# Patient Record
Sex: Female | Born: 1950 | Race: White | Hispanic: No | Smoking: Current some day smoker
Health system: Southern US, Community
[De-identification: ages and names within clinical notes are randomized; demographics above are authoritative.]

## PROBLEM LIST (undated history)

## (undated) DIAGNOSIS — C801 Malignant (primary) neoplasm, unspecified: Secondary | ICD-10-CM

## (undated) DIAGNOSIS — T7840XA Allergy, unspecified, initial encounter: Secondary | ICD-10-CM

## (undated) DIAGNOSIS — K219 Gastro-esophageal reflux disease without esophagitis: Secondary | ICD-10-CM

## (undated) DIAGNOSIS — K5790 Diverticulosis of intestine, part unspecified, without perforation or abscess without bleeding: Secondary | ICD-10-CM

## (undated) DIAGNOSIS — K648 Other hemorrhoids: Secondary | ICD-10-CM

## (undated) DIAGNOSIS — J45909 Unspecified asthma, uncomplicated: Secondary | ICD-10-CM

## (undated) DIAGNOSIS — M1990 Unspecified osteoarthritis, unspecified site: Secondary | ICD-10-CM

## (undated) DIAGNOSIS — K644 Residual hemorrhoidal skin tags: Secondary | ICD-10-CM

## (undated) DIAGNOSIS — E079 Disorder of thyroid, unspecified: Secondary | ICD-10-CM

## (undated) DIAGNOSIS — I700 Atherosclerosis of aorta: Secondary | ICD-10-CM

## (undated) DIAGNOSIS — F329 Major depressive disorder, single episode, unspecified: Secondary | ICD-10-CM

## (undated) DIAGNOSIS — K449 Diaphragmatic hernia without obstruction or gangrene: Secondary | ICD-10-CM

## (undated) DIAGNOSIS — E785 Hyperlipidemia, unspecified: Secondary | ICD-10-CM

## (undated) DIAGNOSIS — Z5189 Encounter for other specified aftercare: Secondary | ICD-10-CM

## (undated) DIAGNOSIS — I10 Essential (primary) hypertension: Secondary | ICD-10-CM

## (undated) DIAGNOSIS — D126 Benign neoplasm of colon, unspecified: Secondary | ICD-10-CM

## (undated) DIAGNOSIS — F32A Depression, unspecified: Secondary | ICD-10-CM

## (undated) DIAGNOSIS — F419 Anxiety disorder, unspecified: Secondary | ICD-10-CM

## (undated) HISTORY — DX: Essential (primary) hypertension: I10

## (undated) HISTORY — DX: Gastro-esophageal reflux disease without esophagitis: K21.9

## (undated) HISTORY — DX: Unspecified osteoarthritis, unspecified site: M19.90

## (undated) HISTORY — DX: Other hemorrhoids: K64.8

## (undated) HISTORY — DX: Residual hemorrhoidal skin tags: K64.4

## (undated) HISTORY — DX: Malignant (primary) neoplasm, unspecified: C80.1

## (undated) HISTORY — DX: Diaphragmatic hernia without obstruction or gangrene: K44.9

## (undated) HISTORY — DX: Disorder of thyroid, unspecified: E07.9

## (undated) HISTORY — DX: Major depressive disorder, single episode, unspecified: F32.9

## (undated) HISTORY — DX: Allergy, unspecified, initial encounter: T78.40XA

## (undated) HISTORY — DX: Hyperlipidemia, unspecified: E78.5

## (undated) HISTORY — DX: Depression, unspecified: F32.A

## (undated) HISTORY — PX: APPENDECTOMY: SHX54

## (undated) HISTORY — DX: Encounter for other specified aftercare: Z51.89

## (undated) HISTORY — DX: Atherosclerosis of aorta: I70.0

## (undated) HISTORY — PX: OTHER SURGICAL HISTORY: SHX169

## (undated) HISTORY — DX: Benign neoplasm of colon, unspecified: D12.6

## (undated) HISTORY — DX: Unspecified asthma, uncomplicated: J45.909

## (undated) HISTORY — DX: Diverticulosis of intestine, part unspecified, without perforation or abscess without bleeding: K57.90

## (undated) HISTORY — DX: Anxiety disorder, unspecified: F41.9

---

## 1973-09-25 HISTORY — PX: CHOLECYSTECTOMY: SHX55

## 1981-09-25 HISTORY — PX: BREAST SURGERY: SHX581

## 1994-09-25 HISTORY — PX: NASAL SINUS SURGERY: SHX719

## 1996-09-25 HISTORY — PX: LIPOSUCTION: SHX10

## 2000-05-14 ENCOUNTER — Inpatient Hospital Stay (HOSPITAL_COMMUNITY): Admission: RE | Admit: 2000-05-14 | Discharge: 2000-05-31 | Payer: Self-pay | Admitting: Plastic Surgery

## 2000-05-14 ENCOUNTER — Encounter (INDEPENDENT_AMBULATORY_CARE_PROVIDER_SITE_OTHER): Payer: Self-pay | Admitting: *Deleted

## 2000-05-18 ENCOUNTER — Encounter: Payer: Self-pay | Admitting: Plastic Surgery

## 2000-05-19 ENCOUNTER — Encounter: Payer: Self-pay | Admitting: Pulmonary Disease

## 2000-05-20 ENCOUNTER — Encounter: Payer: Self-pay | Admitting: Pulmonary Disease

## 2000-05-20 ENCOUNTER — Encounter: Payer: Self-pay | Admitting: Plastic Surgery

## 2000-05-21 ENCOUNTER — Encounter: Payer: Self-pay | Admitting: Pulmonary Disease

## 2000-05-22 ENCOUNTER — Encounter: Payer: Self-pay | Admitting: Pulmonary Disease

## 2000-05-23 ENCOUNTER — Encounter: Payer: Self-pay | Admitting: Pulmonary Disease

## 2000-05-24 ENCOUNTER — Encounter: Payer: Self-pay | Admitting: Pulmonary Disease

## 2000-05-25 ENCOUNTER — Encounter: Payer: Self-pay | Admitting: Pulmonary Disease

## 2000-05-27 ENCOUNTER — Encounter: Payer: Self-pay | Admitting: Pulmonary Disease

## 2000-05-28 ENCOUNTER — Encounter: Payer: Self-pay | Admitting: Pulmonary Disease

## 2000-05-30 ENCOUNTER — Encounter: Payer: Self-pay | Admitting: Pulmonary Disease

## 2000-06-12 ENCOUNTER — Encounter: Admission: RE | Admit: 2000-06-12 | Discharge: 2000-09-03 | Payer: Self-pay | Admitting: Plastic Surgery

## 2000-08-14 ENCOUNTER — Other Ambulatory Visit: Admission: RE | Admit: 2000-08-14 | Discharge: 2000-08-14 | Payer: Self-pay | Admitting: Internal Medicine

## 2001-01-04 ENCOUNTER — Encounter: Payer: Self-pay | Admitting: Plastic Surgery

## 2001-01-04 ENCOUNTER — Ambulatory Visit (HOSPITAL_COMMUNITY): Admission: RE | Admit: 2001-01-04 | Discharge: 2001-01-04 | Payer: Self-pay | Admitting: Plastic Surgery

## 2001-08-14 ENCOUNTER — Ambulatory Visit (HOSPITAL_COMMUNITY): Admission: RE | Admit: 2001-08-14 | Discharge: 2001-08-14 | Payer: Self-pay | Admitting: Internal Medicine

## 2001-08-14 ENCOUNTER — Encounter: Payer: Self-pay | Admitting: Internal Medicine

## 2002-05-01 ENCOUNTER — Ambulatory Visit (HOSPITAL_COMMUNITY): Admission: RE | Admit: 2002-05-01 | Discharge: 2002-05-01 | Payer: Self-pay | Admitting: Internal Medicine

## 2002-05-01 ENCOUNTER — Encounter: Payer: Self-pay | Admitting: Internal Medicine

## 2002-05-30 ENCOUNTER — Emergency Department (HOSPITAL_COMMUNITY): Admission: EM | Admit: 2002-05-30 | Discharge: 2002-05-30 | Payer: Self-pay

## 2003-12-12 ENCOUNTER — Emergency Department (HOSPITAL_COMMUNITY): Admission: EM | Admit: 2003-12-12 | Discharge: 2003-12-12 | Payer: Self-pay | Admitting: Emergency Medicine

## 2006-06-29 ENCOUNTER — Emergency Department (HOSPITAL_COMMUNITY): Admission: EM | Admit: 2006-06-29 | Discharge: 2006-06-29 | Payer: Self-pay | Admitting: Emergency Medicine

## 2006-07-10 ENCOUNTER — Other Ambulatory Visit: Admission: RE | Admit: 2006-07-10 | Discharge: 2006-07-10 | Payer: Self-pay | Admitting: Internal Medicine

## 2006-09-25 HISTORY — PX: BREAST RECONSTRUCTION: SHX9

## 2009-06-22 ENCOUNTER — Emergency Department (HOSPITAL_COMMUNITY): Admission: EM | Admit: 2009-06-22 | Discharge: 2009-06-22 | Payer: Self-pay | Admitting: Emergency Medicine

## 2010-03-31 ENCOUNTER — Encounter: Admission: RE | Admit: 2010-03-31 | Discharge: 2010-03-31 | Payer: Self-pay | Admitting: Internal Medicine

## 2010-12-30 LAB — COMPREHENSIVE METABOLIC PANEL
ALT: 22 U/L (ref 0–35)
AST: 22 U/L (ref 0–37)
Albumin: 3.5 g/dL (ref 3.5–5.2)
Alkaline Phosphatase: 79 U/L (ref 39–117)
BUN: 12 mg/dL (ref 6–23)
CO2: 34 mEq/L — ABNORMAL HIGH (ref 19–32)
Calcium: 9 mg/dL (ref 8.4–10.5)
Chloride: 97 mEq/L (ref 96–112)
Creatinine, Ser: 0.62 mg/dL (ref 0.4–1.2)
GFR calc Af Amer: >60 mL/min (ref >60)
GFR calc non Af Amer: >60 mL/min (ref >60)
Glucose, Bld: 73 mg/dL (ref 70–99)
Potassium: 3 mEq/L — ABNORMAL LOW (ref 3.5–5.1)
Sodium: 139 mEq/L (ref 135–145)
Total Bilirubin: 0.6 mg/dL (ref 0.3–1.2)
Total Protein: 6.8 g/dL (ref 6.0–8.3)

## 2010-12-30 LAB — DIFFERENTIAL
Basophils Absolute: 0 10*3/uL (ref 0.0–0.1)
Basophils Relative: 0 % (ref 0–1)
Eosinophils Absolute: 0.1 10*3/uL (ref 0.0–0.7)
Eosinophils Relative: 2 % (ref 0–5)
Lymphocytes Relative: 40 % (ref 12–46)
Lymphs Abs: 2.8 10*3/uL (ref 0.7–4.0)
Monocytes Absolute: 0.5 10*3/uL (ref 0.1–1.0)
Monocytes Relative: 7 % (ref 3–12)
Neutro Abs: 3.5 10*3/uL (ref 1.7–7.7)
Neutrophils Relative %: 51 % (ref 43–77)

## 2010-12-30 LAB — CK TOTAL AND CKMB (NOT AT ARMC)
CK, MB: 2.2 ng/mL (ref 0.3–4.0)
Relative Index: INVALID (ref 0.0–2.5)
Total CK: 91 U/L (ref 7–177)

## 2010-12-30 LAB — CBC
HCT: 39.3 % (ref 36.0–46.0)
Hemoglobin: 13.3 g/dL (ref 12.0–15.0)
MCHC: 33.8 g/dL (ref 30.0–36.0)
MCV: 91.2 fL (ref 78.0–100.0)
Platelets: 363 10*3/uL (ref 150–400)
RBC: 4.31 MIL/uL (ref 3.87–5.11)
RDW: 13.9 % (ref 11.5–15.5)
WBC: 6.9 10*3/uL (ref 4.0–10.5)

## 2010-12-30 LAB — TROPONIN I: Troponin I: <0.01 ng/mL (ref 0.00–0.06)

## 2010-12-30 LAB — D-DIMER, QUANTITATIVE: D-Dimer, Quant: 0.31 ug/mL-FEU (ref 0.00–0.48)

## 2011-02-07 ENCOUNTER — Other Ambulatory Visit (HOSPITAL_COMMUNITY)
Admission: RE | Admit: 2011-02-07 | Discharge: 2011-02-07 | Disposition: A | Discharge status: Home / Self Care | Payer: Medicare Other | Source: Ambulatory Visit | Attending: Internal Medicine | Admitting: Internal Medicine

## 2011-02-07 ENCOUNTER — Other Ambulatory Visit: Payer: Self-pay | Admitting: Internal Medicine

## 2011-02-07 DIAGNOSIS — R87810 Cervical high risk human papillomavirus (HPV) DNA test positive: Secondary | ICD-10-CM | POA: Insufficient documentation

## 2011-02-07 DIAGNOSIS — Z124 Encounter for screening for malignant neoplasm of cervix: Secondary | ICD-10-CM | POA: Insufficient documentation

## 2011-02-10 NOTE — Op Note (Signed)
[place]  Patient:    Margaret Nelson, Margaret Nelson                       MRN: [id number] Proc. Date: 05/14/00 Adm. Date:  [id number] Attending:  Chapman Moss                           Operative Report  PREOPERATIVE DIAGNOSIS: 1. Mechanical complication of a left breast implant. 2. Acquired absence of the left breast.  POSTOPERATIVE DIAGNOSIS: 1. Mechanical complication of a left breast implant. 2. Acquired absence of the left breast.  OPERATION PERFORMED: 1. Capsulectomy, left chest. 2. Left breast reconstruction using a latissimus dorsi myocutaneous flap as    well as a saline filled implant.  SURGEON:  Teena Irani. Odis Luster, [name]  ASSISTANT:  Janet Berlin. Dan Humphreys, [name]  ANESTHESIA:  General.  ESTIMATED BLOOD LOSS:  100 cc.  DRAINS:  Two Blakes, one chest donor site and one left chest anteriorly.  INDICATIONS FOR PROCEDURE:  This 60 year old woman was referred by another plastic surgeon, who saw her and felt that she had an urgent situation secondary to an implant that was almost exposed.  The patient had undergone a subcutaneous mastectomy approximately 20 years ago and has had multiple chest surgeries for reconstruction.  She had done well since the early 1990s but noticed a bluish discoloration of the skin on the left side.  She saw her physician who referred her to a plastic surgeon, who felt that it was an impending implant extrusion.  She was referred for urgent management.  On examination she was, indeed, found to have a very thin skin cover over an implant. There did not appear to be any muscle cover and minimal subcutaneous tissue cover at that point.  The feeling was that the options would be be to just simply remove her implants, or to try to salvage this side with an urgent capsulectomy and breast reconstruction using a latissimus myocutaneous flap and an implant.  This was discussed with her at length.  She elected to try to salvage  the reconstruction using the latissimus muscle transfer.  She understood the location of the scar, the magnitude of the surgery, the risks and possible complications including tissue loss, flap loss, wound healing problems and the fact that she was at significant increased risk because of her smoking.  The flap breast reconstruction consent form was discussed with her in detail as was the implant consent form.  She understood all of this and wished to proceed.  DESCRIPTION OF PROCEDURE:  The patient was taken to the operating room.  After successful induction of general anesthesia, she was placed in right lateral decubitus position on the bean bag with axillary roll and padding of her knees, ankles and arms.  She was then prepped with Betadine and draped with sterile drapes.  The left chest was approached first by taking out a portion of the skin which was extremely thin and damaged by the underling implant. This having been excised, the implant was then removed. This was a Development worker, international aid silicone gel saline type of implant.  A capsulectomy was then performed as needed both anteriorly as well as superiorly.  Laterally and inferiorly some of the capsule was left in place in order to hold the implant in position. Some capsule was left underneath the nipple itself in order to try to preserve the nipple and avoid nipple loss if possible.  Thorough irrigation with saline, meticulous hemostasis with electrocautery.  The subcutaneous tunnel was then started towards the back again using electrocautery.  This was above the level of the muscle.  Attention was then directed to the left back.  The oblique skin panel had been sketched already to match the skin defect.  This incision was made and dissection carried through the subcutaneous tissues to the underlying latissimus muscle using electrocautery.  The dissection was then performed anteriorly and posteriorly to the limits of the muscle in both direction.   The anterior border having been identified, the dissection was then carried deep to the latissimus muscle.  The latissimus muscle was then dissected free distally and then was divided distally.  The total flap involved the proximal two thirds of the muscle since this was felt to be the more reliable portion of the flap based on the thoracodorsal vessels.  The flap was freed along its posterior origin and lumbar penetrating vessels were ligated with Ligaclips.  Dissected then up the posterior border of the muscle and then lifting the muscle from underneath taking great care to avoid damage to the underlying pedicle.  The serratus branches were actually left intact and were not divided.  The flap was then easily passed through the subcutaneous tunnel to the chest defect where it was temporarily secured using skin staples.  The flap had excellent color, excellent capillary refill with some bright red bleeding around the periphery of the skin paddle.  The donor defect was then thoroughly irrigated with saline.  Meticulous hemostasis was achieved with electrocautery.  A Blake drain was positioned and brought out through a separate stab wound inferiorly as well as anteriorly and secured with a 3-0 Prolene suture.  The closure, 2-0 and 3-0 Vicryl interrupted inverted deep sutures and a running 3-0 Monocryl subcuticular suture.  Steri-Strips and a Tegaderm sterile dressing were then applied after placing a few sterile 4 x 4 gauzes over the wound.  The patient was then turned to a supine position.  She was prepped with Betadine and draped with sterile drapes.  The right breast was also prepped into the field for comparison.  Meticulous hemostasis with electrocautery.  I inspected the pocket.  The pocket was made a little larger superiorly.  The muscle flap insetting 3-0 Vicryl interrupted simple sutures along the inferior border and some were preplaced along the superior border to the  pectoralis edge of the pectoralis muscle.  Again the skin paddle continued to have excellent color and excellent capillary refill.  After thoroughly cleaning  gloves, the implant was then prepared.  This was a McGhan saline filled, round, smooth wall implant.  The style 68, size 650 to 700 cc.  The lot #[id number].  The air was removed.  A total of 450 cc of sterile saline placed using the closed injection system.  The implant was then positioned.  The implant was then inflated to a total of 650 cc of sterile saline using a closed system.  The previously placed sutures which had been left untied along the superior border were then tied securely thus tucking the latissimus muscle up to the border of the pectoralis muscle.  The remainder of the closure medially using 3-0 Vicryl sutures, horizontal mattress, again taking  great care to avoid damage to the underlying implant.  Irrigation with saline and the skin paddle inset with 3-0 Vicryl interrupted inverted deep sutures.  Skin paddle continued to have excellent color and excellent capillary refill.  The Steri-Strips, dry  sterile dressings were lightly applied.  She was transported to the recovery room in stable condition having tolerated the procedure well. D:  05/14/00 TD:  05/15/00 Job: [id number]

## 2011-02-10 NOTE — Consult Note (Signed)
[place]  Patient:    Margaret Nelson, Margaret Nelson                       MRN: [id number] Proc. Date: 05/18/00 Adm. Date:  [id number] Attending:  Chapman Moss CC:         Teena Irani. Odis Luster, [name]   Consultation Report  DATE OF BIRTH:  Sep 15, 1951  REFERRING PHYSICIAN:  Teena Irani. Odis Luster, [name]  REASON FOR CONSULTATION:  Evaluation of bilateral pneumonia.  HISTORY OF PRESENT ILLNESS:  This 60 year old Caucasian female who was admitted on August 20 for breast reconstruction after mechanical complications of left breast prosthesis. The patient underwent left capsulectomy with left breast reconstruction and latissimus flap implant by Teena Irani. Odis Luster, [name] on the 20th. Per the anesthesia notes, the patient underwent two attempts at intubation via direct visualization, but no aspiration was reported. Today, the patient was to start ambulation after her surgery, and she stated she felt very weak and dyspneic and could not go on. O2 saturations on room air were checked, and these were 88%.  The patient had a chest x-ray done today which showed bilateral infiltrates consistent with a pneumonia. Last evening, she had spiked a temperature to 102.7. Apparently, the patient denies any dyspnea. Has occasional bouts of being "wheezy." She does have difficulties with cough which is nonproductive, though she feels she has some congestion "deep in her lungs."  The patient states that she is a smoker. Smokes approximately a half of a pack of cigarettes per day. Has done so for many years. She quit two weeks prior to the surgery. She is known to have difficulties with recurrent bronchitis throughout the year. Normally, does not use inhalers, except when she has a bout of bronchitis.  PAST MEDICAL HISTORY:  Remarkable for "fluid retention" for which the patient takes amiloride. The patient also has a history of anxiety and depression for which she is on Prozac. She  also has a history of gastroesophageal reflux for which she is on Prilosec. She has had numerous breast surgeries due to dense fibroid tissue noted in the past. The patient denies any other chronic medical illnesses. She does have the episodes of recurrent bronchitis. She states that she was "sickly" as a child and has asthma.  SOCIAL HISTORY:  She is divorced. Smokes half of a pack of cigarettes per day. Has done so for many years. She has no significant occupational exposure and works as a Production designer, theatre/television/film for a [employer], broadcasting/film/video.  ALLERGIES:  CODEINE and PENICILLIN which cause her to have hives and/or swelling.  CURRENT MEDICATIONS:  Clindamycin, amiloride, Protonix, Prozac, and MS-Contin for pain. In addition, the patient is on p.r.n. Phenergan and p.r.n. laxative of choice.  REVIEW OF SYSTEMS:  As noted above. Only complaint patient has other than that is not being able to sleep well at night while in the hospital.  PHYSICAL EXAMINATION:  GENERAL:  Well-developed, well-nourished, Caucasian female who is in no acute distress. She is speaking sentences completely and without having to pause for a breath. She does not appear toxic.  VITAL SIGNS:  Temperature is 100.2, tmax was 102.7 last evening; pulse is 108; blood pressure is 87/43, of note is the patient runs low blood pressures noted throughout her vitals; respirations are 18; O2 saturations on 3 L nasal cannula are 93%.  HEENT:  Unremarkable.  NECK:  Supple. No adenopathy noted.  LUNGS:  Bibasilar crackles which are  dense and show early consolidation. In addition, the patient has high-pitched wheezes on occasion.  CARDIAC:  Slightly tachycardic but regular rate.  ABDOMEN:  Benign.  CHEST:  Obvious postop changes from the breast reconstruction done earlier. The patient also has a bandage over the latissimus area.  EXTREMITIES:  No edema. The patient has signs of an infiltrated IV on the right  hand.  LABORATORY DATA:  No labs obtained today.  Chest x-ray shows bilateral infiltrates consistent with pneumonitis.  IMPRESSION: 1. Bilateral pneumonitis postoperative in patient with known smoking history.    This is a known complication postoperatively in smokers, though the patient    quit two weeks prior to surgery. The ideal time for better mucociliary    clearance is eight weeks of discontinuation prior to surgery. 2. Bronchospasm related to #1. Possibly also related to chronic bronchitis. 3. Chronic bronchitis by history.  PLAN: 1. Continue supplemental O2. 2. Continue pulmonary toilet. Will adjust nebulizers to albuterol 2.5 mg via    hand-held nebulizers q.6h. around the clock. We will discontinue Atrovent    at present as this may dry her secretions even further, and we want her to    expectorate. I will also use flutter valve after each nebulizer treatment. 3. I will add Tequin to her current antibiotic regimen for better coverage,    particularly in view of the fact that she is allergic to penicillin, and we    need to avoid this type of antibiotic. 4. Follow oxygen saturations, as well as chest x-rays and also follow patient    for clinical improvement or worsening. 5. We will discontinue amiloride for present and try to encourage p.o. fluids    as the patient needs to expectorate. 6. Add a mucolytic agent in the form of Humibid L.A. two tablets p.o. b.i.d.    with meals. 7. The patient does have some evidence of bronchospasm. Will add Solu-Medrol    x 6 doses, as well as use nebulizer treatments. 8. Further plans pending patients response to the above.  Thank you very much for the consult. We will continue to follow with you and monitor the patients progress to the above treatment. DD:  05/18/00 TD:  05/19/00 Job: [id number]

## 2011-02-10 NOTE — Discharge Summary (Signed)
[place]  Patient:    Margaret Nelson, Margaret Nelson                       MRN: [id number] Adm. Date:  [id number] Disc. Date: [id number] Attending:  Chapman Moss                           Discharge Summary  FINAL DIAGNOSIS:  Mechanical complication of a left breast implant with acquired absence of the left breast, and a bilateral pneumonia with adult respiratory distress syndrome.  PROCEDURE PERFORMED: 1. Capsulectomy, left breast. 2. Left breast reconstruction using a latissimus dorsi myocutaneous flap, as    well as a saline filled implant.  HISTORY OF PRESENT ILLNESS:  A 60 year old woman was referred by another plastic surgeon for a fairly urgent situation secondary to a left breast implant placed for a left breast reconstruction that was almost exposed.  Her skin had become paper thin over the implant.  The implant was clearly visible just beneath a very thin layer of dermis and epidermis.  The situation was discussed with the patient who did wish to proceed with reconstruction.  The option presented to her was to remove the implants on both sides and just leave them out, or have a fairly extensive reconstruction procedure using a left latissimus dorsi myocutaneous flap followed by placement of implant underneath.  She elected to have the reconstruction, rather then just simply removing her implants.  The nature of this procedure, the risks, and possible complications were discussed with her in great detail, and she understood all of this and wished to proceed.  She understood that she was at increased risk for tissue loss, as well as complications due to her smoking history.  For further details of her history and physical, please see the chart.  HOSPITAL COURSE:  On admission she was taken to surgery at which time capsulectomy left chest and left myocutaneous latissimus dorsi flap was placed and implants were performed.  She tolerated that  procedure well. Postoperatively, she did well initially.  She was afebrile for a couple of days, but then several days after the procedure she did have a significant temperature spike.  This was concerning, and blood cultures were taken, as well as sputum cultures, all of which were negative.  She continued to have difficulties, and was having a great deal of difficulty coughing up her secretions, and had the subjective feeling that she was short of breath.  A pulmonary consultation was obtained.  Her flap continued to look very healthy, but the concern at this point was the significant amount of trouble she was having with mobilizing secretions.  This was on the fourth postoperative day. A pulmonary consultation was obtained.  The chest x-ray showed some bilateral infiltrates consistent with a possible pneumonia, and given the temperature spike of 102.7, this was felt to be a possibility.  At this point, on May 18, 2000, the fourth postoperative day, she was denying any dyspnea.  She felt that she had congestion deep in her lungs.  The impression on the part of the pulmonary service was that she had bilateral pneumonitis postoperatively with some bronchospasm and chronic bronchitis by history, but really no evidence of any established pneumonia at this point.  She was continued on supplemental O2, continued on vigorous pulmonary hygiene, continued on following oxygen saturations.  Solu-Medrol was also begun.  At this point, the pulmonary service  was managing her care.  The following day she continued to have some further worsening in terms of her overall pulmonary status.  She continued to feel as though she was not coughing up enough of her secretions.  She had some desaturation during the night, and O2 was being delivered via aerosol mask.  She was transferred to the step-down unit.  She was started on Cleocin and Tequin for the time being, with the idea that she might very well have  pneumonia.  She continued on a very complicated course.  Her respiratory distress worsened by the following day, May 20, 2000, and she was transferred to the intensive care unit.  Her picture had become increasingly one of adult respiratory distress syndrome at this point.  It was felt that it was possibly due to pneumonia, although it was not documented.  She was started on the concominate ______ protein C study, and she responded to this very well.  She was also put on the rotating bed as well for secretions.  She was intubated and was in the intensive care unit for close to a week.  Finally, she began to improve, and gradually did respond very nicely.  She was transferred to the subacute unit and continued to make steady progress.  Throughout this, her muscle flap maintained excellent color and capillary refill, and did survive this very difficult clinical course.  Also donor site in her back also continued to do very well.  Drains were all removed.  Gradually she continued to make progress, and her pulmonary physicians felt that she could be discharged.  A CT scan was performed prior to discharge, since she apparently had a little bit of confusion several days prior to discharge.  This had improved greatly at the time of the CT scan, but nevertheless CT scan was ordered by the pulmonary service.  Apparently, this was negative.  DISPOSITION:  She was off all antibiotics at this point.  She was on pulmonary care per the pulmonary service.  She was encouraged not to smoke. Arrangements were made for outpatient physical therapy to help with strengthening, range of motion, and rehabilitation.  She would be followed up in my office in two weeks, and followed up by pulmonary service per their instructions. DD:  06/22/00 TD:  06/22/00 Job: [id number]

## 2011-12-01 DIAGNOSIS — J0100 Acute maxillary sinusitis, unspecified: Secondary | ICD-10-CM | POA: Diagnosis not present

## 2011-12-15 DIAGNOSIS — K644 Residual hemorrhoidal skin tags: Secondary | ICD-10-CM | POA: Diagnosis not present

## 2011-12-15 DIAGNOSIS — W458XXA Other foreign body or object entering through skin, initial encounter: Secondary | ICD-10-CM | POA: Diagnosis not present

## 2011-12-15 DIAGNOSIS — R5381 Other malaise: Secondary | ICD-10-CM | POA: Diagnosis not present

## 2011-12-15 DIAGNOSIS — E039 Hypothyroidism, unspecified: Secondary | ICD-10-CM | POA: Diagnosis not present

## 2011-12-15 DIAGNOSIS — R5383 Other fatigue: Secondary | ICD-10-CM | POA: Diagnosis not present

## 2012-01-11 DIAGNOSIS — I10 Essential (primary) hypertension: Secondary | ICD-10-CM | POA: Diagnosis not present

## 2012-01-11 DIAGNOSIS — J209 Acute bronchitis, unspecified: Secondary | ICD-10-CM | POA: Diagnosis not present

## 2012-02-05 DIAGNOSIS — J40 Bronchitis, not specified as acute or chronic: Secondary | ICD-10-CM | POA: Diagnosis not present

## 2012-02-08 DIAGNOSIS — I10 Essential (primary) hypertension: Secondary | ICD-10-CM | POA: Diagnosis not present

## 2012-02-08 DIAGNOSIS — R7309 Other abnormal glucose: Secondary | ICD-10-CM | POA: Diagnosis not present

## 2012-02-08 DIAGNOSIS — Z1212 Encounter for screening for malignant neoplasm of rectum: Secondary | ICD-10-CM | POA: Diagnosis not present

## 2012-02-08 DIAGNOSIS — Z79899 Other long term (current) drug therapy: Secondary | ICD-10-CM | POA: Diagnosis not present

## 2012-02-08 DIAGNOSIS — N3000 Acute cystitis without hematuria: Secondary | ICD-10-CM | POA: Diagnosis not present

## 2012-02-08 DIAGNOSIS — E782 Mixed hyperlipidemia: Secondary | ICD-10-CM | POA: Diagnosis not present

## 2012-02-08 DIAGNOSIS — E559 Vitamin D deficiency, unspecified: Secondary | ICD-10-CM | POA: Diagnosis not present

## 2012-02-28 DIAGNOSIS — M25519 Pain in unspecified shoulder: Secondary | ICD-10-CM | POA: Diagnosis not present

## 2012-02-28 DIAGNOSIS — M542 Cervicalgia: Secondary | ICD-10-CM | POA: Diagnosis not present

## 2012-04-03 DIAGNOSIS — H43819 Vitreous degeneration, unspecified eye: Secondary | ICD-10-CM | POA: Diagnosis not present

## 2012-04-03 DIAGNOSIS — H04129 Dry eye syndrome of unspecified lacrimal gland: Secondary | ICD-10-CM | POA: Diagnosis not present

## 2012-04-24 DIAGNOSIS — M79609 Pain in unspecified limb: Secondary | ICD-10-CM | POA: Diagnosis not present

## 2012-04-24 DIAGNOSIS — Z79899 Other long term (current) drug therapy: Secondary | ICD-10-CM | POA: Diagnosis not present

## 2012-04-24 DIAGNOSIS — J0100 Acute maxillary sinusitis, unspecified: Secondary | ICD-10-CM | POA: Diagnosis not present

## 2012-04-24 DIAGNOSIS — N3000 Acute cystitis without hematuria: Secondary | ICD-10-CM | POA: Diagnosis not present

## 2012-07-05 DIAGNOSIS — E039 Hypothyroidism, unspecified: Secondary | ICD-10-CM | POA: Diagnosis not present

## 2012-07-05 DIAGNOSIS — Z79899 Other long term (current) drug therapy: Secondary | ICD-10-CM | POA: Diagnosis not present

## 2012-07-05 DIAGNOSIS — R5381 Other malaise: Secondary | ICD-10-CM | POA: Diagnosis not present

## 2012-07-05 DIAGNOSIS — E559 Vitamin D deficiency, unspecified: Secondary | ICD-10-CM | POA: Diagnosis not present

## 2012-07-05 DIAGNOSIS — N3000 Acute cystitis without hematuria: Secondary | ICD-10-CM | POA: Diagnosis not present

## 2012-07-05 DIAGNOSIS — R5383 Other fatigue: Secondary | ICD-10-CM | POA: Diagnosis not present

## 2012-07-09 ENCOUNTER — Encounter: Payer: Self-pay | Admitting: Internal Medicine

## 2012-08-01 DIAGNOSIS — Z23 Encounter for immunization: Secondary | ICD-10-CM | POA: Diagnosis not present

## 2012-08-14 ENCOUNTER — Encounter: Payer: Self-pay | Admitting: Internal Medicine

## 2012-08-15 ENCOUNTER — Ambulatory Visit: Payer: Medicare Other | Admitting: Internal Medicine

## 2012-12-17 DIAGNOSIS — W458XXA Other foreign body or object entering through skin, initial encounter: Secondary | ICD-10-CM | POA: Diagnosis not present

## 2013-01-08 DIAGNOSIS — I10 Essential (primary) hypertension: Secondary | ICD-10-CM | POA: Diagnosis not present

## 2013-01-08 DIAGNOSIS — B079 Viral wart, unspecified: Secondary | ICD-10-CM | POA: Diagnosis not present

## 2013-02-10 DIAGNOSIS — Z1212 Encounter for screening for malignant neoplasm of rectum: Secondary | ICD-10-CM | POA: Diagnosis not present

## 2013-02-10 DIAGNOSIS — E782 Mixed hyperlipidemia: Secondary | ICD-10-CM | POA: Diagnosis not present

## 2013-02-10 DIAGNOSIS — I10 Essential (primary) hypertension: Secondary | ICD-10-CM | POA: Diagnosis not present

## 2013-02-10 DIAGNOSIS — E559 Vitamin D deficiency, unspecified: Secondary | ICD-10-CM | POA: Diagnosis not present

## 2013-02-10 DIAGNOSIS — R7309 Other abnormal glucose: Secondary | ICD-10-CM | POA: Diagnosis not present

## 2013-02-10 DIAGNOSIS — E538 Deficiency of other specified B group vitamins: Secondary | ICD-10-CM | POA: Diagnosis not present

## 2013-02-10 DIAGNOSIS — N3000 Acute cystitis without hematuria: Secondary | ICD-10-CM | POA: Diagnosis not present

## 2013-02-10 DIAGNOSIS — Z79899 Other long term (current) drug therapy: Secondary | ICD-10-CM | POA: Diagnosis not present

## 2013-02-11 ENCOUNTER — Encounter: Payer: Self-pay | Admitting: Internal Medicine

## 2013-02-12 ENCOUNTER — Other Ambulatory Visit: Payer: Self-pay | Admitting: Internal Medicine

## 2013-02-12 DIAGNOSIS — Z9882 Breast implant status: Secondary | ICD-10-CM

## 2013-02-12 DIAGNOSIS — N6451 Induration of breast: Secondary | ICD-10-CM

## 2013-02-12 DIAGNOSIS — Z9013 Acquired absence of bilateral breasts and nipples: Secondary | ICD-10-CM

## 2013-03-11 ENCOUNTER — Other Ambulatory Visit: Payer: Medicare Other

## 2013-03-20 ENCOUNTER — Encounter: Payer: Medicare Other | Admitting: Internal Medicine

## 2013-04-16 DIAGNOSIS — E039 Hypothyroidism, unspecified: Secondary | ICD-10-CM | POA: Diagnosis not present

## 2013-04-16 DIAGNOSIS — M79609 Pain in unspecified limb: Secondary | ICD-10-CM | POA: Diagnosis not present

## 2013-04-16 DIAGNOSIS — E782 Mixed hyperlipidemia: Secondary | ICD-10-CM | POA: Diagnosis not present

## 2013-04-16 DIAGNOSIS — R7309 Other abnormal glucose: Secondary | ICD-10-CM | POA: Diagnosis not present

## 2013-04-16 DIAGNOSIS — I10 Essential (primary) hypertension: Secondary | ICD-10-CM | POA: Diagnosis not present

## 2013-04-16 DIAGNOSIS — E538 Deficiency of other specified B group vitamins: Secondary | ICD-10-CM | POA: Diagnosis not present

## 2013-04-16 DIAGNOSIS — Z79899 Other long term (current) drug therapy: Secondary | ICD-10-CM | POA: Diagnosis not present

## 2013-05-07 ENCOUNTER — Encounter: Payer: Medicare Other | Admitting: Internal Medicine

## 2013-05-15 DIAGNOSIS — M545 Low back pain, unspecified: Secondary | ICD-10-CM | POA: Diagnosis not present

## 2013-06-04 ENCOUNTER — Telehealth: Payer: Self-pay | Admitting: *Deleted

## 2013-06-04 NOTE — Telephone Encounter (Signed)
Called and left message pt no showed for pre visit today. Pt never returned call to RS so PV and procedure cancelled and letter mailed for pt to call and RS. ewm

## 2013-06-12 ENCOUNTER — Encounter: Payer: Self-pay | Admitting: Internal Medicine

## 2013-06-12 ENCOUNTER — Encounter: Payer: Medicare Other | Admitting: Internal Medicine

## 2013-06-18 ENCOUNTER — Encounter: Payer: Medicare Other | Admitting: Internal Medicine

## 2013-06-18 DIAGNOSIS — H2510 Age-related nuclear cataract, unspecified eye: Secondary | ICD-10-CM | POA: Diagnosis not present

## 2013-06-18 DIAGNOSIS — H43819 Vitreous degeneration, unspecified eye: Secondary | ICD-10-CM | POA: Diagnosis not present

## 2013-08-16 ENCOUNTER — Other Ambulatory Visit: Payer: Self-pay | Admitting: Internal Medicine

## 2013-08-18 ENCOUNTER — Ambulatory Visit: Payer: Medicare Other | Admitting: Internal Medicine

## 2013-08-18 ENCOUNTER — Encounter: Payer: Self-pay | Admitting: Internal Medicine

## 2013-08-18 VITALS — BP 106/60 | HR 72 | Temp 98.1°F | Resp 16 | Wt 138.0 lb

## 2013-08-18 DIAGNOSIS — E782 Mixed hyperlipidemia: Secondary | ICD-10-CM | POA: Insufficient documentation

## 2013-08-18 DIAGNOSIS — Z79899 Other long term (current) drug therapy: Secondary | ICD-10-CM

## 2013-08-18 DIAGNOSIS — F3341 Major depressive disorder, recurrent, in partial remission: Secondary | ICD-10-CM | POA: Insufficient documentation

## 2013-08-18 DIAGNOSIS — I10 Essential (primary) hypertension: Secondary | ICD-10-CM | POA: Diagnosis not present

## 2013-08-18 DIAGNOSIS — E559 Vitamin D deficiency, unspecified: Secondary | ICD-10-CM

## 2013-08-18 DIAGNOSIS — R7303 Prediabetes: Secondary | ICD-10-CM | POA: Insufficient documentation

## 2013-08-18 DIAGNOSIS — R7309 Other abnormal glucose: Secondary | ICD-10-CM

## 2013-08-18 DIAGNOSIS — F332 Major depressive disorder, recurrent severe without psychotic features: Secondary | ICD-10-CM

## 2013-08-18 MED ORDER — BUMETANIDE 2 MG PO TABS: 2.0000 mg | ORAL_TABLET | Freq: Two times a day (BID) | ORAL | Status: DC

## 2013-08-18 MED ORDER — SERTRALINE HCL 100 MG PO TABS: 100.0000 mg | ORAL_TABLET | Freq: Two times a day (BID) | ORAL | Status: DC

## 2013-08-18 MED ORDER — AMPHETAMINE-DEXTROAMPHETAMINE 20 MG PO TABS: ORAL_TABLET | ORAL | Status: DC

## 2013-08-18 NOTE — Progress Notes (Signed)
Patient ID: Margaret Nelson, female   DOB: 1951/09/20, 62 y.o.   MRN: [id number]  This very nice 62 yo SWF presents for  follow up and med review with hx/o  hypertension, hypothyroidism, hx/o severe endogenous depression, hyperlipidemia, pre-diabetes and vitamin D deficiency.    BP has been controlled at home. Today's BP is 106/60. Patient denies any cardiac type chest pain, palpitations, dyspnea/orthopnea/PND, dizziness, claudication, or dependent edema.   Hyperlipidemia is controlled with diet & meds. Last cholesterol was 235 , Triglycerides were 445 and patient was advised to take her Atorvastatin 80 mg X 1/2 tab = 40 mg qd. Patient denies myalgias or other med SE's.    Also, the patient has history of prediabetes/insulin resistance with last A1c of 5.3%. Patient denies any symptoms of reactive hypoglycemia, diabetic polys, paresthesias or visual blurring.   Patient has hx/o severe depression followed in the past by Dr Evelene Croon who has developed her psyche med regimens that patient has been fairly stable on for a number of years.   Further, Patient has history of vitamin D deficiency with last vitamin D of 48 in May. Patient supplements vitamin without any suspected side-effects.      Allergies  Allergen Reactions  . Codeine Other (See Comments)    Abdominal pain  . Penicillins Rash    PMHx: Reviewed.  FHx:    Reviewed / unchanged  SHx:    Reviewed / unchanged  Systems Review: Constitutional: Denies fever, chills, wt changes, headaches, insomnia, fatigue, night sweats, change in appetite. Eyes: Denies redness, blurred vision, diplopia, discharge, itchy, watery eyes.  ENT: Denies discharge, congestion, post nasal drip, epistaxis, sore throat, earache, hearing loss, dental pain, tinnitus, vertigo, sinus pain, snoring.  CV: Denies chest pain, palpitations, irregular heartbeat, syncope, dyspnea, diaphoresis, orthopnea, PND, claudication, edema. Respiratory: denies cough, dyspnea, DOE,  pleurisy, hoarseness, laryngitis, wheezing.  Gastrointestinal: Denies dysphagia, odynophagia, heartburn, reflux, water brash, abdominal pain or cramps, nausea, vomiting, bloating, diarrhea, constipation, hematemesis, melena, hematochezia,  Hemorrhoids. Genitourinary: Denies dysuria, frequency, urgency, nocturia, hesitancy, discharge, hematuria, flank pain. Musculoskeletal: Denies arthralgias, myalgias, stiffness, jt. swelling, pain, limp, strain/sprain.  Skin: Denies pruritus, rash, hives, warts, acne, eczema, change in skin lesion(s). Neuro: No weakness, tremor, incoordination, spasms, paresthesia, or pain. Psychiatric: Denies confusion, memory loss, or sensory loss. Endo: Denies change in weight, skin, hair change.  Heme/Lymph: No excessive bleeding, bruising, orenlarged lymph nodes.  Filed Vitals:   08/18/13 1646  BP: 106/60  Pulse: 72  Temp: 98.1 F (36.7 C)  Resp: 16      On Exam: Appears well nourished - in no distress. Eyes: PERRLA, EOMs, conjunctiva no swelling or erythema. Sinuses: No frontal/maxillary tenderness ENT/Mouth: EAC's clear, TM's nl w/o erythema, bulging. Nares clear w/o erythema, swelling, exudates. Oropharynx clear without erythema or exudates. Oral hygiene is good. Tongue normal, non obstructing. Hearing intact.  Neck: Supple. Thyroid nl. Car 2+/2+ without bruits, nodes or JVD. Chest: Respirations nl with BS clear & equal w/o rales, rhonchi, wheezing.  Cor: Heart sounds normal w/ regular rate and rhythm without sig. murmurs, gallops, clicks, or rubs and without edema.  Abdomen:Benign. Musculoskeletal: Full ROM all peripheral extremities and normal gait.  Skin: Warm, dry without exposed rashes, lesions, ecchymosis apparent.  Neuro: Cranial nerves intact, reflexes equal bilaterally. Sensory-motor testing grossly intact. Tendon reflexes grossly intact.  Pysch: Alert & oriented x 3. Insight and judgement nl & appropriate. No ideations.  Assessment and  Plan:  1. Hypertension - Continue monitor blood pressure at home. Continue  diet/meds same.  2. Hyperlipidemia - Continue diet/meds, exercise,& lifestyle modifications. Continue monitor periodic cholesterol/liver & renal functions   3. Pre-diabetes - Continue diet, exercise, lifestyle modifications. Monitor appropriate labs.  4. Vitamin D Deficiency - Continue supplementation.  Further disposition pending results of labs.

## 2013-08-18 NOTE — Patient Instructions (Signed)
Continue diet & medications same as discussed.   Further disposition pending lab results.       Edema Edema is an abnormal build-up of fluids in tissues. Because this is partly dependent on gravity (water flows to the lowest place), it is more common in the legs and thighs (lower extremities). It is also common in the looser tissues, like around the eyes. Painless swelling of the feet and ankles is common and increases as a person ages. It may affect both legs and may include the calves or even thighs. When squeezed, the fluid may move out of the affected area and may leave a dent for a few moments. CAUSES   Prolonged standing or sitting in one place for extended periods of time. Movement helps pump tissue fluid into the veins, and absence of movement prevents this, resulting in edema.  Varicose veins. The valves in the veins do not work as well as they should. This causes fluid to leak into the tissues.  Fluid and salt overload.  Injury, burn, or surgery to the leg, ankle, or foot, may damage veins and allow fluid to leak out.  Sunburn damages vessels. Leaky vessels allow fluid to go out into the sunburned tissues.  Allergies (from insect bites or stings, medications or chemicals) cause swelling by allowing vessels to become leaky.  Protein in the blood helps keep fluid in your vessels. Low protein, as in malnutrition, allows fluid to leak out.  Hormonal changes, including pregnancy and menstruation, cause fluid retention. This fluid may leak out of vessels and cause edema.  Medications that cause fluid retention. Examples are sex hormones, blood pressure medications, steroid treatment, or anti-depressants.  Some illnesses cause edema, especially heart failure, kidney disease, or liver disease.  Surgery that cuts veins or lymph nodes, such as surgery done for the heart or for breast cancer, may result in edema. DIAGNOSIS  Your caregiver is usually easily able to determine what  is causing your swelling (edema) by simply asking what is wrong (getting a history) and examining you (doing a physical). Sometimes x-rays, EKG (electrocardiogram or heart tracing), and blood work may be done to evaluate for underlying medical illness. TREATMENT  General treatment includes:  Leg elevation (or elevation of the affected body part).  Restriction of fluid intake.  Prevention of fluid overload.  Compression of the affected body part. Compression with elastic bandages or support stockings squeezes the tissues, preventing fluid from entering and forcing it back into the blood vessels.  Diuretics (also called water pills or fluid pills) pull fluid out of your body in the form of increased urination. These are effective in reducing the swelling, but can have side effects and must be used only under your caregiver's supervision. Diuretics are appropriate only for some types of edema. The specific treatment can be directed at any underlying causes discovered. Heart, liver, or kidney disease should be treated appropriately. HOME CARE INSTRUCTIONS   Elevate the legs (or affected body part) above the level of the heart, while lying down.  Avoid sitting or standing still for prolonged periods of time.  Avoid putting anything directly under the knees when lying down, and do not wear constricting clothing or garters on the upper legs.  Exercising the legs causes the fluid to work back into the veins and lymphatic channels. This may help the swelling go down.  The pressure applied by elastic bandages or support stockings can help reduce ankle swelling.  A low-salt diet may help reduce  fluid retention and decrease the ankle swelling.  Take any medications exactly as prescribed. SEEK MEDICAL CARE IF:  Your edema is not responding to recommended treatments. SEEK IMMEDIATE MEDICAL CARE IF:   You develop shortness of breath or chest pain.  You cannot breathe when you lay down; or if,  while lying down, you have to get up and go to the window to get your breath.  You are having increasing swelling without relief from treatment.  You develop a fever over 102 F (38.9 C).  You develop pain or redness in the areas that are swollen.  Tell your caregiver right away if you have gained 03 lb/1.4 kg in 1 day or 05 lb/2.3 kg in a week. MAKE SURE YOU:   Understand these instructions.  Will watch your condition.  Will get help right away if you are not doing well or get worse. Document Released: 09/11/2005 Document Revised: 03/12/2012 Document Reviewed: 04/29/2008 [facility] Patient Information 2014 [place], [place]. Hypertension As your heart beats, it forces blood through your arteries. This force is your blood pressure. If the pressure is too high, it is called hypertension (HTN) or high blood pressure. HTN is dangerous because you may have it and not know it. High blood pressure may mean that your heart has to work harder to pump blood. Your arteries may be narrow or stiff. The extra work puts you at risk for heart disease, stroke, and other problems.  Blood pressure consists of two numbers, a higher number over a lower, 110/72, for example. It is stated as "110 over 72." The ideal is below 120 for the top number (systolic) and under 80 for the bottom (diastolic). Write down your blood pressure today. You should pay close attention to your blood pressure if you have certain conditions such as:  Heart failure.  Prior heart attack.  Diabetes  Chronic kidney disease.  Prior stroke.  Multiple risk factors for heart disease. To see if you have HTN, your blood pressure should be measured while you are seated with your arm held at the level of the heart. It should be measured at least twice. A one-time elevated blood pressure reading (especially in the Emergency Department) does not mean that you need treatment. There may be conditions in which the blood pressure is different  between your right and left arms. It is important to see your caregiver soon for a recheck. Most people have essential hypertension which means that there is not a specific cause. This type of high blood pressure may be lowered by changing lifestyle factors such as:  Stress.  Smoking.  Lack of exercise.  Excessive weight.  Drug/tobacco/alcohol use.  Eating less salt. Most people do not have symptoms from high blood pressure until it has caused damage to the body. Effective treatment can often prevent, delay or reduce that damage. TREATMENT  When a cause has been identified, treatment for high blood pressure is directed at the cause. There are a large number of medications to treat HTN. These fall into several categories, and your caregiver will help you select the medicines that are best for you. Medications may have side effects. You should review side effects with your caregiver. If your blood pressure stays high after you have made lifestyle changes or started on medicines,   Your medication(s) may need to be changed.  Other problems may need to be addressed.  Be certain you understand your prescriptions, and know how and when to take your medicine.  Be sure  to follow up with your caregiver within the time frame advised (usually within two weeks) to have your blood pressure rechecked and to review your medications.  If you are taking more than one medicine to lower your blood pressure, make sure you know how and at what times they should be taken. Taking two medicines at the same time can result in blood pressure that is too low. SEEK IMMEDIATE MEDICAL CARE IF:  You develop a severe headache, blurred or changing vision, or confusion.  You have unusual weakness or numbness, or a faint feeling.  You have severe chest or abdominal pain, vomiting, or breathing problems. MAKE SURE YOU:   Understand these instructions.  Will watch your condition.  Will get help right away if you  are not doing well or get worse. Document Released: 09/11/2005 Document Revised: 12/04/2011 Document Reviewed: 05/01/2008 [facility] Patient Information 2014 [place], [place].   Cholesterol Cholesterol is a white, waxy, fat-like protein needed by your body in small amounts. The liver makes all the cholesterol you need. It is carried from the liver by the blood through the blood vessels. Deposits (plaque) may build up on blood vessel walls. This makes the arteries narrower and stiffer. Plaque increases the risk for heart attack and stroke. You cannot feel your cholesterol level even if it is very high. The only way to know is by a blood test to check your lipid (fats) levels. Once you know your cholesterol levels, you should keep a record of the test results. Work with your caregiver to to keep your levels in the desired range. WHAT THE RESULTS MEAN:  Total cholesterol is a rough measure of all the cholesterol in your blood.  LDL is the so-called bad cholesterol. This is the type that deposits cholesterol in the walls of the arteries. You want this level to be low.  HDL is the good cholesterol because it cleans the arteries and carries the LDL away. You want this level to be high.  Triglycerides are fat that the body can either burn for energy or store. High levels are closely linked to heart disease. DESIRED LEVELS:  Total cholesterol below 200.  LDL below 100 for people at risk, below 70 for very high risk.  HDL above 50 is good, above 60 is best.  Triglycerides below 150. HOW TO LOWER YOUR CHOLESTEROL:  Diet.  Choose fish or white meat chicken and [place], roasted or baked. Limit fatty cuts of red meat, fried foods, and processed meats, such as sausage and lunch meat.  Eat lots of fresh fruits and vegetables. Choose whole grains, beans, pasta, potatoes and cereals.  Use only small amounts of olive, corn or canola oils. Avoid butter, mayonnaise, shortening or palm kernel oils. Avoid  foods with trans-fats.  Use skim/nonfat milk and low-fat/nonfat yogurt and cheeses. Avoid whole milk, cream, ice cream, egg yolks and cheeses. Healthy desserts include angel food cake, ginger snaps, animal crackers, hard candy, popsicles, and low-fat/nonfat frozen yogurt. Avoid pastries, cakes, pies and cookies.  Exercise.  A regular program helps decrease LDL and raises HDL.  Helps with weight control.  Do things that increase your activity level like gardening, walking, or taking the stairs.  Medication.  May be prescribed by your caregiver to help lowering cholesterol and the risk for heart disease.  You may need medicine even if your levels are normal if you have several risk factors. HOME CARE INSTRUCTIONS   Follow your diet and exercise programs as suggested by your caregiver.  Take medications as directed.  Have blood work done when your caregiver feels it is necessary. MAKE SURE YOU:   Understand these instructions.  Will watch your condition.  Will get help right away if you are not doing well or get worse. Document Released: 06/06/2001 Document Revised: 12/04/2011 Document Reviewed: 11/27/2007 [facility] Patient Information 2014 [place], [place].

## 2013-08-19 ENCOUNTER — Telehealth: Payer: Self-pay | Admitting: *Deleted

## 2013-08-19 ENCOUNTER — Other Ambulatory Visit: Payer: Self-pay | Admitting: Internal Medicine

## 2013-08-19 DIAGNOSIS — E876 Hypokalemia: Secondary | ICD-10-CM

## 2013-08-19 LAB — HEPATIC FUNCTION PANEL
ALT: 19 U/L (ref 0–35)
AST: 18 U/L (ref 0–37)
Albumin: 4.1 g/dL (ref 3.5–5.2)
Alkaline Phosphatase: 76 U/L (ref 39–117)
Bilirubin, Direct: <0.1 mg/dL (ref 0.0–0.3)
Total Bilirubin: 0.3 mg/dL (ref 0.3–1.2)
Total Protein: 6.5 g/dL (ref 6.0–8.3)

## 2013-08-19 LAB — LIPID PANEL
Cholesterol: 281 mg/dL — ABNORMAL HIGH (ref 0–200)
HDL: 51 mg/dL (ref >39)
LDL Cholesterol: 181 mg/dL — ABNORMAL HIGH (ref 0–99)
Total CHOL/HDL Ratio: 5.5 Ratio
Triglycerides: 247 mg/dL — ABNORMAL HIGH (ref <150)
VLDL: 49 mg/dL — ABNORMAL HIGH (ref 0–40)

## 2013-08-19 LAB — BASIC METABOLIC PANEL WITH GFR
BUN: 13 mg/dL (ref 6–23)
CO2: 25 mEq/L (ref 19–32)
Calcium: 9.2 mg/dL (ref 8.4–10.5)
Chloride: 106 mEq/L (ref 96–112)
Creat: 0.53 mg/dL (ref 0.50–1.10)
GFR, Est African American: >89 mL/min
GFR, Est Non African American: >89 mL/min
Glucose, Bld: 79 mg/dL (ref 70–99)
Potassium: 3.6 mEq/L (ref 3.5–5.3)
Sodium: 140 mEq/L (ref 135–145)

## 2013-08-19 LAB — CBC WITH DIFFERENTIAL/PLATELET
Basophils Absolute: 0 10*3/uL (ref 0.0–0.1)
Basophils Relative: 1 % (ref 0–1)
Eosinophils Absolute: 0.2 10*3/uL (ref 0.0–0.7)
Eosinophils Relative: 3 % (ref 0–5)
HCT: 38.1 % (ref 36.0–46.0)
Hemoglobin: 12.6 g/dL (ref 12.0–15.0)
Lymphocytes Relative: 45 % (ref 12–46)
Lymphs Abs: 3.2 10*3/uL (ref 0.7–4.0)
MCH: 29.4 pg (ref 26.0–34.0)
MCHC: 33.1 g/dL (ref 30.0–36.0)
MCV: 88.8 fL (ref 78.0–100.0)
Monocytes Absolute: 0.4 10*3/uL (ref 0.1–1.0)
Monocytes Relative: 5 % (ref 3–12)
Neutro Abs: 3.3 10*3/uL (ref 1.7–7.7)
Neutrophils Relative %: 46 % (ref 43–77)
Platelets: 262 10*3/uL (ref 150–400)
RBC: 4.29 MIL/uL (ref 3.87–5.11)
RDW: 14.4 % (ref 11.5–15.5)
WBC: 7.2 10*3/uL (ref 4.0–10.5)

## 2013-08-19 LAB — TSH: TSH: 2.594 u[IU]/mL (ref 0.350–4.500)

## 2013-08-19 LAB — HEMOGLOBIN A1C
Hgb A1c MFr Bld: 5.7 % — ABNORMAL HIGH (ref <5.7)
Mean Plasma Glucose: 117 mg/dL — ABNORMAL HIGH (ref <117)

## 2013-08-19 LAB — INSULIN, FASTING: Insulin fasting, serum: 6 u[IU]/mL (ref 3–28)

## 2013-08-19 LAB — MAGNESIUM: Magnesium: 1.9 mg/dL (ref 1.5–2.5)

## 2013-08-19 MED ORDER — POTASSIUM CHLORIDE 20 MEQ PO PACK: 20.0000 meq | PACK | Freq: Two times a day (BID) | ORAL | Status: DC

## 2013-08-19 NOTE — Telephone Encounter (Signed)
Message copied by Nicholaus Corolla A on Tue Aug 19, 2013  3:11 PM ------      Message from: Lucky Cowboy      Created: Tue Aug 19, 2013  2:39 PM       K+ /potassium is very low need to start Rx for KCL - will send to drug store - also Chol is dangerously high - need to be sure taking the atorvastatin 80 Mg  At 1/2 tab every day and if already on 1/2 tab the increase to 1 whole tablet daily all else ok ------

## 2013-09-22 ENCOUNTER — Other Ambulatory Visit: Payer: Self-pay | Admitting: Internal Medicine

## 2013-09-26 ENCOUNTER — Other Ambulatory Visit: Payer: Self-pay | Admitting: Internal Medicine

## 2013-09-26 DIAGNOSIS — F332 Major depressive disorder, recurrent severe without psychotic features: Secondary | ICD-10-CM

## 2013-09-26 MED ORDER — SERTRALINE HCL 100 MG PO TABS: 100.0000 mg | ORAL_TABLET | Freq: Every day | ORAL | Status: DC

## 2013-09-26 MED ORDER — ALPRAZOLAM 1 MG PO TABS: 1.0000 mg | ORAL_TABLET | Freq: Three times a day (TID) | ORAL | Status: DC | PRN

## 2013-10-07 ENCOUNTER — Other Ambulatory Visit: Payer: Self-pay | Admitting: Internal Medicine

## 2013-10-15 DIAGNOSIS — E079 Disorder of thyroid, unspecified: Secondary | ICD-10-CM | POA: Insufficient documentation

## 2013-10-16 ENCOUNTER — Ambulatory Visit: Payer: Self-pay | Admitting: Internal Medicine

## 2013-10-16 ENCOUNTER — Encounter: Payer: Self-pay | Admitting: Internal Medicine

## 2013-10-21 ENCOUNTER — Ambulatory Visit (INDEPENDENT_AMBULATORY_CARE_PROVIDER_SITE_OTHER): Payer: Medicare Other | Admitting: Internal Medicine

## 2013-10-21 ENCOUNTER — Encounter: Payer: Self-pay | Admitting: Internal Medicine

## 2013-10-21 VITALS — BP 116/70 | HR 76 | Temp 99.1°F | Resp 18 | Wt 137.6 lb

## 2013-10-21 DIAGNOSIS — I10 Essential (primary) hypertension: Secondary | ICD-10-CM

## 2013-10-21 DIAGNOSIS — F332 Major depressive disorder, recurrent severe without psychotic features: Secondary | ICD-10-CM

## 2013-10-21 DIAGNOSIS — F329 Major depressive disorder, single episode, unspecified: Secondary | ICD-10-CM

## 2013-10-21 MED ORDER — ATORVASTATIN CALCIUM 80 MG PO TABS: 80.0000 mg | ORAL_TABLET | Freq: Every day | ORAL | Status: DC

## 2013-10-21 MED ORDER — BUMETANIDE 2 MG PO TABS: 2.0000 mg | ORAL_TABLET | Freq: Two times a day (BID) | ORAL | Status: DC

## 2013-10-21 MED ORDER — VITAMIN D 50 MCG (2000 UT) PO TABS: 2000.0000 [IU] | ORAL_TABLET | Freq: Three times a day (TID) | ORAL | Status: DC

## 2013-10-21 MED ORDER — LEVOTHYROXINE SODIUM 50 MCG PO TABS: ORAL_TABLET | ORAL | Status: DC

## 2013-10-21 MED ORDER — SERTRALINE HCL 100 MG PO TABS: ORAL_TABLET | ORAL | Status: DC

## 2013-10-21 MED ORDER — FLAX SEED OIL 1300 MG PO CAPS: 1300.0000 mg | ORAL_CAPSULE | Freq: Three times a day (TID) | ORAL | Status: AC

## 2013-10-21 MED ORDER — FISH OIL 1000 MG PO CAPS: 1000.0000 mg | ORAL_CAPSULE | Freq: Three times a day (TID) | ORAL | Status: AC

## 2013-10-21 NOTE — Progress Notes (Signed)
Patient ID: Margaret Nelson, female   DOB: 11-16-1950, 63 y.o.   MRN: [id number]  This very nice 63 y.o. SWF presents for 6 week follow up with Hypertension, Hyperlipidemia, Pre-Diabetes and Vitamin D Deficiency.  History is as note above and well outlined in her previous OV about 2 month ago. She presents today with questions about why he r xanax dose was cut back to tid and she accepted the explanation of avoidance of tolerance and is willing to try different agents in the past. She does hav hx/o severe depression in the past. She had been followed in the past by a couple of psychiatrists - one deceased and the other doesn't accept her isnurance , ie M/C and as she ha s been stable , she hs been maintained on a stable regimen. Approximately 15-20 minutes was spent discussing her anxiety and depression provoking home situation relationship. Also her med schedules were reviewed in detail with several discrepancies discovered and corrected.       Medication List   ALPRAZolam 1 MG tablet  Commonly known as:  XANAX  Take 1 tablet (1 mg total) by mouth 3 (three) times daily as needed for anxiety.     amphetamine-dextroamphetamine 20 MG tablet  Commonly known as:  ADDERALL  Take 20 mg by mouth 3 (three) times daily.     atorvastatin 80 MG tablet  Commonly known as:  LIPITOR  Take 1 tablet (80 mg total) by mouth daily.     bumetanide 2 MG tablet  Commonly known as:  BUMEX  Take 1 tablet (2 mg total) by mouth 2 (two) times daily. For BP and Fluid     Fish Oil 1000 MG Caps  Take 1 capsule (1,000 mg total) by mouth 3 (three) times daily.     Flax Seed Oil 1300 MG Caps  Take 1,300 mg by mouth 3 (three) times daily.     levothyroxine 50 MCG tablet  Commonly known as:  SYNTHROID, LEVOTHROID  take 1 to 1&1/2 tablets daily as directed     magnesium gluconate 500 MG tablet  Commonly known as:  MAGONATE  Take 500 mg by mouth daily.     potassium chloride 20 MEQ packet  Commonly known as:  KLOR-CON   Take 20 mEq by mouth 2 (two) times daily.     sertraline 100 MG tablet  Commonly known as:  ZOLOFT  Take 2 tablets at bedtime     Vitamin D 2000 UNITS tablet  Take 1 tablet (2,000 Units total) by mouth 3 (three) times daily.         Allergies  Allergen Reactions  . Codeine Other (See Comments)    Abdominal pain  . Erythromycin Base     GI upset  . Penicillins Rash    PMHx:   Past Medical History  Diagnosis Date  . Hypertension   . Hyperlipidemia   . Thyroid disease   . Depression     FHx:    Reviewed / unchanged  SHx:    Reviewed / unchanged  Systems Review: [place] Review negative except as above.    BP: 116/70  Pulse: 76  Temp: 99.1 F (37.3 C)  Resp: 18      On Exam: Appears well nourished - in no distress. HEENT - neg Neck: Supple. Thyroid nl. Car 2+/2+ without bruits, nodes or JVD. Chest: Respirations nl with BS clear & equal w/o rales, rhonchi, wheezing or stridor.  Cor: Heart sounds normal w/ regular  rate and rhythm without sig. murmurs, gallops, clicks, or rubs. Peripheral pulses normal and equal  without edema.  Musculoskeletal: Full ROM all peripheral extremities, joint stability, 5/5 strength, and normal gait.  Skin: Warm, dry without exposed rashes, lesions, ecchymosis apparent.  Neuro: Cranial nerves intact, reflexes equal bilaterally. Sensory-motor testing grossly intact. Tendon reflexes grossly intact.  Pysch: Alert & oriented x 3. Insight and judgement nl & appropriate. No ideations.  Assessment and Plan:  1. Hypertension - Continue monitor blood pressure at home. Continue diet/meds same.  2. Hyperlipidemia - Continue diet/meds, exercise,& lifestyle modifications. Continue monitor periodic cholesterol/liver & renal functions   3. Pre-diabetes/Insulin Resistance - Continue diet, exercise, lifestyle modifications. Monitor appropriate labs.  4. Vitamin D Deficiency - Continue supplementation.  5. Deprression, severe by Hx and  stable on current regimen  Recommended regular exercise, BP monitoring, weight control, and discussed med and SE's.Reviewed recent labs. Due F/U in about 1 month for quarterly lipid /liver labs.

## 2013-10-22 ENCOUNTER — Other Ambulatory Visit: Payer: Self-pay | Admitting: Internal Medicine

## 2013-10-22 MED ORDER — AMPHETAMINE-DEXTROAMPHETAMINE 20 MG PO TABS: 20.0000 mg | ORAL_TABLET | Freq: Three times a day (TID) | ORAL | Status: DC

## 2013-10-23 ENCOUNTER — Telehealth: Payer: Self-pay

## 2013-10-23 NOTE — Telephone Encounter (Signed)
Pt's Rx is ready for her to pick up. Lmom to pt.

## 2013-11-19 ENCOUNTER — Ambulatory Visit: Payer: Self-pay | Admitting: Emergency Medicine

## 2013-11-25 ENCOUNTER — Ambulatory Visit: Payer: Self-pay | Admitting: Emergency Medicine

## 2013-12-19 ENCOUNTER — Other Ambulatory Visit: Payer: Self-pay | Admitting: Internal Medicine

## 2013-12-19 DIAGNOSIS — F988 Other specified behavioral and emotional disorders with onset usually occurring in childhood and adolescence: Secondary | ICD-10-CM

## 2013-12-19 MED ORDER — AMPHETAMINE-DEXTROAMPHETAMINE 20 MG PO TABS: 20.0000 mg | ORAL_TABLET | Freq: Three times a day (TID) | ORAL | Status: DC

## 2014-01-01 ENCOUNTER — Other Ambulatory Visit: Payer: Self-pay | Admitting: Internal Medicine

## 2014-01-12 ENCOUNTER — Encounter: Payer: Self-pay | Admitting: Internal Medicine

## 2014-01-29 ENCOUNTER — Other Ambulatory Visit: Payer: Self-pay | Admitting: Internal Medicine

## 2014-01-29 DIAGNOSIS — F988 Other specified behavioral and emotional disorders with onset usually occurring in childhood and adolescence: Secondary | ICD-10-CM

## 2014-01-29 MED ORDER — AMPHETAMINE-DEXTROAMPHETAMINE 20 MG PO TABS: 20.0000 mg | ORAL_TABLET | Freq: Three times a day (TID) | ORAL | Status: DC

## 2014-02-09 ENCOUNTER — Other Ambulatory Visit: Payer: Self-pay | Admitting: Physician Assistant

## 2014-02-12 ENCOUNTER — Ambulatory Visit (INDEPENDENT_AMBULATORY_CARE_PROVIDER_SITE_OTHER): Payer: Medicare Other | Admitting: Internal Medicine

## 2014-02-12 ENCOUNTER — Encounter: Payer: Self-pay | Admitting: Internal Medicine

## 2014-02-12 VITALS — BP 104/68 | HR 72 | Temp 97.9°F | Resp 16 | Ht 63.5 in | Wt 135.0 lb

## 2014-02-12 DIAGNOSIS — R7309 Other abnormal glucose: Secondary | ICD-10-CM | POA: Diagnosis not present

## 2014-02-12 DIAGNOSIS — E559 Vitamin D deficiency, unspecified: Secondary | ICD-10-CM | POA: Diagnosis not present

## 2014-02-12 DIAGNOSIS — I10 Essential (primary) hypertension: Secondary | ICD-10-CM | POA: Diagnosis not present

## 2014-02-12 DIAGNOSIS — Z79899 Other long term (current) drug therapy: Secondary | ICD-10-CM | POA: Insufficient documentation

## 2014-02-12 DIAGNOSIS — F329 Major depressive disorder, single episode, unspecified: Secondary | ICD-10-CM

## 2014-02-12 DIAGNOSIS — N63 Unspecified lump in unspecified breast: Secondary | ICD-10-CM

## 2014-02-12 DIAGNOSIS — Z1331 Encounter for screening for depression: Secondary | ICD-10-CM

## 2014-02-12 DIAGNOSIS — Z1212 Encounter for screening for malignant neoplasm of rectum: Secondary | ICD-10-CM | POA: Diagnosis not present

## 2014-02-12 DIAGNOSIS — E782 Mixed hyperlipidemia: Secondary | ICD-10-CM

## 2014-02-12 DIAGNOSIS — F332 Major depressive disorder, recurrent severe without psychotic features: Secondary | ICD-10-CM

## 2014-02-12 LAB — CBC WITH DIFFERENTIAL/PLATELET
Basophils Absolute: 0.1 10*3/uL (ref 0.0–0.1)
Basophils Relative: 1 % (ref 0–1)
Eosinophils Absolute: 0.2 10*3/uL (ref 0.0–0.7)
Eosinophils Relative: 3 % (ref 0–5)
HCT: 38.8 % (ref 36.0–46.0)
Hemoglobin: 13.2 g/dL (ref 12.0–15.0)
Lymphocytes Relative: 49 % — ABNORMAL HIGH (ref 12–46)
Lymphs Abs: 3.7 10*3/uL (ref 0.7–4.0)
MCH: 30.1 pg (ref 26.0–34.0)
MCHC: 34 g/dL (ref 30.0–36.0)
MCV: 88.4 fL (ref 78.0–100.0)
Monocytes Absolute: 0.6 10*3/uL (ref 0.1–1.0)
Monocytes Relative: 8 % (ref 3–12)
Neutro Abs: 2.9 10*3/uL (ref 1.7–7.7)
Neutrophils Relative %: 39 % — ABNORMAL LOW (ref 43–77)
Platelets: 301 10*3/uL (ref 150–400)
RBC: 4.39 MIL/uL (ref 3.87–5.11)
RDW: 13.7 % (ref 11.5–15.5)
WBC: 7.5 10*3/uL (ref 4.0–10.5)

## 2014-02-12 NOTE — Patient Instructions (Signed)

## 2014-02-12 NOTE — Progress Notes (Signed)
Patient ID: Margaret Nelson, female   DOB: [birth year], 63 y.o.   MRN: [id number]   Annual Screening Comprehensive Examination  This very nice 63 y.o. female presents for complete physical.  Patient has been followed for HTN, Endogenous Depression,  Prediabetes, Hyperlipidemia, and Vitamin D Deficiency.Patient has long Hx/o severe depression and has been followed by several Psychiatrists in the past and now is followed by Dr Toy Care who defined her current med regimen which she has been stable on.    HTN predates since 2006 and has been controlled with diuretic therapy.. Patient's BP has been controlled at home. Today's BP: 104/68 mmHg. Patient denies any cardiac symptoms as chest pain, palpitations, shortness of breath, dizziness or ankle swelling.   Patient's hyperlipidemia is not controlled with diet when she had been off of her Atorvastatin. Last  Lipid profile was in Nov 2014 as below and patient was advised to restart her Atorvastatin. Once again she is c/o severe fatigue and aching and note muscle enzymes have been Nl in the past. Lab Results  Component Value Date   CHOL 281* 08/18/2013   HDL 51 08/18/2013   LDLCALC 181* 08/18/2013   TRIG 247* 08/18/2013   CHOLHDL 5.5 08/18/2013    Patient has prediabetes with A1c 5.7% in May 2013 and last A1c was 5.7% in Nov 2014. Patient denies reactive hypoglycemic symptoms, visual blurring, diabetic polys, or paresthesias.    Finally, patient has history of Vitamin D Deficiency of 36 in 2008 and last vitamin D was 48 in May 2014.  Medication Sig  . ALPRAZolam (XANAX) 1 MG tablet take 1 tablet by mouth four times a day if needed  .  (ADDERALL) 20 MG tablet Take 1 tablet  by mouth 3  times daily. For severe depression  . atorvastatin (LIPITOR) 80 MG tablet Take 1 tablet (80 mg total) by mouth daily.  . bumetanide (BUMEX) 2 MG tablet Take 1 tablet (2 mg total) by mouth 2 (two) times daily. For BP and Fluid  . cholecalciferol 2000 UNITS tablet Take 1  tablet (2,000 Units total) by mouth 3 (three) times daily.  Marland Kitchen FLAX SEED OIL 1300 MG CAPS Take 1,300 mg by mouth 3 (three) times daily.  Marland Kitchen levothyroxine  50 MCG tablet Take 1 to 1&1/2 tablets daily as directed  . magnesium gluconate  500 MG tablet Take 500 mg by mouth daily.  . metoCLOPramide  10 MG tablet take 1 tablet by mouth twice a day for ACID REFLUX  . FISH OIL 1000 MG CAPS Take 1 capsule (1,000 mg total) by mouth 3 (three) times daily.  . potassium chloride  20 MEQ packet Take 20 mEq by mouth 2 (two) times daily.  . sertraline (ZOLOFT) 100 MG tablet Take 2 tablets at bedtime   Allergies  Allergen Reactions  . Codeine Other (See Comments)    Abdominal pain  . Erythromycin Base     GI upset  . Penicillins Rash   Past Medical History  Diagnosis Date  . Hypertension   . Hyperlipidemia   . Thyroid disease   . Depression    Past Surgical History  Procedure Laterality Date  . Cholecystectomy  1975  . Nasal sinus surgery  1996  . Liposuction  1998    with tummy tuck  . Breast surgery Bilateral 1983    implants  . Patient has had 5 breast reconstructions  2008   Family History  Problem Relation Age of Onset  . COPD Mother   .  Cancer Mother     lung  . Emphysema Mother   . Early death Father     murdered at 33  . Diabetes Sister   . Early death Sister     suicide at 62   History  Substance Use Topics  . Smoking status: Current Every Day Smoker  . Smokeless tobacco: Never Used  . Alcohol Use: Yes     Comment: occasional    ROS Constitutional: Denies fever, chills, weight loss/gain, headaches, insomnia, fatigue, night sweats, and change in appetite. Eyes: Denies redness, blurred vision, diplopia, discharge, itchy, watery eyes.  ENT: Denies discharge, congestion, post nasal drip, epistaxis, sore throat, earache, hearing loss, dental pain, Tinnitus, Vertigo, Sinus pain, snoring.  Cardio: Denies chest pain, palpitations, irregular heartbeat, syncope, dyspnea,  diaphoresis, orthopnea, PND, claudication, edema Respiratory: denies cough, dyspnea, DOE, pleurisy, hoarseness, laryngitis, wheezing.  Gastrointestinal: Denies dysphagia, heartburn, reflux, water brash, pain, cramps, nausea, vomiting, bloating, diarrhea, constipation, hematemesis, melena, hematochezia, jaundice, hemorrhoids Genitourinary: Denies dysuria, frequency, urgency, nocturia, hesitancy, discharge, hematuria, flank pain Breast:Breast lumps, nipple discharge, bleeding.  Musculoskeletal: Denies arthralgia, myalgia, stiffness, Jt. Swelling, pain, limp, and strain/sprain. Skin: Denies puritis, rash, hives, warts, acne, eczema, changing in skin lesion Neuro: No weakness, tremor, incoordination, spasms, paresthesia, pain Psychiatric: Denies confusion, memory loss, sensory loss Endocrine: Denies change in weight, skin, hair change, nocturia, and paresthesia, diabetic polys, visual blurring, hyper / hypo glycemic episodes.  Heme/Lymph: No excessive bleeding, bruising, enlarged lymph nodes.   Physical Exam  BP 104/68  Pulse 72  Temp 97.9 F   Resp 16  Ht 5' 3.5"   Wt 135 lb   BMI 23.54 kg/m2  General Appearance: Well nourished, in no apparent distress. Eyes: PERRLA, EOMs, conjunctiva no swelling or erythema, normal fundi and vessels. Sinuses: No frontal/maxillary tenderness ENT/Mouth: EACs patent / TMs  nl. Nares clear without erythema, swelling, mucoid exudates. Oral hygiene is good. No erythema, swelling, or exudate. Tongue normal, non-obstructing. Tonsils not swollen or erythematous. Hearing normal.  Neck: Supple, thyroid normal. No bruits, nodes or JVD. Respiratory: Respiratory effort normal.  BS equal and clear bilateral without rales, rhonci, wheezing or stridor. Cardio: Heart sounds are normal with regular rate and rhythm and no murmurs, rubs or gallops. Peripheral pulses are normal and equal bilaterally without edema. No aortic or femoral bruits. Chest: symmetric with normal  excursions and percussion. Breasts: Symmetric, and pigeon chested from oversized implants. No  nipple discharge, retractions, or fibrocystic changes. There is a mobile non-tender 1.5x1.5 cm lump in the left tail of [place]. Abdomen: Flat, soft, with bowl sounds. Nontender, no guarding, rebound, hernias, masses, or organomegaly.  Lymphatics: Non tender without lymphadenopathy.  Genitourinary:  Musculoskeletal: Full ROM all peripheral extremities, joint stability, 5/5 strength, and normal gait. Skin: Warm and dry without rashes, lesions, cyanosis, clubbing or  ecchymosis.  Neuro: Cranial nerves intact, reflexes equal bilaterally. Normal muscle tone, no cerebellar symptoms. Sensation intact.  Pysch: Awake and oriented X 3, normal affect, Insight and Judgment appropriate.   Assessment and Plan  1. Annual Screening Examination 2. Hypertension  3. Hyperlipidemia 4. Pre Diabetes 5. Vitamin D Deficiency 6. Lt breast lump  Continue prudent diet as discussed, weight control, BP monitoring, regular exercise, and medications. Discussed med's effects and SE's. Screening labs and tests as requested with regular follow-up as recommended.Discouraged smoking. Plan diagnostic MGM and U/S if needed

## 2014-02-13 LAB — INSULIN, FASTING: Insulin fasting, serum: 6 u[IU]/mL (ref 3–28)

## 2014-02-13 LAB — BASIC METABOLIC PANEL WITH GFR
BUN: 13 mg/dL (ref 6–23)
CO2: 26 mEq/L (ref 19–32)
Calcium: 9 mg/dL (ref 8.4–10.5)
Chloride: 99 mEq/L (ref 96–112)
Creat: 0.54 mg/dL (ref 0.50–1.10)
GFR, Est African American: >89 mL/min
GFR, Est Non African American: >89 mL/min
Glucose, Bld: 82 mg/dL (ref 70–99)
Potassium: 3.3 mEq/L — ABNORMAL LOW (ref 3.5–5.3)
Sodium: 138 mEq/L (ref 135–145)

## 2014-02-13 LAB — HEMOGLOBIN A1C
Hgb A1c MFr Bld: 5.6 % (ref <5.7)
Mean Plasma Glucose: 114 mg/dL (ref <117)

## 2014-02-13 LAB — HEPATIC FUNCTION PANEL
ALT: 15 U/L (ref 0–35)
AST: 16 U/L (ref 0–37)
Albumin: 4.3 g/dL (ref 3.5–5.2)
Alkaline Phosphatase: 93 U/L (ref 39–117)
Bilirubin, Direct: 0.1 mg/dL (ref 0.0–0.3)
Indirect Bilirubin: 0.2 mg/dL (ref 0.2–1.2)
Total Bilirubin: 0.3 mg/dL (ref 0.2–1.2)
Total Protein: 6.7 g/dL (ref 6.0–8.3)

## 2014-02-13 LAB — MAGNESIUM: Magnesium: 1.9 mg/dL (ref 1.5–2.5)

## 2014-02-13 LAB — URINALYSIS, MICROSCOPIC ONLY
Bacteria, UA: NONE SEEN
Casts: NONE SEEN
Crystals: NONE SEEN
Squamous Epithelial / LPF: NONE SEEN

## 2014-02-13 LAB — LIPID PANEL
Cholesterol: 154 mg/dL (ref 0–200)
HDL: 48 mg/dL (ref >39)
LDL Cholesterol: 65 mg/dL (ref 0–99)
Total CHOL/HDL Ratio: 3.2 Ratio
Triglycerides: 205 mg/dL — ABNORMAL HIGH (ref <150)
VLDL: 41 mg/dL — ABNORMAL HIGH (ref 0–40)

## 2014-02-13 LAB — MICROALBUMIN / CREATININE URINE RATIO
Creatinine, Urine: 76.9 mg/dL
Microalb Creat Ratio: 7.9 mg/g (ref 0.0–30.0)
Microalb, Ur: 0.61 mg/dL (ref 0.00–1.89)

## 2014-02-13 LAB — TSH: TSH: 2.185 u[IU]/mL (ref 0.350–4.500)

## 2014-02-13 LAB — VITAMIN D 25 HYDROXY (VIT D DEFICIENCY, FRACTURES): Vit D, 25-Hydroxy: 59 ng/mL (ref 30–89)

## 2014-03-02 ENCOUNTER — Other Ambulatory Visit: Payer: Self-pay | Admitting: Internal Medicine

## 2014-03-02 DIAGNOSIS — F988 Other specified behavioral and emotional disorders with onset usually occurring in childhood and adolescence: Secondary | ICD-10-CM

## 2014-03-02 MED ORDER — AMPHETAMINE-DEXTROAMPHETAMINE 20 MG PO TABS: 20.0000 mg | ORAL_TABLET | Freq: Three times a day (TID) | ORAL | Status: DC

## 2014-03-03 ENCOUNTER — Ambulatory Visit (INDEPENDENT_AMBULATORY_CARE_PROVIDER_SITE_OTHER): Payer: Medicare Other | Admitting: Physician Assistant

## 2014-03-03 ENCOUNTER — Encounter: Payer: Self-pay | Admitting: Physician Assistant

## 2014-03-03 VITALS — BP 100/62 | HR 84 | Temp 98.1°F | Resp 16 | Wt 135.0 lb

## 2014-03-03 DIAGNOSIS — J209 Acute bronchitis, unspecified: Secondary | ICD-10-CM | POA: Diagnosis not present

## 2014-03-03 MED ORDER — PREDNISONE 20 MG PO TABS: ORAL_TABLET | ORAL | Status: DC

## 2014-03-03 MED ORDER — HYDROCODONE-ACETAMINOPHEN 5-325 MG PO TABS: 1.0000 | ORAL_TABLET | Freq: Four times a day (QID) | ORAL | Status: DC | PRN

## 2014-03-03 MED ORDER — AZITHROMYCIN 250 MG PO TABS
ORAL_TABLET | ORAL | Status: DC
Start: 2014-03-03 — End: 2014-03-09

## 2014-03-03 NOTE — Progress Notes (Signed)
   Subjective:    Patient ID: Margaret Nelson, female    DOB: 03/13/1951, 63 y.o.   MRN: [id number]  Cough This is a new problem. Episode onset: 4-5 days, worse yesterday. The problem has been gradually worsening. The problem occurs constantly. The cough is non-productive. Associated symptoms include heartburn, nasal congestion, postnasal drip, rhinorrhea, a sore throat, shortness of breath and wheezing. Pertinent negatives include no chest pain, chills, ear congestion, ear pain, fever, headaches, hemoptysis, myalgias, rash, sweats or weight loss. Risk factors for lung disease include smoking/tobacco exposure. She has tried OTC cough suppressant for the symptoms. The treatment provided no relief.    Review of Systems  Constitutional: Positive for fatigue. Negative for fever, chills and weight loss.  HENT: Positive for congestion, postnasal drip, rhinorrhea, sinus pressure and sore throat. Negative for dental problem, ear discharge, ear pain, nosebleeds, trouble swallowing and voice change.   Respiratory: Positive for cough, chest tightness, shortness of breath and wheezing. Negative for hemoptysis.   Cardiovascular: Negative.  Negative for chest pain.  Gastrointestinal: Positive for heartburn.  Genitourinary: Negative.   Musculoskeletal: Negative.  Negative for myalgias.  Skin: Negative for rash.  Neurological: Negative.  Negative for headaches.       Objective:   Physical Exam  Constitutional: She is oriented to person, place, and time. She appears well-developed and well-nourished.  HENT:  Head: Normocephalic and atraumatic.  Right Ear: External ear normal.  Left Ear: External ear normal.  Nose: Right sinus exhibits maxillary sinus tenderness. Left sinus exhibits maxillary sinus tenderness.  Mouth/Throat: Oropharynx is clear and moist.  Eyes: Conjunctivae are normal. Pupils are equal, round, and reactive to light.  Neck: Normal range of motion. Neck supple.  Cardiovascular: Normal  rate and regular rhythm.   Pulmonary/Chest: Effort normal. No respiratory distress. She has wheezes. She has no rales. She exhibits no tenderness.  Abdominal: Soft. Bowel sounds are normal.  Lymphadenopathy:    She has no cervical adenopathy.  Neurological: She is alert and oriented to person, place, and time.  Skin: Skin is warm and dry.      Assessment & Plan:  1. Acute bronchitis - predniSONE (DELTASONE) 20 MG tablet; Take one pill two times daily for 3 days, take one pill daily for 4 days.  Dispense: 10 tablet; Refill: 0 - azithromycin (ZITHROMAX) 250 MG tablet; 2 tablets by mouth today then one tablet daily for 4 days.  Dispense: 6 tablet; Refill: 1 - HYDROcodone-acetaminophen (NORCO) 5-325 MG per tablet; Take 1 tablet by mouth every 6 (six) hours as needed (cough).  Dispense: 30 tablet; Refill: 0 - DG Chest X-Ray (CXR) PA & lateral

## 2014-03-03 NOTE — Patient Instructions (Signed)
Acute Bronchitis Bronchitis is inflammation of the airways that extend from the windpipe into the lungs (bronchi). The inflammation often causes mucus to develop. This leads to a cough, which is the most common symptom of bronchitis.  In acute bronchitis, the condition usually develops suddenly and goes away over time, usually in a couple weeks. Smoking, allergies, and asthma can make bronchitis worse. Repeated episodes of bronchitis may cause further lung problems.  CAUSES Acute bronchitis is most often caused by the same virus that causes a cold. The virus can spread from person to person (contagious).  SIGNS AND SYMPTOMS   Cough.   Fever.   Coughing up mucus.   Body aches.   Chest congestion.   Chills.   Shortness of breath.   Sore throat.  DIAGNOSIS  Acute bronchitis is usually diagnosed through a physical exam. Tests, such as chest X-rays, are sometimes done to rule out other conditions.  TREATMENT  Acute bronchitis usually goes away in a couple weeks. Often times, no medical treatment is necessary. Medicines are sometimes given for relief of fever or cough. Antibiotics are usually not needed but may be prescribed in certain situations. In some cases, an inhaler may be recommended to help reduce shortness of breath and control the cough. A cool mist vaporizer may also be used to help thin bronchial secretions and make it easier to clear the chest.  HOME CARE INSTRUCTIONS  Get plenty of rest.   Drink enough fluids to keep your urine clear or pale yellow (unless you have a medical condition that requires fluid restriction). Increasing fluids may help thin your secretions and will prevent dehydration.   Only take over-the-counter or prescription medicines as directed by your health care provider.   Avoid smoking and secondhand smoke. Exposure to cigarette smoke or irritating chemicals will make bronchitis worse. If you are a smoker, consider using nicotine gum or skin  patches to help control withdrawal symptoms. Quitting smoking will help your lungs heal faster.   Reduce the chances of another bout of acute bronchitis by washing your hands frequently, avoiding people with cold symptoms, and trying not to touch your hands to your mouth, nose, or eyes.   Follow up with your health care provider as directed.  SEEK MEDICAL CARE IF: Your symptoms do not improve after 1 week of treatment.  SEEK IMMEDIATE MEDICAL CARE IF:  You develop an increased fever or chills.   You have chest pain.   You have severe shortness of breath.  You have bloody sputum.   You develop dehydration.  You develop fainting.  You develop repeated vomiting.  You develop a severe headache. MAKE SURE YOU:   Understand these instructions.  Will watch your condition.  Will get help right away if you are not doing well or get worse. Document Released: 10/19/2004 Document Revised: 05/14/2013 Document Reviewed: 03/04/2013 [facility] Patient Information 2014 [place].  We are giving you chantix for smoking cessation. You can do it! And we are here to help! You may have heard some scary side effects about chantix, the three most common I hear about are nausea, crazy dreams and depression.  However, I like for my patients to try to stay on 1/2 a tablet twice a day rather than one tablet twice a day as normally prescribed. This helps decrease the chances of side effects and helps save money by making a one month prescription last two months  Please start the prescription this way:  Start 1/2 tablet by mouth  once daily after food with a full glass of water for 3 days Then do 1/2 tablet by mouth twice daily for 4 days.  At this point we have several options: 1) continue on 1/2 tablet twice a day- which I encourage you to do. You can stay on this dose the rest of the time on the medication or if you still feel the need to smoke you can do one of the two options below. 2) do  one tablet in the morning and 1/2 in the evening which helps decrease dreams. 3) do one tablet twice a day.   What if I miss a dose? If you miss a dose, take it as soon as you can. If it is almost time for your next dose, take only that dose. Do not take double or extra doses.  What should I watch for while using this medicine? Visit your doctor or health care professional for regular check ups. Ask for ongoing advice and encouragement from your doctor or healthcare professional, friends, and family to help you quit. If you smoke while on this medication, quit again  Your mouth may get dry. Chewing sugarless gum or hard candy, and drinking plenty of water may help. Contact your doctor if the problem does not go away or is severe.  You may get drowsy or dizzy. Do not drive, use machinery, or do anything that needs mental alertness until you know how this medicine affects you. Do not stand or sit up quickly, especially if you are an older patient.   The use of this medicine may increase the chance of suicidal thoughts or actions. Pay special attention to how you are responding while on this medicine. Any worsening of mood, or thoughts of suicide or dying should be reported to your health care professional right away.  ADVANTAGES OF QUITTING SMOKING  Within 20 minutes, blood pressure decreases. Your pulse is at normal level.  After 8 hours, carbon monoxide levels in the blood return to normal. Your oxygen level increases.  After 24 hours, the chance of having a heart attack starts to decrease. Your breath, hair, and body stop smelling like smoke.  After 48 hours, damaged nerve endings begin to recover. Your sense of taste and smell improve.  After 72 hours, the body is virtually free of nicotine. Your bronchial tubes relax and breathing becomes easier.  After 2 to 12 weeks, lungs can hold more air. Exercise becomes easier and circulation improves.  After 1 year, the risk of coronary heart  disease is cut in half.  After 5 years, the risk of stroke falls to the same as a nonsmoker.  After 10 years, the risk of lung cancer is cut in half and the risk of other cancers decreases significantly.  After 15 years, the risk of coronary heart disease drops, usually to the level of a nonsmoker.  You will have extra money to spend on things other than cigarettes.

## 2014-03-05 ENCOUNTER — Encounter: Payer: Self-pay | Admitting: Internal Medicine

## 2014-03-09 ENCOUNTER — Ambulatory Visit (INDEPENDENT_AMBULATORY_CARE_PROVIDER_SITE_OTHER): Payer: Medicare Other | Admitting: Internal Medicine

## 2014-03-09 ENCOUNTER — Encounter: Payer: Self-pay | Admitting: Internal Medicine

## 2014-03-09 VITALS — BP 102/66 | HR 64 | Ht 63.5 in | Wt 133.4 lb

## 2014-03-09 DIAGNOSIS — K649 Unspecified hemorrhoids: Secondary | ICD-10-CM | POA: Diagnosis not present

## 2014-03-09 DIAGNOSIS — K219 Gastro-esophageal reflux disease without esophagitis: Secondary | ICD-10-CM

## 2014-03-09 DIAGNOSIS — Z1211 Encounter for screening for malignant neoplasm of colon: Secondary | ICD-10-CM | POA: Diagnosis not present

## 2014-03-09 DIAGNOSIS — K5900 Constipation, unspecified: Secondary | ICD-10-CM

## 2014-03-09 MED ORDER — MOVIPREP 100 G PO SOLR: ORAL | Status: DC

## 2014-03-09 MED ORDER — LINACLOTIDE 145 MCG PO CAPS: 145.0000 ug | ORAL_CAPSULE | Freq: Every day | ORAL | Status: DC

## 2014-03-09 MED ORDER — METOCLOPRAMIDE HCL 10 MG PO TABS: 10.0000 mg | ORAL_TABLET | Freq: Three times a day (TID) | ORAL | Status: DC | PRN

## 2014-03-09 MED ORDER — PANTOPRAZOLE SODIUM 40 MG PO TBEC: 40.0000 mg | DELAYED_RELEASE_TABLET | Freq: Every day | ORAL | Status: DC

## 2014-03-09 NOTE — Progress Notes (Signed)
Patient ID: Margaret Nelson, female   DOB: [birth year], 63 y.o.   MRN: [id number] HPI: Margaret Nelson is a 63 year old female with a past medical history of hemorrhoids, hypertension, GERD, hyperlipidemia, hypothyroidism and depression who is seen in consultation at the request of Dr. Melford Aase for evaluation of hemorrhoids and to consider colorectal cancer screening. She is here alone today. She reports she has a long history of hemorrhoids dating back many years. This comes and goes.  She reports her hemorrhoids are "large" and can be painful with bleeding. She tried suppositories which did not really help. She reports she has made several appointments in the past to discuss hemorrhoids but always canceled them. She has a long history of constipation and uses Dulcolax about once per week to have a bowel movement. With no laxative she has a bowel movement less than once per week. She reports a high fiber diet including fruits and vegetables but this hasn't improved her constipation. She's tried MiraLax in prison the past with no benefit. She denies weight loss and in fact she's gained about 15 pounds over the last year. She denies abdominal pain. Reports good appetite. No nausea or vomiting. She has a history of GERD and previously took Nexium but this was switched to metoclopramide reportedly because it was cheaper. She's been taking metoclopramide 10 mg twice daily for about 5 or 6 years. With this medication she denies heartburn, dysphagia or odynophagia. She was recently treated for bronchitis with azithromycin, hydrocodone as a cough suppressant and prednisone. All of these medicines have been completed.  She denies a family history of colon cancer or inflammatory bowel disease.  She has never had a colonoscopy   Past Medical History  Diagnosis Date  . Hypertension   . Hyperlipidemia   . Thyroid disease   . Depression     Past Surgical History  Procedure Laterality Date  . Cholecystectomy  1975  .  Nasal sinus surgery  1996  . Liposuction  1998    with tummy tuck  . Breast surgery Bilateral 1983    implants  . Breast reconstruction  2008  . Appendectomy      Current Outpatient Prescriptions  Medication Sig Dispense Refill  . ALPRAZolam (XANAX) 1 MG tablet take 1 tablet by mouth four times a day if needed  120 tablet  0  . amphetamine-dextroamphetamine (ADDERALL) 20 MG tablet Take 1 tablet (20 mg total) by mouth 3 (three) times daily. For severe depression  90 tablet  0  . bumetanide (BUMEX) 2 MG tablet Take 1 tablet (2 mg total) by mouth 2 (two) times daily. For BP and Fluid  180 tablet  99  . cholecalciferol 2000 UNITS tablet Take 1 tablet (2,000 Units total) by mouth 3 (three) times daily.      . Flaxseed, Linseed, (FLAX SEED OIL) 1300 MG CAPS Take 1,300 mg by mouth 3 (three) times daily.      Marland Kitchen HYDROcodone-acetaminophen (NORCO) 5-325 MG per tablet Take 1 tablet by mouth every 6 (six) hours as needed (cough).  30 tablet  0  . levothyroxine (SYNTHROID, LEVOTHROID) 50 MCG tablet take 1 to 1&1/2 tablets daily as directed      . magnesium gluconate (MAGONATE) 500 MG tablet Take 500 mg by mouth daily.      . metoCLOPramide (REGLAN) 10 MG tablet Take 1 tablet (10 mg total) by mouth every 8 (eight) hours as needed for nausea.  180 tablet  0  . Omega-3 Fatty Acids (FISH OIL)  1000 MG CAPS Take 1 capsule (1,000 mg total) by mouth 3 (three) times daily.    0  . potassium chloride (KLOR-CON) 20 MEQ packet Take 20 mEq by mouth 3 (three) times daily.      . predniSONE (DELTASONE) 20 MG tablet Take one pill two times daily for 3 days, take one pill daily for 4 days.  10 tablet  0  . sertraline (ZOLOFT) 100 MG tablet Take 2 tablets at bedtime  60 tablet  99  . Linaclotide (LINZESS) 145 MCG CAPS capsule Take 1 capsule (145 mcg total) by mouth daily.  30 capsule  6  . MOVIPREP 100 G SOLR Use per prep instruction  1 kit  0  . pantoprazole (PROTONIX) 40 MG tablet Take 1 tablet (40 mg total) by mouth  daily.  90 tablet  3   No current facility-administered medications for this visit.    Allergies  Allergen Reactions  . Codeine Other (See Comments)    Abdominal pain  . Erythromycin Base     GI upset  . Penicillins Rash    Family History  Problem Relation Age of Onset  . COPD Mother   . Lung cancer Mother   . Emphysema Mother   . Early death Father     murdered at 4  . Diabetes Sister   . Early death Sister     suicide at 38    History  Substance Use Topics  . Smoking status: Current Every Day Smoker  . Smokeless tobacco: Never Used  . Alcohol Use: Yes     Comment: occasional    ROS: As per history of present illness, otherwise negative  BP 102/66  Pulse 64  Ht 5' 3.5" (1.613 m)  Wt 133 lb 6.4 oz (60.51 kg)  BMI 23.26 kg/m2 Constitutional: Well-developed and well-nourished. No distress. HEENT: Normocephalic and atraumatic. Oropharynx is clear and moist. No oropharyngeal exudate. Conjunctivae are normal.  No scleral icterus. Neck: Neck supple. Trachea midline. Cardiovascular: Normal rate, regular rhythm and intact distal pulses. No M/R/G Pulmonary/chest: Effort normal and breath sounds normal. No wheezing, rales or rhonchi. Abdominal: Soft, nontender, nondistended. Bowel sounds active throughout. There are no masses palpable Extremities: no clubbing, cyanosis, or edema Lymphadenopathy: No cervical adenopathy noted. Neurological: Alert and oriented to person place and time. Skin: Skin is warm and dry. No rashes noted. Psychiatric: Normal mood and affect. Behavior is normal.  RELEVANT LABS AND IMAGING: CBC    Component Value Date/Time   WBC 7.5 02/12/2014 1723   RBC 4.39 02/12/2014 1723   HGB 13.2 02/12/2014 1723   HCT 38.8 02/12/2014 1723   PLT 301 02/12/2014 1723   MCV 88.4 02/12/2014 1723   MCH 30.1 02/12/2014 1723   MCHC 34.0 02/12/2014 1723   RDW 13.7 02/12/2014 1723   LYMPHSABS 3.7 02/12/2014 1723   MONOABS 0.6 02/12/2014 1723   EOSABS 0.2 02/12/2014 1723    BASOSABS 0.1 02/12/2014 1723    CMP     Component Value Date/Time   NA 138 02/12/2014 1723   K 3.3* 02/12/2014 1723   CL 99 02/12/2014 1723   CO2 26 02/12/2014 1723   GLUCOSE 82 02/12/2014 1723   BUN 13 02/12/2014 1723   CREATININE 0.54 02/12/2014 1723   CREATININE 0.62 06/22/2009 1411   CALCIUM 9.0 02/12/2014 1723   PROT 6.7 02/12/2014 1723   ALBUMIN 4.3 02/12/2014 1723   AST 16 02/12/2014 1723   ALT 15 02/12/2014 1723   ALKPHOS 93 02/12/2014 1723  BILITOT 0.3 02/12/2014 1723   GFRNONAA >89 02/12/2014 1723   GFRNONAA >60 06/22/2009 1411   GFRAA >89 02/12/2014 1723   GFRAA  Value: >60        The eGFR has been calculated using the MDRD equation. This calculation has not been validated in all clinical situations. eGFR's persistently <60 mL/min signify possible Chronic Kidney Disease. 06/22/2009 1411    ASSESSMENT/PLAN:  63 year old female with a past medical history of hemorrhoids, hypertension, GERD, hyperlipidemia, hypothyroidism and depression who is seen in consultation at the request of Dr. Melford Aase for evaluation of hemorrhoids and to consider colorectal cancer screening.  1.  CRC screening -- I have recommended colonoscopy for screening. She is average risk and has never had colonoscopy. We discussed the test including the risks and benefits and she is agreeable to proceed  2.  Hemorrhoids with constipation -- I do feel that with improvement in constipation her hemorrhoids may also improve. We will examine her hemorrhoids more closely at the time of colonoscopy to determine if she has internal, external or both. Further treatment can be recommended at that time, which may include surgical referral. I will start her on Linzess 145 mcg daily for constipation. We discussed the side effects, namely diarrhea, and that she should notify me if this occurs.    3.  GERD -- we discussed the long-term potential neurologic side effects of chronic metoclopramide therapy. We also discussed the low, but  potential risk of tardive dyskinesia. Given this risk, I will discontinue metoclopramide and try pantoprazole 40 mg daily. This is generic and hopefully will be affordable. I asked that she take this 30 minutes before breakfast and notify me if it is not effective. She voices understanding.

## 2014-03-09 NOTE — Patient Instructions (Signed)
You have been scheduled for a colonoscopy with propofol. Please follow written instructions given to you at your visit today.  Please pick up your prep kit at the pharmacy within the next 1-3 days. If you use inhalers (even only as needed), please bring them with you on the day of your procedure. Your physician has requested that you go to www.startemmi.com and enter the access code given to you at your visit today. This web site gives a general overview about your procedure. However, you should still follow specific instructions given to you by our office regarding your preparation for the procedure.  We have sent the following medications to your pharmacy for you to pick up at your convenience: reglan, pantoprazole and Linzess

## 2014-03-10 ENCOUNTER — Ambulatory Visit
Admission: RE | Admit: 2014-03-10 | Discharge: 2014-03-10 | Disposition: A | Discharge status: Home / Self Care | Payer: Medicare Other | Source: Ambulatory Visit | Attending: Internal Medicine | Admitting: Internal Medicine

## 2014-03-10 DIAGNOSIS — N6459 Other signs and symptoms in breast: Secondary | ICD-10-CM | POA: Diagnosis not present

## 2014-03-10 DIAGNOSIS — N63 Unspecified lump in unspecified breast: Secondary | ICD-10-CM

## 2014-03-23 ENCOUNTER — Other Ambulatory Visit: Payer: Self-pay | Admitting: Physician Assistant

## 2014-04-07 ENCOUNTER — Other Ambulatory Visit: Payer: Self-pay | Admitting: Internal Medicine

## 2014-04-07 DIAGNOSIS — F988 Other specified behavioral and emotional disorders with onset usually occurring in childhood and adolescence: Secondary | ICD-10-CM

## 2014-04-07 MED ORDER — AMPHETAMINE-DEXTROAMPHETAMINE 20 MG PO TABS: 20.0000 mg | ORAL_TABLET | Freq: Three times a day (TID) | ORAL | Status: DC

## 2014-04-13 ENCOUNTER — Encounter: Payer: Self-pay | Admitting: Internal Medicine

## 2014-04-13 ENCOUNTER — Ambulatory Visit (AMBULATORY_SURGERY_CENTER): Payer: Medicare Other | Admitting: Internal Medicine

## 2014-04-13 ENCOUNTER — Other Ambulatory Visit: Payer: Self-pay | Admitting: Internal Medicine

## 2014-04-13 VITALS — BP 91/53 | HR 64 | Temp 97.8°F | Resp 13 | Ht 63.5 in | Wt 134.0 lb

## 2014-04-13 DIAGNOSIS — Z1211 Encounter for screening for malignant neoplasm of colon: Secondary | ICD-10-CM

## 2014-04-13 DIAGNOSIS — E079 Disorder of thyroid, unspecified: Secondary | ICD-10-CM | POA: Diagnosis not present

## 2014-04-13 DIAGNOSIS — I10 Essential (primary) hypertension: Secondary | ICD-10-CM | POA: Diagnosis not present

## 2014-04-13 DIAGNOSIS — D126 Benign neoplasm of colon, unspecified: Secondary | ICD-10-CM

## 2014-04-13 MED ORDER — LINACLOTIDE 290 MCG PO CAPS: 290.0000 ug | ORAL_CAPSULE | Freq: Every day | ORAL | Status: DC

## 2014-04-13 MED ORDER — SODIUM CHLORIDE 0.9 % IV SOLN: 500.0000 mL | INTRAVENOUS | Status: DC

## 2014-04-13 NOTE — Progress Notes (Signed)
Called to room to assist during endoscopic procedure.  Patient ID and intended procedure confirmed with present staff. Received instructions for my participation in the procedure from the performing physician.  

## 2014-04-13 NOTE — Progress Notes (Signed)
Procedure ends, to recovery, report given and VSS. 

## 2014-04-13 NOTE — Op Note (Signed)
[place]  [address] [place], [id number]   COLONOSCOPY PROCEDURE REPORT  PATIENT: Margaret Nelson, Margaret Nelson  MR#: [id number] BIRTHDATE: May 13, 1951 , 63  yrs. old GENDER: Female ENDOSCOPIST: Jerene Bears, MD REFERRED [id number] Melford Aase, [name] PROCEDURE DATE:  04/13/2014 PROCEDURE:   Colonoscopy with snare polypectomy First Screening Colonoscopy - Avg.  risk and is 50 yrs.  old or older Yes.  Prior Negative Screening - Now for repeat screening. N/A  History of Adenoma - Now for follow-up colonoscopy & has been > or = to 3 yrs.  N/A  Polyps Removed Today? Yes. ASA CLASS:   Class II INDICATIONS:average risk screening and first colonoscopy. MEDICATIONS: MAC sedation, administered by CRNA and propofol (Diprivan) 400mg  IV  DESCRIPTION OF PROCEDURE:   After the risks benefits and alternatives of the procedure were thoroughly explained, informed consent was obtained.  A digital rectal exam revealed external hemorrhoids.   The LB [id number]  endoscope was introduced through the anus and advanced to the cecum, which was identified by both the appendix and ileocecal valve. No adverse events experienced.   The quality of the prep was Moviprep fair  The instrument was then slowly withdrawn as the colon was fully examined.   COLON FINDINGS: Seven sessile polyps measuring 3-8 mm in size were found in the transverse colon (2), sigmoid colon (2), and rectum (4).  Polypectomy was performed using cold snare (6) and using hot snare (1, sigmoid).  All resections were complete and all polyp tissue was completely retrieved.   There was mild diverticulosis noted in the transverse colon, descending colon, and sigmoid colon with associated tortuosity.   A possible submucosal subepithelial lesion, measuring 1.5-2.5cm in length, was found in the rectum. This was not palpable on rectal examination today. Retroflexed views revealed internal/external hemorrhoids. The time to  cecum=9 minutes 53 seconds.  Withdrawal time=23 minutes 06 seconds.  The scope was withdrawn and the procedure completed.  COMPLICATIONS: There were no complications.    ENDOSCOPIC IMPRESSION: 1.   Seven sessile polyps measuring 3-8 mm in size were found in the transverse colon, sigmoid colon, and rectum; Polypectomy was performed using cold snare and using hot snare 2.   There was mild diverticulosis noted in the transverse colon, descending colon, and sigmoid colon 3.   Possible subepithelial lesion in the rectum 4.   Internal and external hemorrhoids  RECOMMENDATIONS: 1.  Await pathology results 2.  Avoid all NSAIDS for the next 2 weeks. 3.  High fiber diet 4.  Trial of Linzess 290 mcg daily (increased dose) for constipation 5.  Consideration of rectal EUS to further characterize submucosal lesion 6.  Office follow-up thereafter   eSigned:  Jerene Bears, MD 04/13/2014 3:08 PM   cc: The Patient and Unk Pinto, MD   PATIENT NAME:  Margaret Nelson, Margaret Nelson MR#: [id number]

## 2014-04-13 NOTE — Patient Instructions (Signed)

## 2014-04-14 ENCOUNTER — Other Ambulatory Visit: Payer: Self-pay

## 2014-04-14 ENCOUNTER — Telehealth: Payer: Self-pay

## 2014-04-14 DIAGNOSIS — K629 Disease of anus and rectum, unspecified: Secondary | ICD-10-CM

## 2014-04-14 NOTE — Telephone Encounter (Signed)
Pt has been scheduled for 04/23/14 instructions have been mailed to her home, needs verbal instructions.  Left message on machine to call back

## 2014-04-14 NOTE — Telephone Encounter (Signed)
See alternate note  

## 2014-04-14 NOTE — Telephone Encounter (Signed)
No answer, left voice mail to call [facility] if questions or concerns following procedure 04-13-2014

## 2014-04-14 NOTE — Telephone Encounter (Signed)
Ulice Dash, I looked at your colonoscopy photos. I think lower EUS is a good way to workup the lesion. I'll have Leonda Cristo get in touch with her if you can give her a heads up we'll be calling.  Thanks Treysen Sudbeck, She needs lower EUS, radial +/- linear. Next available. Can be moderate sedation. Thanks ----- Message -----  From: Jerene Bears, MD Sent: 04/13/2014 3:11 PM To: Milus Banister, MD Dan Did colon today on this lady and she has was appears to be a submucosal lesion in the rectum. No palpable, but I was thinking EUS for further characterization. Thoughts? Thanks. Ulice Dash

## 2014-04-14 NOTE — Telephone Encounter (Signed)
Thanks Linna Hoff I let her know yesterday that she would be hearing from you/Shmiel Morton JMP ----- Message ----- From: Milus Banister, MD Sent: 04/14/2014 9:22 AM To: Barron Alvine, CMA, Jerene Bears, MD Ulice Dash, I looked at your colonoscopy photos. I think lower EUS is a good way to workup the lesion. I'll have Chatara Lucente get in touch with her if you can give her a heads up we'll be calling. Thanks Kaydan Wilhoite, She needs lower EUS, radial +/- linear. Next available. Can be moderate sedation. Thanks ----- Message ----- From: Jerene Bears, MD Sent: 04/13/2014 3:11 PM To: Milus Banister, MD Dan Did colon today on this lady and she has was appears to be a submucosal lesion in the rectum. No palpable, but I was thinking EUS for further characterization. Thoughts? Thanks. Ulice Dash

## 2014-04-15 NOTE — Telephone Encounter (Signed)
Left message for pt to call back  °

## 2014-04-16 ENCOUNTER — Encounter: Payer: Self-pay | Admitting: Internal Medicine

## 2014-04-16 NOTE — Telephone Encounter (Signed)
Left message on machine to call back  

## 2014-04-16 NOTE — Telephone Encounter (Signed)
EUS scheduled, pt instructed and medications reviewed.  Patient instructions mailed to home.  Patient to call with any questions or concerns.  

## 2014-04-23 ENCOUNTER — Ambulatory Visit (HOSPITAL_COMMUNITY)
Admission: RE | Admit: 2014-04-23 | Discharge: 2014-04-23 | Disposition: A | Discharge status: Home / Self Care | Payer: Medicare Other | Source: Ambulatory Visit | Attending: Gastroenterology | Admitting: Gastroenterology

## 2014-04-23 ENCOUNTER — Encounter (HOSPITAL_COMMUNITY): Payer: Self-pay | Admitting: *Deleted

## 2014-04-23 ENCOUNTER — Encounter (HOSPITAL_COMMUNITY)
Admission: RE | Disposition: A | Discharge status: Home / Self Care | Payer: Self-pay | Source: Ambulatory Visit | Attending: Gastroenterology

## 2014-04-23 DIAGNOSIS — F17200 Nicotine dependence, unspecified, uncomplicated: Secondary | ICD-10-CM | POA: Insufficient documentation

## 2014-04-23 DIAGNOSIS — Z9010 Acquired absence of unspecified breast and nipple: Secondary | ICD-10-CM | POA: Insufficient documentation

## 2014-04-23 DIAGNOSIS — Z0389 Encounter for observation for other suspected diseases and conditions ruled out: Secondary | ICD-10-CM | POA: Diagnosis not present

## 2014-04-23 DIAGNOSIS — K629 Disease of anus and rectum, unspecified: Secondary | ICD-10-CM

## 2014-04-23 DIAGNOSIS — Z85828 Personal history of other malignant neoplasm of skin: Secondary | ICD-10-CM | POA: Diagnosis not present

## 2014-04-23 DIAGNOSIS — K219 Gastro-esophageal reflux disease without esophagitis: Secondary | ICD-10-CM | POA: Insufficient documentation

## 2014-04-23 DIAGNOSIS — K6289 Other specified diseases of anus and rectum: Secondary | ICD-10-CM

## 2014-04-23 HISTORY — PX: EUS: SHX5427

## 2014-04-23 SURGERY — ULTRASOUND, LOWER GI TRACT, ENDOSCOPIC
Anesthesia: Moderate Sedation

## 2014-04-23 MED ORDER — SODIUM CHLORIDE 0.9 % IV SOLN
INTRAVENOUS | Status: DC
  Administered 2014-04-23: 500 mL via INTRAVENOUS

## 2014-04-23 MED ORDER — MIDAZOLAM HCL 10 MG/2ML IJ SOLN
INTRAMUSCULAR | Status: DC | PRN
  Administered 2014-04-23 (×4): 2.5 mg via INTRAVENOUS

## 2014-04-23 MED ORDER — DIPHENHYDRAMINE HCL 50 MG/ML IJ SOLN
INTRAMUSCULAR | Status: DC | PRN
  Administered 2014-04-23 (×2): 25 mg via INTRAVENOUS

## 2014-04-23 MED ORDER — MIDAZOLAM HCL 10 MG/2ML IJ SOLN
INTRAMUSCULAR | Status: AC
  Filled 2014-04-23: qty 4

## 2014-04-23 MED ORDER — FENTANYL CITRATE 0.05 MG/ML IJ SOLN
INTRAMUSCULAR | Status: DC | PRN
  Administered 2014-04-23 (×4): 25 ug via INTRAVENOUS

## 2014-04-23 MED ORDER — DIPHENHYDRAMINE HCL 50 MG/ML IJ SOLN
INTRAMUSCULAR | Status: AC
  Filled 2014-04-23: qty 1

## 2014-04-23 MED ORDER — FENTANYL CITRATE 0.05 MG/ML IJ SOLN
INTRAMUSCULAR | Status: AC
  Filled 2014-04-23: qty 4

## 2014-04-23 NOTE — H&P (Signed)
  HPI: This is a very pleasant woman found to have subepith nodule in rectum on colonoscopy Dr. Hilarie Fredrickson recently.  7 polyps were removed, 3 were adenomatous.    Past Medical History  Diagnosis Date  . Hypertension   . Hyperlipidemia   . Depression   . Thyroid disease     hypo  . Allergy   . Anxiety   . Arthritis   . Asthma     as a child, out grew   . Blood transfusion without reported diagnosis   . Cancer     basal cell left temple, left side of nose  . GERD (gastroesophageal reflux disease)     Past Surgical History  Procedure Laterality Date  . Cholecystectomy  1975  . Nasal sinus surgery  1996  . Liposuction  1998    with tummy tuck  . Breast surgery Bilateral 1983    implants  . Breast reconstruction  2008  . Appendectomy    . Bil breast lumpectomy    . Bilateral breast masectomy  no breast cancer    bil breast removed to prevent ca.    Current Facility-Administered Medications  Medication Dose Route Frequency Provider Last Rate Last Dose  . 0.9 %  sodium chloride infusion   Intravenous Continuous Milus Banister, MD 20 mL/hr at 04/23/14 1352 500 mL at 04/23/14 1352    Allergies as of 04/14/2014 - Review Complete 04/13/2014  Allergen Reaction Noted  . Codeine Other (See Comments) 08/18/2013  . Erythromycin base  10/15/2013  . Penicillins Rash 08/18/2013    Family History  Problem Relation Age of Onset  . COPD Mother   . Lung cancer Mother   . Emphysema Mother   . Early death Father     murdered at 48  . Early death Sister     suicide at 25  . Colon cancer Neg Hx   . Esophageal cancer Neg Hx   . Pancreatic cancer Neg Hx   . Rectal cancer Neg Hx   . Stomach cancer Neg Hx   . Diabetes Sister     History   Social History  . Marital Status: Divorced    Spouse Name: N/A    Number of Children: 2  . Years of Education: N/A   Occupational History  . Retired    Social History Main Topics  . Smoking status: Current Every Day Smoker -- 0.50  packs/day  . Smokeless tobacco: Never Used  . Alcohol Use: Yes     Comment: occasional  . Drug Use: No  . Sexual Activity: Not on file   Other Topics Concern  . Not on file   Social History Narrative  . No narrative on file      Physical Exam: BP 106/65  Pulse 86  Temp(Src) 98.5 F (36.9 C) (Oral)  Resp 15  SpO2 94% Constitutional: generally well-appearing Psychiatric: alert and oriented x3 Abdomen: soft, nontender, nondistended, no obvious ascites, no peritoneal signs, normal bowel sounds     Assessment and plan: 63 y.o. female with rectal lesion  For lower EUS today

## 2014-04-23 NOTE — Op Note (Signed)
[place], [id number]   ENDOSCOPIC ULTRASOUND PROCEDURE REPORT  PATIENT: Margaret Nelson, Margaret Nelson  MR#: [id number] BIRTHDATE: July 13, 1951  GENDER: Female ENDOSCOPIST: Milus Banister, MD REFERRED BY:  Zenovia Jarred, MD PROCEDURE DATE:  04/23/2014 PROCEDURE:   Lower EUS ASA CLASS:      Class II INDICATIONS:   1.  possible submucosal lesion noted in rectum during recent colonoscopy. MEDICATIONS: Benadryl 50 mg IV, Fentanyl 100 mcg IV, and Versed 10 mg IV  DESCRIPTION OF PROCEDURE:   After the risks benefits and alternatives of the procedure were  explained, informed consent was obtained. The patient was then placed in the left, lateral, decubitus postion and IV sedation was administered. Throughout the procedure, the patients blood pressure, pulse and oxygen saturations were monitored continuously.  Under direct visualization, the Pentax Radial EUS [id number]  endoscope was introduced through the mouth  and advanced to the sigmoid colon . Water was used as necessary to provide an acoustic interface.  Upon completion of the imaging, water was removed and the patient was sent to the recovery room in satisfactory condition.   Sigmoidoscopic findings: 1. Normal rectum and sigmoid.  I did not visualize the previously noted submucosal lesion with radial echoendoscope or with adult colonoscope, retroflex and antegrade positions.  EUS findings: 1. Normal echolayering of rectal wall.  Impression: The previously noted submucosal lesion was not visible on this examination.  I reviewed Dr. Marko Stai pictures and suspect the lesion may have been due to swollen deep internal hemorrhoid or prehaps air insufflation in proximal bowel created the look of a lesion deep to the rectum.   _______________________________ eSigned:  Milus Banister, MD 04/23/2014 2:51 PM

## 2014-04-23 NOTE — Discharge Instructions (Signed)
YOU HAD AN ENDOSCOPIC PROCEDURE TODAY: Refer to the procedure report that was given to you for any specific questions about what was found during the examination.  If the procedure report does not answer your questions, please call your gastroenterologist to clarify. ° °YOU SHOULD EXPECT: Some feelings of bloating in the abdomen. Passage of more gas than usual.  Walking can help get rid of the air that was put into your GI tract during the procedure and reduce the bloating. If you had a lower endoscopy (such as a colonoscopy or flexible sigmoidoscopy) you may notice spotting of blood in your stool or on the toilet paper.  ° °DIET: Your first meal following the procedure should be a light meal and then it is ok to progress to your normal diet.  A half-sandwich or bowl of soup is an example of a good first meal.  Heavy or fried foods are harder to digest and may make you feel nasueas or bloated.  Drink plenty of fluids but you should avoid alcoholic beverages for 24 hours. ° °ACTIVITY: Your care partner should take you home directly after the procedure.  You should plan to take it easy, moving slowly for the rest of the day.  You can resume normal activity the day after the procedure however you should NOT DRIVE or use heavy machinery for 24 hours (because of the sedation medicines used during the test).   ° °SYMPTOMS TO REPORT IMMEDIATELY  °A gastroenterologist can be reached at any hour.  Please call your doctor's office for any of the following symptoms: ° °· Following lower endoscopy (colonoscopy, flexible sigmoidoscopy) ° Excessive amounts of blood in the stool ° Significant tenderness, worsening of abdominal pains ° Swelling of the abdomen that is new, acute ° Fever of 100° or higher °· Following upper endoscopy (EGD, EUS, ERCP) ° Vomiting of blood or coffee ground material ° New, significant abdominal pain ° New, significant chest pain or pain under the shoulder blades ° Painful or persistently difficult  swallowing ° New shortness of breath ° Black, tarry-looking stools ° °FOLLOW UP: °If any biopsies were taken you will be contacted by phone or by letter within the next 1-3 weeks.  Call your gastroenterologist if you have not heard about the biopsies in 3 weeks.  °Please also call your gastroenterologist's office with any specific questions about appointments or follow up tests. °Colonoscopy, Care After °Refer to this sheet in the next few weeks. These instructions provide you with information on caring for yourself after your procedure. Your health care provider may also give you more specific instructions. Your treatment has been planned according to current medical practices, but problems sometimes occur. Call your health care provider if you have any problems or questions after your procedure. °WHAT TO EXPECT AFTER THE PROCEDURE  °After your procedure, it is typical to have the following: °· A small amount of blood in your stool. °· Moderate amounts of gas and mild abdominal cramping or bloating. °HOME CARE INSTRUCTIONS °· Do not drive, operate machinery, or sign important documents for 24 hours. °· You may shower and resume your regular physical activities, but move at a slower pace for the first 24 hours. °· Take frequent rest periods for the first 24 hours. °· Walk around or put a warm pack on your abdomen to help reduce abdominal cramping and bloating. °· Drink enough fluids to keep your urine clear or pale yellow. °· You may resume your normal diet as instructed by your health   care provider. Avoid heavy or fried foods that are hard to digest. °· Avoid drinking alcohol for 24 hours or as instructed by your health care provider. °· Only take over-the-counter or prescription medicines as directed by your health care provider. °· If a tissue sample (biopsy) was taken during your procedure: °¨ Do not take aspirin or blood thinners for 7 days, or as instructed by your health care provider. °¨ Do not drink alcohol  for 7 days, or as instructed by your health care provider. °¨ Eat soft foods for the first 24 hours. °SEEK MEDICAL CARE IF: °You have persistent spotting of blood in your stool 2-3 days after the procedure. °SEEK IMMEDIATE MEDICAL CARE IF: °· You have more than a small spotting of blood in your stool. °· You pass large blood clots in your stool. °· Your abdomen is swollen (distended). °· You have nausea or vomiting. °· You have a fever. °· You have increasing abdominal pain that is not relieved with medicine. °Document Released: 04/25/2004 Document Revised: 07/02/2013 Document Reviewed: 05/19/2013 °[employer]® Patient Information ©2015 [employer]. This information is not intended to replace advice given to you by your health care provider. Make sure you discuss any questions you have with your health care provider. ° °

## 2014-04-27 ENCOUNTER — Encounter (HOSPITAL_COMMUNITY): Payer: Self-pay | Admitting: Gastroenterology

## 2014-04-28 ENCOUNTER — Other Ambulatory Visit: Payer: Self-pay | Admitting: Internal Medicine

## 2014-04-29 ENCOUNTER — Other Ambulatory Visit: Payer: Self-pay | Admitting: Physician Assistant

## 2014-04-29 MED ORDER — ALPRAZOLAM 1 MG PO TABS: ORAL_TABLET | ORAL | Status: DC

## 2014-05-18 ENCOUNTER — Ambulatory Visit: Payer: Self-pay | Admitting: Physician Assistant

## 2014-05-26 ENCOUNTER — Ambulatory Visit (INDEPENDENT_AMBULATORY_CARE_PROVIDER_SITE_OTHER): Payer: Medicare Other | Admitting: Internal Medicine

## 2014-05-26 ENCOUNTER — Encounter: Payer: Self-pay | Admitting: Internal Medicine

## 2014-05-26 VITALS — BP 104/70 | HR 84 | Temp 98.6°F | Resp 16 | Ht 63.25 in | Wt 137.0 lb

## 2014-05-26 DIAGNOSIS — E559 Vitamin D deficiency, unspecified: Secondary | ICD-10-CM | POA: Diagnosis not present

## 2014-05-26 DIAGNOSIS — F988 Other specified behavioral and emotional disorders with onset usually occurring in childhood and adolescence: Secondary | ICD-10-CM

## 2014-05-26 DIAGNOSIS — I10 Essential (primary) hypertension: Secondary | ICD-10-CM

## 2014-05-26 DIAGNOSIS — M25541 Pain in joints of right hand: Secondary | ICD-10-CM

## 2014-05-26 DIAGNOSIS — Z79899 Other long term (current) drug therapy: Secondary | ICD-10-CM | POA: Diagnosis not present

## 2014-05-26 DIAGNOSIS — M25549 Pain in joints of unspecified hand: Secondary | ICD-10-CM

## 2014-05-26 DIAGNOSIS — E782 Mixed hyperlipidemia: Secondary | ICD-10-CM | POA: Diagnosis not present

## 2014-05-26 DIAGNOSIS — R7309 Other abnormal glucose: Secondary | ICD-10-CM | POA: Diagnosis not present

## 2014-05-26 LAB — CBC WITH DIFFERENTIAL/PLATELET
Basophils Absolute: 0.1 10*3/uL (ref 0.0–0.1)
Basophils Relative: 1 % (ref 0–1)
Eosinophils Absolute: 0.2 10*3/uL (ref 0.0–0.7)
Eosinophils Relative: 3 % (ref 0–5)
HCT: 38.9 % (ref 36.0–46.0)
Hemoglobin: 13.3 g/dL (ref 12.0–15.0)
Lymphocytes Relative: 39 % (ref 12–46)
Lymphs Abs: 3.2 10*3/uL (ref 0.7–4.0)
MCH: 30.6 pg (ref 26.0–34.0)
MCHC: 34.2 g/dL (ref 30.0–36.0)
MCV: 89.4 fL (ref 78.0–100.0)
Monocytes Absolute: 0.6 10*3/uL (ref 0.1–1.0)
Monocytes Relative: 7 % (ref 3–12)
Neutro Abs: 4.2 10*3/uL (ref 1.7–7.7)
Neutrophils Relative %: 50 % (ref 43–77)
Platelets: 326 10*3/uL (ref 150–400)
RBC: 4.35 MIL/uL (ref 3.87–5.11)
RDW: 14.1 % (ref 11.5–15.5)
WBC: 8.3 10*3/uL (ref 4.0–10.5)

## 2014-05-26 LAB — HEMOGLOBIN A1C
Hgb A1c MFr Bld: 5.9 % — ABNORMAL HIGH (ref <5.7)
Mean Plasma Glucose: 123 mg/dL — ABNORMAL HIGH (ref <117)

## 2014-05-26 MED ORDER — MELOXICAM 15 MG PO TABS
15.0000 mg | ORAL_TABLET | Freq: Every day | ORAL | Status: AC
Start: 2014-05-26 — End: 2015-05-27

## 2014-05-26 MED ORDER — AMPHETAMINE-DEXTROAMPHETAMINE 20 MG PO TABS
20.0000 mg | ORAL_TABLET | Freq: Three times a day (TID) | ORAL | Status: DC
Start: 2014-05-26 — End: 2014-07-17

## 2014-05-26 NOTE — Patient Instructions (Signed)

## 2014-05-26 NOTE — Progress Notes (Signed)
Patient ID: Margaret Nelson, female   DOB: [birth year], 63 y.o.   MRN: [id number]   This very nice 63 y.o.SWF presents for 3 month follow up with Hypertension, Hyperlipidemia, Pre-Diabetes, Depression and Vitamin D Deficiency. Patient's previously severe Depression has been stable and controlled over the last several years.   Patient is treated for HTN & BP has been controlled with diuretics. Today's BP: 104/70 mmHg. Patient denies any cardiac type chest pain, palpitations, dyspnea/orthopnea/PND, dizziness, claudication, or dependent edema.   Hyperlipidemia is controlled at goal with diet & supplements. Patient denies myalgias or other med SE's. Last Lipids were Chol 154; HDL  48; LDL  65; Trig 205 on 02/12/2014.   Also, the patient is screened for PreDiabetes and last A1c was  5.6% on 02/12/2014. She denies any symptoms of reactive hypoglycemia, diabetic polys, paresthesias or visual blurring.  .   Further, Patient has history of Vitamin D Deficiency (36 in 2008) and patient supplements vitamin D without any suspected side-effects. Last vitamin D was  59 on 02/12/2014.    Medication List   ALPRAZolam 1 MG tablet  take 1 tablet by mouth four times a day if needed     ADDERALL 20 MG tablet  Take 1 tablet (20 mg total) by mouth 3 (three) times daily. For severe depression     bumetanide 2 MG tablet  Take 1 tablet (2 mg total) by mouth 2 (two) times daily. For BP and Fluid     buPROPion 300 MG 24 hr tablet  take 1 tablet by mouth every morning for MOOD     Fish Oil 1000 MG Caps  Take 1 capsule (1,000 mg total) by mouth 3 (three) times daily.     Flax Seed Oil 1300 MG Caps  Take 1,300 mg by mouth 3 (three) times daily.     levothyroxine 50 MCG tablet  take 1 to 1&1/2 tablets daily as directed      LINZESS290 MCG Caps   Take 1 capsule  daily.     magnesium gluconate 500 MG tablet  Take 500 mg by mouth daily.     pantoprazole 40 MG tablet  Take 1 tablet (40 mg total) by mouth daily.     potassium chloride 20 MEQ packet  Take 20 mEq by mouth 3 (three) times daily.     sertraline 100 MG tablet  Take 2 tablets at bedtime     Vitamin D 2000 UNITS tablet  Take 1 tablet  by mouth 3  times daily. (6,000 u/daily)     Allergies  Allergen Reactions  . Codeine Other (See Comments)    Abdominal pain  . Erythromycin Base     GI upset  . Penicillins Rash   PMHx:   Past Medical History  Diagnosis Date  . Hypertension   . Hyperlipidemia   . Depression   . Thyroid disease     hypo  . Allergy   . Anxiety   . Arthritis   . Asthma     as a child, out grew   . Blood transfusion without reported diagnosis   . Cancer     basal cell left temple, left side of nose  . GERD (gastroesophageal reflux disease)    FHx:    Reviewed / unchanged SHx:    Reviewed / unchanged  Systems Review:  Constitutional: Denies fever, chills, wt changes, headaches, insomnia, fatigue, night sweats, change in appetite. Eyes: Denies redness, blurred vision, diplopia, discharge, itchy, watery eyes.  ENT:  Denies discharge, congestion, post nasal drip, epistaxis, sore throat, earache, hearing loss, dental pain, tinnitus, vertigo, sinus pain, snoring.  CV: Denies chest pain, palpitations, irregular heartbeat, syncope, dyspnea, diaphoresis, orthopnea, PND, claudication or edema. Respiratory: denies cough, dyspnea, DOE, pleurisy, hoarseness, laryngitis, wheezing.  Gastrointestinal: Denies dysphagia, odynophagia, heartburn, reflux, water brash, abdominal pain or cramps, nausea, vomiting, bloating, diarrhea, constipation, hematemesis, melena, hematochezia  or hemorrhoids. Genitourinary: Denies dysuria, frequency, urgency, nocturia, hesitancy, discharge, hematuria or flank pain. Musculoskeletal: Denies arthralgias, myalgias, stiffness, jt. swelling, pain, limping or strain/sprain.  Skin: Denies pruritus, rash, hives, warts, acne, eczema or change in skin lesion(s). Neuro: No weakness, tremor, incoordination,  spasms, paresthesia or pain. Psychiatric: Denies confusion, memory loss or sensory loss. Endo: Denies change in weight, skin or hair change.  Heme/Lymph: No excessive bleeding, bruising or enlarged lymph nodes.  Exam:  BP 104/70  Pulse 84  Temp 98.6 F  Resp 16  Ht 5' 3.25"   Wt 137 lb   BMI 24.06   Appears well nourished and in no distress. Eyes: PERRLA, EOMs, conjunctiva no swelling or erythema. Sinuses: No frontal/maxillary tenderness ENT/Mouth: EAC's clear, TM's nl w/o erythema, bulging. Nares clear w/o erythema, swelling, exudates. Oropharynx clear without erythema or exudates. Oral hygiene is good. Tongue normal, non obstructing. Hearing intact.  Neck: Supple. Thyroid nl. Car 2+/2+ without bruits, nodes or JVD. Chest: Respirations nl with BS clear & equal w/o rales, rhonchi, wheezing or stridor.  Cor: Heart sounds normal w/ regular rate and rhythm without sig. murmurs, gallops, clicks, or rubs. Peripheral pulses normal and equal  without edema.  Abdomen: Soft & bowel sounds normal. Non-tender w/o guarding, rebound, hernias, masses, or organomegaly.  Lymphatics: Unremarkable.  Musculoskeletal: Full ROM all peripheral extremities, joint stability, 5/5 strength, and normal gait.  Skin: Warm, dry without exposed rashes, lesions or ecchymosis apparent.  Neuro: Cranial nerves intact, reflexes equal bilaterally. Sensory-motor testing grossly intact. Tendon reflexes grossly intact.  Pysch: Alert & oriented x 3.  Insight and judgement nl & appropriate. No ideations.  Assessment and Plan:  1. Hypertension - Continue monitor blood pressure at home. Continue diet/meds same.  2. Hyperlipidemia - Continue diet/meds, exercise,& lifestyle modifications. Continue monitor periodic cholesterol/liver & renal functions   3. Pre-Diabetes - Continue diet, exercise, lifestyle modifications. Monitor appropriate labs.  4. Vitamin D Deficiency - Continue supplementation.  5. Depression  6.  Hypothyroidism  Recommended regular exercise, BP monitoring, weight control, and discussed med and SE's. Recommended labs to assess and monitor clinical status. Further disposition pending results of labs.

## 2014-05-27 LAB — LIPID PANEL
Cholesterol: 263 mg/dL — ABNORMAL HIGH (ref 0–200)
HDL: 54 mg/dL (ref >39)
LDL Cholesterol: 152 mg/dL — ABNORMAL HIGH (ref 0–99)
Total CHOL/HDL Ratio: 4.9 Ratio
Triglycerides: 283 mg/dL — ABNORMAL HIGH (ref <150)
VLDL: 57 mg/dL — ABNORMAL HIGH (ref 0–40)

## 2014-05-27 LAB — TSH: TSH: 2.134 u[IU]/mL (ref 0.350–4.500)

## 2014-05-27 LAB — HEPATIC FUNCTION PANEL
ALT: 11 U/L (ref 0–35)
AST: 12 U/L (ref 0–37)
Albumin: 4.2 g/dL (ref 3.5–5.2)
Alkaline Phosphatase: 95 U/L (ref 39–117)
Bilirubin, Direct: 0.1 mg/dL (ref 0.0–0.3)
Indirect Bilirubin: 0.2 mg/dL (ref 0.2–1.2)
Total Bilirubin: 0.3 mg/dL (ref 0.2–1.2)
Total Protein: 6.7 g/dL (ref 6.0–8.3)

## 2014-05-27 LAB — BASIC METABOLIC PANEL WITH GFR
BUN: 16 mg/dL (ref 6–23)
CO2: 29 mEq/L (ref 19–32)
Calcium: 9.4 mg/dL (ref 8.4–10.5)
Chloride: 99 mEq/L (ref 96–112)
Creat: 0.59 mg/dL (ref 0.50–1.10)
GFR, Est African American: >89 mL/min
GFR, Est Non African American: >89 mL/min
Glucose, Bld: 93 mg/dL (ref 70–99)
Potassium: 4.2 mEq/L (ref 3.5–5.3)
Sodium: 139 mEq/L (ref 135–145)

## 2014-05-27 LAB — RHEUMATOID FACTOR: Rhuematoid fact SerPl-aCnc: <10 IU/mL (ref <=14)

## 2014-05-27 LAB — MAGNESIUM: Magnesium: 1.8 mg/dL (ref 1.5–2.5)

## 2014-05-27 LAB — INSULIN, FASTING: Insulin fasting, serum: 5.4 u[IU]/mL (ref 2.0–19.6)

## 2014-05-27 LAB — SEDIMENTATION RATE: Sed Rate: 17 mm/hr (ref 0–22)

## 2014-05-27 LAB — ANTI-DNA ANTIBODY, DOUBLE-STRANDED: ds DNA Ab: 1 IU/mL

## 2014-05-27 LAB — VITAMIN D 25 HYDROXY (VIT D DEFICIENCY, FRACTURES): Vit D, 25-Hydroxy: 75 ng/mL (ref 30–89)

## 2014-05-28 ENCOUNTER — Other Ambulatory Visit: Payer: Self-pay | Admitting: Internal Medicine

## 2014-05-28 ENCOUNTER — Other Ambulatory Visit: Payer: Self-pay | Admitting: *Deleted

## 2014-05-28 LAB — CYCLIC CITRUL PEPTIDE ANTIBODY, IGG: Cyclic Citrullin Peptide Ab: <2 U/mL (ref 0.0–5.0)

## 2014-05-28 MED ORDER — ATORVASTATIN CALCIUM 80 MG PO TABS: ORAL_TABLET | ORAL | Status: DC

## 2014-06-03 ENCOUNTER — Other Ambulatory Visit: Payer: Self-pay

## 2014-06-03 ENCOUNTER — Telehealth: Payer: Self-pay | Admitting: Internal Medicine

## 2014-06-03 DIAGNOSIS — K648 Other hemorrhoids: Secondary | ICD-10-CM

## 2014-06-03 NOTE — Telephone Encounter (Signed)
Dr. Hilarie Fredrickson this pt wants a referral to see a surgeon for hemorrhoids. Which surgeon would you like to refer her to? Please advise.

## 2014-06-03 NOTE — Telephone Encounter (Signed)
Referral made to [facility] and left message for new pt coordinator to call back regarding appt.

## 2014-06-03 NOTE — Telephone Encounter (Signed)
Dr. Johney Maine or Dr. Marcello Moores

## 2014-06-04 NOTE — Telephone Encounter (Signed)
Pt scheduled to see Dr. Marcello Moores 06/22/14@11am . Pt aware.

## 2014-07-17 ENCOUNTER — Other Ambulatory Visit: Payer: Self-pay | Admitting: Physician Assistant

## 2014-07-17 ENCOUNTER — Other Ambulatory Visit: Payer: Self-pay | Admitting: Internal Medicine

## 2014-07-17 DIAGNOSIS — F988 Other specified behavioral and emotional disorders with onset usually occurring in childhood and adolescence: Secondary | ICD-10-CM

## 2014-07-17 MED ORDER — AMPHETAMINE-DEXTROAMPHETAMINE 20 MG PO TABS: 20.0000 mg | ORAL_TABLET | Freq: Three times a day (TID) | ORAL | Status: DC

## 2014-08-17 ENCOUNTER — Ambulatory Visit: Payer: Medicare Other | Admitting: Internal Medicine

## 2014-08-17 NOTE — Progress Notes (Signed)
Patient ID: Margaret Nelson, female   DOB: 04-27-1951, 63 y.o.   MRN: [id number]   R E [facility] D

## 2014-08-20 ENCOUNTER — Other Ambulatory Visit: Payer: Self-pay | Admitting: Physician Assistant

## 2014-08-24 ENCOUNTER — Other Ambulatory Visit: Payer: Self-pay | Admitting: *Deleted

## 2014-08-24 ENCOUNTER — Ambulatory Visit: Payer: Self-pay | Admitting: Internal Medicine

## 2014-08-24 ENCOUNTER — Other Ambulatory Visit: Payer: Self-pay | Admitting: Internal Medicine

## 2014-08-24 MED ORDER — POTASSIUM CHLORIDE CRYS ER 20 MEQ PO TBCR: 20.0000 meq | EXTENDED_RELEASE_TABLET | Freq: Three times a day (TID) | ORAL | Status: DC

## 2014-08-24 MED ORDER — LEVOTHYROXINE SODIUM 50 MCG PO TABS: ORAL_TABLET | ORAL | Status: DC

## 2014-08-24 MED ORDER — ALPRAZOLAM 1 MG PO TABS: ORAL_TABLET | ORAL | Status: AC

## 2014-09-08 ENCOUNTER — Ambulatory Visit: Payer: Medicare Other | Admitting: Internal Medicine

## 2014-09-08 NOTE — Progress Notes (Signed)
Patient ID: Margaret Nelson, female   DOB: [birth year], 63 y.o.   MRN: [id number]  Margaret Nelson

## 2014-09-24 ENCOUNTER — Other Ambulatory Visit: Payer: Self-pay | Admitting: Internal Medicine

## 2014-10-12 ENCOUNTER — Other Ambulatory Visit: Payer: Self-pay | Admitting: Internal Medicine

## 2014-10-12 DIAGNOSIS — F988 Other specified behavioral and emotional disorders with onset usually occurring in childhood and adolescence: Secondary | ICD-10-CM

## 2014-10-12 MED ORDER — AMPHETAMINE-DEXTROAMPHETAMINE 20 MG PO TABS: 20.0000 mg | ORAL_TABLET | Freq: Three times a day (TID) | ORAL | Status: DC

## 2014-11-12 ENCOUNTER — Other Ambulatory Visit: Payer: Self-pay | Admitting: *Deleted

## 2014-11-12 MED ORDER — ALPRAZOLAM 1 MG PO TABS: 1.0000 mg | ORAL_TABLET | Freq: Four times a day (QID) | ORAL | Status: DC | PRN

## 2014-11-16 ENCOUNTER — Other Ambulatory Visit: Payer: Self-pay | Admitting: Internal Medicine

## 2014-11-16 DIAGNOSIS — F988 Other specified behavioral and emotional disorders with onset usually occurring in childhood and adolescence: Secondary | ICD-10-CM

## 2014-11-16 MED ORDER — AMPHETAMINE-DEXTROAMPHETAMINE 20 MG PO TABS: 20.0000 mg | ORAL_TABLET | Freq: Three times a day (TID) | ORAL | Status: DC

## 2014-11-17 ENCOUNTER — Telehealth: Payer: Self-pay | Admitting: *Deleted

## 2014-11-17 ENCOUNTER — Other Ambulatory Visit: Payer: Self-pay | Admitting: Internal Medicine

## 2014-11-17 NOTE — Telephone Encounter (Signed)
Per patient, OK to give written RX to Catalina Lunger.

## 2014-11-23 ENCOUNTER — Telehealth: Payer: Self-pay | Admitting: *Deleted

## 2014-11-23 MED ORDER — AZITHROMYCIN 250 MG PO TABS: ORAL_TABLET | ORAL | Status: DC

## 2014-11-23 NOTE — Telephone Encounter (Signed)
Patient called and complained of sinus drainage with pressure, ear pressure, cough and green mucus x 3 days. OK to send in a Z-pak per Dr Melford Aase.

## 2014-11-24 ENCOUNTER — Other Ambulatory Visit: Payer: Self-pay | Admitting: Internal Medicine

## 2014-11-30 ENCOUNTER — Ambulatory Visit: Payer: Self-pay | Admitting: Internal Medicine

## 2014-12-01 ENCOUNTER — Ambulatory Visit: Payer: Self-pay | Admitting: Internal Medicine

## 2014-12-07 ENCOUNTER — Ambulatory Visit: Payer: Self-pay | Admitting: Internal Medicine

## 2014-12-09 ENCOUNTER — Ambulatory Visit: Payer: Self-pay | Admitting: Internal Medicine

## 2015-01-27 ENCOUNTER — Other Ambulatory Visit (HOSPITAL_COMMUNITY): Payer: Self-pay | Admitting: Obstetrics and Gynecology

## 2015-01-27 DIAGNOSIS — Z6823 Body mass index (BMI) 23.0-23.9, adult: Secondary | ICD-10-CM | POA: Diagnosis not present

## 2015-01-27 DIAGNOSIS — R6882 Decreased libido: Secondary | ICD-10-CM | POA: Diagnosis not present

## 2015-01-27 DIAGNOSIS — Z01419 Encounter for gynecological examination (general) (routine) without abnormal findings: Secondary | ICD-10-CM | POA: Diagnosis not present

## 2015-01-27 DIAGNOSIS — Z779 Other contact with and (suspected) exposures hazardous to health: Secondary | ICD-10-CM | POA: Diagnosis not present

## 2015-01-29 LAB — CYTOLOGY - PAP

## 2015-02-03 ENCOUNTER — Other Ambulatory Visit: Payer: Self-pay | Admitting: Internal Medicine

## 2015-02-15 ENCOUNTER — Ambulatory Visit (INDEPENDENT_AMBULATORY_CARE_PROVIDER_SITE_OTHER): Payer: Medicare Other | Admitting: Internal Medicine

## 2015-02-15 ENCOUNTER — Encounter: Payer: Self-pay | Admitting: Internal Medicine

## 2015-02-15 VITALS — BP 120/72 | HR 80 | Temp 98.1°F | Resp 18 | Ht 63.5 in | Wt 136.2 lb

## 2015-02-15 DIAGNOSIS — E559 Vitamin D deficiency, unspecified: Secondary | ICD-10-CM

## 2015-02-15 DIAGNOSIS — Z789 Other specified health status: Secondary | ICD-10-CM | POA: Diagnosis not present

## 2015-02-15 DIAGNOSIS — F329 Major depressive disorder, single episode, unspecified: Secondary | ICD-10-CM | POA: Diagnosis not present

## 2015-02-15 DIAGNOSIS — E079 Disorder of thyroid, unspecified: Secondary | ICD-10-CM

## 2015-02-15 DIAGNOSIS — K219 Gastro-esophageal reflux disease without esophagitis: Secondary | ICD-10-CM

## 2015-02-15 DIAGNOSIS — I10 Essential (primary) hypertension: Secondary | ICD-10-CM

## 2015-02-15 DIAGNOSIS — Z9181 History of falling: Secondary | ICD-10-CM

## 2015-02-15 DIAGNOSIS — E782 Mixed hyperlipidemia: Secondary | ICD-10-CM

## 2015-02-15 DIAGNOSIS — F32A Depression, unspecified: Secondary | ICD-10-CM

## 2015-02-15 DIAGNOSIS — R7309 Other abnormal glucose: Secondary | ICD-10-CM | POA: Diagnosis not present

## 2015-02-15 DIAGNOSIS — Z1212 Encounter for screening for malignant neoplasm of rectum: Secondary | ICD-10-CM

## 2015-02-15 DIAGNOSIS — Z79899 Other long term (current) drug therapy: Secondary | ICD-10-CM | POA: Diagnosis not present

## 2015-02-15 DIAGNOSIS — R7303 Prediabetes: Secondary | ICD-10-CM

## 2015-02-15 DIAGNOSIS — Z1331 Encounter for screening for depression: Secondary | ICD-10-CM

## 2015-02-15 MED ORDER — RANITIDINE HCL 300 MG PO TABS: ORAL_TABLET | ORAL | Status: DC

## 2015-02-15 MED ORDER — METHYLPHENIDATE HCL 20 MG PO TABS: ORAL_TABLET | ORAL | Status: DC

## 2015-02-15 NOTE — Progress Notes (Signed)
Patient ID: Margaret Nelson, female   DOB: 1951-05-01, 64 y.o.   MRN: [id number]  Annual Comprehensive Examination  This very nice 64 y.o. DWF presents for complete physical.  Patient has been followed for HTN, Prediabetes, Hyperlipidemia, and Vitamin D Deficiency.    HTN predates since 2008. Patient's BP has been controlled at home and patient denies any cardiac symptoms as chest pain, palpitations, shortness of breath, dizziness or ankle swelling. Today's BP: 120/72 mmHg    Patient's hyperlipidemia is not controlled with diet and medications. Patient denies myalgias or other medication SE's. Last lipids were not at goal - Total  Chol 263; HDL-C 54; very high LDL 152; and with elevated Trig 283 on 05/26/2014.   Patient has prediabetes predating with A1c 5.7% since  2012 and patient denies reactive hypoglycemic symptoms, visual blurring, diabetic polys, or paresthesias. Last A1c was 5.9% on 05/26/2014.   Finally, patient has history of Vitamin D Deficiency of 36 in 2008 and last Vitamin D was 75 on 05/26/2014.      Medication Sig  . ALPRAZolam  1 MG tablet Take 1 tablet (1 mg total) by mouth 4 (four) times daily as needed.  . ADDERALL 20 MG tablet Take 1 tablet  3  times daily For severe depression  . atorvastatin (LIPITOR) 80 MG tablet Take 1/2 to 1 tablet daily  for Cholesterol  . bumetanide (BUMEX) 2 MG tablet Take 1 tablet (2 mg total) by mouth 2 (two) times daily. For BP and Fluid  . buPROPion XL 300 MG 24 hr tablet take 1 tablet by mouth every morning for MOOD  . cholecalciferol 2000 UNITS tablet Take 1 tablet (2,000 Units total) by mouth 3 (three) times daily.  Marland Kitchen FLAX SEED OIL 1300 MG CAPS Take 1,300 mg by mouth 3 (three) times daily.  Marland Kitchen levothyroxine  50 MCG tablet take 1 to 1&1/2 tablets daily as directed  . magnesium gluconate  500 MG tablet Take 500 mg by mouth daily.  . meloxicam  15 MG tablet Take 1 tablet (15 mg total) by mouth daily. with food for pain & inflammation  . FISH OIL 1000  MG CAPS Take 1 capsule (1,000 mg total) by mouth 3 (three) times daily.  . pantoprazole  40 MG tablet Take 1 tablet (40 mg total) by mouth daily.  . KCl / (K-DUR 20 MEQ tablet take 1 tablet by mouth three times a day  . sertraline  100 MG tablet take 2 tablets by mouth once daily at bedtime   Allergies  Allergen Reactions  . Codeine Other (See Comments)    Abdominal pain  . Erythromycin Base     GI upset  . Penicillins Rash   Past Medical History  Diagnosis Date  . Hypertension   . Hyperlipidemia   . Depression   . Thyroid disease     hypo  . Allergy   . Anxiety   . Arthritis   . Asthma     as a child, out grew   . Blood transfusion without reported diagnosis   . Cancer     basal cell left temple, left side of nose  . GERD (gastroesophageal reflux disease)    Health Maintenance  Topic Date Due  . HIV Screening  12/12/1967  . MAMMOGRAM  03/31/2012  . ZOSTAVAX  12/11/2012  . TETANUS/TDAP  09/25/2014  . INFLUENZA VACCINE  04/26/2015  . COLONOSCOPY  04/13/2017  . PAP SMEAR  01/26/2018   Immunization History  Administered Date(s) Administered  .  Pneumococcal-Unspecified 09/25/2004  . Td 09/25/2004   Past Surgical History  Procedure Laterality Date  . Cholecystectomy  1975  . Nasal sinus surgery  1996  . Liposuction  1998    with tummy tuck  . Breast surgery Bilateral 1983    implants  . Breast reconstruction  2008  . Appendectomy    . Bil breast lumpectomy    . Bilateral breast masectomy  no breast cancer    bil breast removed to prevent ca.  . Eus N/A 04/23/2014    Procedure: LOWER ENDOSCOPIC ULTRASOUND (EUS);  Surgeon: Milus Banister, MD;  Location: [facility];  Service: Endoscopy;  Laterality: N/A;   Family History  Problem Relation Age of Onset  . COPD Mother   . Lung cancer Mother   . Emphysema Mother   . Early death Father     murdered at 9  . Early death Sister     suicide at 57  . Colon cancer Neg Hx   . Esophageal cancer Neg Hx   .  Pancreatic cancer Neg Hx   . Rectal cancer Neg Hx   . Stomach cancer Neg Hx   . Diabetes Sister    History  Substance Use Topics  . Smoking status: Current Every Day Smoker -- 0.50 packs/day  . Smokeless tobacco: Never Used  . Alcohol Use: Yes     Comment: occasional    ROS Constitutional: Denies fever, chills, weight loss/gain, headaches, insomnia,  night sweats, and change in appetite. Does c/o fatigue. Eyes: Denies redness, blurred vision, diplopia, discharge, itchy, watery eyes.  ENT: Denies discharge, congestion, post nasal drip, epistaxis, sore throat, earache, hearing loss, dental pain, Tinnitus, Vertigo, Sinus pain, snoring.  Cardio: Denies chest pain, palpitations, irregular heartbeat, syncope, dyspnea, diaphoresis, orthopnea, PND, claudication, edema Respiratory: denies cough, dyspnea, DOE, pleurisy, hoarseness, laryngitis, wheezing.  Gastrointestinal: Denies dysphagia, heartburn, reflux, water brash, pain, cramps, nausea, vomiting, bloating, diarrhea, constipation, hematemesis, melena, hematochezia, jaundice, hemorrhoids Genitourinary: Denies dysuria, frequency, urgency, nocturia, hesitancy, discharge, hematuria, flank pain Breast: Breast lumps, nipple discharge, bleeding.  Musculoskeletal: Denies arthralgia, myalgia, stiffness, Jt. Swelling, pain, limp, and strain/sprain. Denies falls. Skin: Denies puritis, rash, hives, warts, acne, eczema, changing in skin lesion Neuro: No weakness, tremor, incoordination, spasms, paresthesia, pain Psychiatric: Denies confusion, memory loss, sensory loss. Denies Depression. Endocrine: Denies change in weight, skin, hair change, nocturia, and paresthesia, diabetic polys, visual blurring, hyper / hypo glycemic episodes.  Heme/Lymph: No excessive bleeding, bruising, enlarged lymph nodes.  Physical Exam  BP 120/72   Pulse 80  Temp 98.1 F  Resp 18  Ht 5' 3.5"   Wt 136 lb 3.2 oz     BMI 23.75   General Appearance: Well nourished and in  no apparent distress. Eyes: PERRLA, EOMs, conjunctiva no swelling or erythema, normal fundi and vessels. Sinuses: No frontal/maxillary tenderness ENT/Mouth: EACs patent / TMs  nl. Nares clear without erythema, swelling, mucoid exudates. Oral hygiene is good. No erythema, swelling, or exudate. Tongue normal, non-obstructing. Tonsils not swollen or erythematous. Hearing normal.  Neck: Supple, thyroid normal. No bruits, nodes or JVD. Respiratory: Respiratory effort normal.  BS equal and clear bilateral without rales, rhonci, wheezing or stridor. Cardio: Heart sounds are normal with regular rate and rhythm and no murmurs, rubs or gallops. Peripheral pulses are normal and equal bilaterally without edema. No aortic or femoral bruits. Chest: symmetric with normal excursions and percussion. Breasts: Recent exam by her GYN.   Abdomen: Flat, soft, with bowl sounds. Nontender, no guarding,  rebound, hernias, masses, or organomegaly.  Lymphatics: Non tender without lymphadenopathy.  Genitourinary: Recent exam by her GYN. Musculoskeletal: Full ROM all peripheral extremities, joint stability, 5/5 strength, and normal gait. Skin: Warm and dry without rashes, lesions, cyanosis, clubbing or  ecchymosis.  Neuro: Cranial nerves intact, reflexes equal bilaterally. Normal muscle tone, no cerebellar symptoms. Sensation intact.  Pysch: Awake and oriented X 3, normal affect, Insight and Judgment appropriate.   Assessment and Plan  1. Essential hypertension  - Microalbumin / creatinine urine ratio - EKG 12-Lead - [place], RETROPERITNL ABD,  LTD - TSH  2. Hyperlipidemia  - Lipid panel  3. Prediabetes  - Hemoglobin A1c - Insulin, random  4. Vitamin D deficiency  - Vit D  25 hydroxy   5. Hypothyroidism   6. Depression screen  - Screen Negative  7. Depression, controlled  - d/c Adderall due to patient reporting some dysphoria  - methylphenidate (RITALIN) 20 MG tablet; Take 1/2 to 1 tablet 3 x day for  Depression  Dispense: 90 tablet; Refill: 0  8. Gastroesophageal reflux disease  - Discussed transition to H2 blockers.   - ranitidine (ZANTAC) 300 MG tablet; Take 1 to 2 tablets daily for heartburn & reflux to allow wean and transition from PPI / pantoprazole  Dispense: 180 tablet; Refill: 99  9. Screening for rectal cancer  - POC Hemoccult Bld/Stl   10. At low risk for fall   11. Medication management  - Urine Microscopic - CBC with Differential/Platelet - BASIC METABOLIC PANEL WITH GFR - Hepatic function panel - Magnesium   Continue prudent diet as discussed, weight control, BP monitoring, regular exercise, and medications. Discussed med's effects and SE's. Screening labs and tests as requested with regular follow-up as recommended.  Over 40 minutes of exam, counseling, chart review was performed.

## 2015-02-15 NOTE — Patient Instructions (Addendum)
GETTING OFF OF PPI's    Protonix or Pantoprazole are called PPI's, they are great at healing your stomach but should only be taken for a short period of time.     Recent studies have shown that taken for a long time they  can increase the risk of osteoporosis (weakening of your bones), pneumonia, low magnesium, restless legs, Cdiff (infection that causes diarrhea), DEMENTIA and most recently kidney damage / disease / insufficiency.     Due to this information we want to try to stop the PPI but if you try to stop it abruptly this can cause rebound acid and worsening symptoms.   So this is how we want you to get off the PPI:  - Start taking the Protonix  to  every other day with  zantac (ranitidine) for 1 or 2 x a day for 2-4 weeks  - then decrease the Protonix  to every 3 days while taking the zantac (ranitidine) twice a day the other  days for 2-4  Weeks  - then you can try the zantac (ranitidine) once at night or up to 2 x day as needed.  - you can continue on this once at night or stop all together  - Avoid alcohol, spicy foods, NSAIDS (aleve, ibuprofen) at this time. See foods below.   +++++++++++++++++++++++++++++++++++++++++++  Food Choices for Gastroesophageal Reflux Disease  When you have gastroesophageal reflux disease (GERD), the foods you eat and your eating habits are very important. Choosing the right foods can help ease the discomfort of GERD.  WHAT GENERAL GUIDELINES DO I NEED TO FOLLOW?   Choose fruits, vegetables, whole grains, low-fat dairy products, and low-fat meat, fish, and poultry.  Limit fats such as oils, salad dressings, butter, nuts, and avocado.  Keep a food diary to identify foods that cause symptoms.  Avoid foods that cause reflux. These may be different for different people.  Eat frequent small meals instead of three large meals each day.  Eat your meals slowly, in a relaxed setting.  Limit fried foods.  Cook foods using methods other than  frying.  Avoid drinking alcohol.  Avoid drinking large amounts of liquids with your meals.  Avoid bending over or lying down until 2-3 hours after eating.   WHAT FOODS ARE NOT RECOMMENDED? The following are some foods and drinks that may worsen your symptoms:  Vegetables Tomatoes. Tomato juice. Tomato and spaghetti sauce. Chili peppers. Onion and garlic. Horseradish. Fruits Oranges, grapefruit, and lemon (fruit and juice). Especially no BANANNAs Meats High-fat meats, fish, and poultry. This includes hot dogs, ribs, ham, sausage, salami, and bacon. Dairy Whole milk and chocolate milk. Sour cream. Cream. Butter. Ice cream. Cream cheese.  Beverages Coffee and tea, with or without caffeine. Carbonated beverages or energy drinks. Condiments Hot sauce. Barbecue sauce.  Sweets/Desserts Chocolate and cocoa. Donuts. Peppermint and spearmint. Fats and Oils High-fat foods, including [place] fries and potato chips. Other Vinegar. Strong spices, such as black pepper, white pepper, red pepper, cayenne, curry powder, cloves, ginger, and chili powder. Nexium/protonix/prilosec are called PPI's, they are great at healing your stomach but should only be taken for a short period of time.   +++++++++++++++++++++++++++++++++++++++++++++++++++++  Recommend Adult Low dose Aspirin or baby Aspirin 81 mg daily   To reduce risk of Colon Cancer 20 %,   Skin Cancer 26 % ,   Melanoma 46%   and   Pancreatic cancer 60%  ++++++++++++++++++  Vitamin D goal is between 70-100.   Please make sure  that you are taking your Vitamin D as directed.   It is very important as a natural anti-inflammatory   helping hair, skin, and nails, as well as reducing stroke and heart attack risk.   It helps your bones and helps with mood.  It also decreases numerous cancer risks so please take it as directed.   Low Vit D is associated with a 200-300% higher risk for CANCER   and 200-300% higher risk for HEART    ATTACK  &  STROKE.    .....................................Marland Kitchen  It is also associated with higher death rate at younger ages,   autoimmune diseases like Rheumatoid arthritis, Lupus, Multiple Sclerosis.     Also many other serious conditions, like depression, Alzheimer's  Dementia, infertility, muscle aches, fatigue, fibromyalgia - just to name a few.  +++++++++++++++++++    Recommend the book "The END of DIETING" by Dr Excell Seltzer   & the book "The END of DIABETES " by Dr Excell Seltzer  At [email or web address] - get book & Audio CD's     Being diabetic has a  300% increased risk for heart attack, stroke, cancer, and alzheimer- type vascular dementia. It is very important that you work harder with diet by avoiding all foods that are white. Avoid white rice (brown & wild rice is OK), white potatoes (sweetpotatoes in moderation is OK), White bread or wheat bread or anything made out of white flour like bagels, donuts, rolls, buns, biscuits, cakes, pastries, cookies, pizza crust, and pasta (made from white flour & egg whites) - vegetarian pasta or spinach or wheat pasta is OK. Multigrain breads like Arnold's or [employer], or multigrain sandwich thins or flatbreads.  Diet, exercise and weight loss can reverse and cure diabetes in the early stages.  Diet, exercise and weight loss is very important in the control and prevention of complications of diabetes which affects every system in your body, ie. Brain - dementia/stroke, eyes - glaucoma/blindness, heart - heart attack/heart failure, kidneys - dialysis, stomach - gastric paralysis, intestines - malabsorption, nerves - severe painful neuritis, circulation - gangrene & loss of a leg(s), and finally cancer and Alzheimers.    I recommend avoid fried & greasy foods,  sweets/candy, white rice (brown or wild rice or Quinoa is OK), white potatoes (sweet potatoes are OK) - anything made from white flour - bagels, doughnuts, rolls, buns, biscuits,white and wheat  breads, pizza crust and traditional pasta made of white flour & egg white(vegetarian pasta or spinach or wheat pasta is OK).  Multi-grain bread is OK - like multi-grain flat bread or sandwich thins. Avoid alcohol in excess. Exercise is also important.    Eat all the vegetables you want - avoid meat, especially red meat and dairy - especially cheese.  Cheese is the most concentrated form of trans-fats which is the worst thing to clog up our arteries. Veggie cheese is OK which can be found in the fresh produce section at [facility] or [employer] or [employer]  ++++++++++++++++++++++++++  Preventive Care for Adults  A healthy lifestyle and preventive care can promote health and wellness. Preventive health guidelines for women include the following key practices.  A routine yearly physical is a good way to check with your health care provider about your health and preventive screening. It is a chance to share any concerns and updates on your health and to receive a thorough exam.  Visit your dentist for a routine exam and preventive care every 6 months. Brush your teeth twice  a day and floss once a day. Good oral hygiene prevents tooth decay and gum disease.  The frequency of eye exams is based on your age, health, family medical history, use of contact lenses, and other factors. Follow your health care provider's recommendations for frequency of eye exams.  Eat a healthy diet. Foods like vegetables, fruits, whole grains, low-fat dairy products, and lean protein foods contain the nutrients you need without too many calories. Decrease your intake of foods high in solid fats, added sugars, and salt. Eat the right amount of calories for you.Get information about a proper diet from your health care provider, if necessary.  Regular physical exercise is one of the most important things you can do for your health. Most adults should get at least 150 minutes of moderate-intensity exercise (any activity  that increases your heart rate and causes you to sweat) each week. In addition, most adults need muscle-strengthening exercises on 2 or more days a week.  Maintain a healthy weight. The body mass index (BMI) is a screening tool to identify possible weight problems. It provides an estimate of body fat based on height and weight. Your health care provider can find your BMI and can help you achieve or maintain a healthy weight.For adults 20 years and older:  A BMI below 18.5 is considered underweight.  A BMI of 18.5 to 24.9 is normal.  A BMI of 25 to 29.9 is considered overweight.  A BMI of 30 and above is considered obese.  Maintain normal blood lipids and cholesterol levels by exercising and minimizing your intake of saturated fat. Eat a balanced diet with plenty of fruit and vegetables. Blood tests for lipids and cholesterol should begin at age 83 and be repeated every 5 years. If your lipid or cholesterol levels are high, you are over 50, or you are at high risk for heart disease, you may need your cholesterol levels checked more frequently.Ongoing high lipid and cholesterol levels should be treated with medicines if diet and exercise are not working.  If you smoke, find out from your health care provider how to quit. If you do not use tobacco, do not start.  Lung cancer screening is recommended for adults aged 94-80 years who are at high risk for developing lung cancer because of a history of smoking. A yearly low-dose CT scan of the lungs is recommended for people who have at least a 30-pack-year history of smoking and are a current smoker or have quit within the past 15 years. A pack year of smoking is smoking an average of 1 pack of cigarettes a day for 1 year (for example: 1 pack a day for 30 years or 2 packs a day for 15 years). Yearly screening should continue until the smoker has stopped smoking for at least 15 years. Yearly screening should be stopped for people who develop a health  problem that would prevent them from having lung cancer treatment.  High blood pressure causes heart disease and increases the risk of stroke. Your blood pressure should be checked at least every 1 to 2 years. Ongoing high blood pressure should be treated with medicines if weight loss and exercise do not work.  If you are 81-4 years old, ask your health care provider if you should take aspirin to prevent strokes.  Diabetes screening involves taking a blood sample to check your fasting blood sugar level. This should be done once every 3 years, after age 59, if you are within normal weight and  without risk factors for diabetes. Testing should be considered at a younger age or be carried out more frequently if you are overweight and have at least 1 risk factor for diabetes.  Breast cancer screening is essential preventive care for women. You should practice "breast self-awareness." This means understanding the normal appearance and feel of your breasts and may include breast self-examination. Any changes detected, no matter how small, should be reported to a health care provider. Women in their 27s and 30s should have a clinical breast exam (CBE) by a health care provider as part of a regular health exam every 1 to 3 years. After age 59, women should have a CBE every year. Starting at age 53, women should consider having a mammogram (breast X-ray test) every year. Women who have a family history of breast cancer should talk to their health care provider about genetic screening. Women at a high risk of breast cancer should talk to their health care providers about having an MRI and a mammogram every year.  Breast cancer gene (BRCA)-related cancer risk assessment is recommended for women who have family members with BRCA-related cancers. BRCA-related cancers include breast, ovarian, tubal, and peritoneal cancers. Having family members with these cancers may be associated with an increased risk for harmful  changes (mutations) in the breast cancer genes BRCA1 and BRCA2. Results of the assessment will determine the need for genetic counseling and BRCA1 and BRCA2 testing.  Routine pelvic exams to screen for cancer are no longer recommended for nonpregnant women who are considered low risk for cancer of the pelvic organs (ovaries, uterus, and vagina) and who do not have symptoms. Ask your health care provider if a screening pelvic exam is right for you.  If you have had past treatment for cervical cancer or a condition that could lead to cancer, you need Pap tests and screening for cancer for at least 20 years after your treatment. If Pap tests have been discontinued, your risk factors (such as having a new sexual partner) need to be reassessed to determine if screening should be resumed. Some women have medical problems that increase the chance of getting cervical cancer. In these cases, your health care provider may recommend more frequent screening and Pap tests.  Colorectal cancer can be detected and often prevented. Most routine colorectal cancer screening begins at the age of 85 years and continues through age 70 years. However, your health care provider may recommend screening at an earlier age if you have risk factors for colon cancer. On a yearly basis, your health care provider may provide home test kits to check for hidden blood in the stool. Use of a small camera at the end of a tube, to directly examine the colon (sigmoidoscopy or colonoscopy), can detect the earliest forms of colorectal cancer. Talk to your health care provider about this at age 66, when routine screening begins. Direct exam of the colon should be repeated every 5-10 years through age 40 years, unless early forms of pre-cancerous polyps or small growths are found.  Hepatitis C blood testing is recommended for all people born from [birth year] through 1963 and any individual with known risks for hepatitis C.  Pra  Osteoporosis is a  disease in which the bones lose minerals and strength with aging. This can result in serious bone fractures or breaks. The risk of osteoporosis can be identified using a bone density scan. Women ages 16 years and over and women at risk for fractures or osteoporosis should discuss  screening with their health care providers. Ask your health care provider whether you should take a calcium supplement or vitamin D to reduce the rate of osteoporosis.  Menopause can be associated with physical symptoms and risks. Hormone replacement therapy is available to decrease symptoms and risks. You should talk to your health care provider about whether hormone replacement therapy is right for you.  Use sunscreen. Apply sunscreen liberally and repeatedly throughout the day. You should seek shade when your shadow is shorter than you. Protect yourself by wearing long sleeves, pants, a wide-brimmed hat, and sunglasses year round, whenever you are outdoors.  Once a month, do a whole body skin exam, using a mirror to look at the skin on your back. Tell your health care provider of new moles, moles that have irregular borders, moles that are larger than a pencil eraser, or moles that have changed in shape or color.  Stay current with required vaccines (immunizations).  Influenza vaccine. All adults should be immunized every year.  Tetanus, diphtheria, and acellular pertussis (Td, Tdap) vaccine. Pregnant women should receive 1 dose of Tdap vaccine during each pregnancy. The dose should be obtained regardless of the length of time since the last dose. Immunization is preferred during the 27th-36th week of gestation. An adult who has not previously received Tdap or who does not know her vaccine status should receive 1 dose of Tdap. This initial dose should be followed by tetanus and diphtheria toxoids (Td) booster doses every 10 years. Adults with an unknown or incomplete history of completing a 3-dose immunization series with  Td-containing vaccines should begin or complete a primary immunization series including a Tdap dose. Adults should receive a Td booster every 10 years.  Varicella vaccine. An adult without evidence of immunity to varicella should receive 2 doses or a second dose if she has previously received 1 dose. Pregnant females who do not have evidence of immunity should receive the first dose after pregnancy. This first dose should be obtained before leaving the health care facility. The second dose should be obtained 4-8 weeks after the first dose.  Human papillomavirus (HPV) vaccine. Females aged 13-26 years who have not received the vaccine previously should obtain the 3-dose series. The vaccine is not recommended for use in pregnant females. However, pregnancy testing is not needed before receiving a dose. If a female is found to be pregnant after receiving a dose, no treatment is needed. In that case, the remaining doses should be delayed until after the pregnancy. Immunization is recommended for any person with an immunocompromised condition through the age of 72 years if she did not get any or all doses earlier. During the 3-dose series, the second dose should be obtained 4-8 weeks after the first dose. The third dose should be obtained 24 weeks after the first dose and 16 weeks after the second dose.  Zoster vaccine. One dose is recommended for adults aged 22 years or older unless certain conditions are present.  Measles, mumps, and rubella (MMR) vaccine. Adults born before [birth year] generally are considered immune to measles and mumps. Adults born in [birth year] or later should have 1 or more doses of MMR vaccine unless there is a contraindication to the vaccine or there is laboratory evidence of immunity to each of the three diseases. A routine second dose of MMR vaccine should be obtained at least 28 days after the first dose for students attending postsecondary schools, health care workers, or international travelers.  People who received inactivated measles  vaccine or an unknown type of measles vaccine during 1963-1967 should receive 2 doses of MMR vaccine. People who received inactivated mumps vaccine or an unknown type of mumps vaccine before 1979 and are at high risk for mumps infection should consider immunization with 2 doses of MMR vaccine. For females of childbearing age, rubella immunity should be determined. If there is no evidence of immunity, females who are not pregnant should be vaccinated. If there is no evidence of immunity, females who are pregnant should delay immunization until after pregnancy. Unvaccinated health care workers born before [birth year] who lack laboratory evidence of measles, mumps, or rubella immunity or laboratory confirmation of disease should consider measles and mumps immunization with 2 doses of MMR vaccine or rubella immunization with 1 dose of MMR vaccine.  Pneumococcal 13-valent conjugate (PCV13) vaccine. When indicated, a person who is uncertain of her immunization history and has no record of immunization should receive the PCV13 vaccine. An adult aged 73 years or older who has certain medical conditions and has not been previously immunized should receive 1 dose of PCV13 vaccine. This PCV13 should be followed with a dose of pneumococcal polysaccharide (PPSV23) vaccine. The PPSV23 vaccine dose should be obtained at least 8 weeks after the dose of PCV13 vaccine. An adult aged 32 years or older who has certain medical conditions and previously received 1 or more doses of PPSV23 vaccine should receive 1 dose of PCV13. The PCV13 vaccine dose should be obtained 1 or more years after the last PPSV23 vaccine dose.    Pneumococcal polysaccharide (PPSV23) vaccine. When PCV13 is also indicated, PCV13 should be obtained first. All adults aged 100 years and older should be immunized. An adult younger than age 63 years who has certain medical conditions should be immunized. Any person who resides in a  nursing home or long-term care facility should be immunized. An adult smoker should be immunized. People with an immunocompromised condition and certain other conditions should receive both PCV13 and PPSV23 vaccines. People with human immunodeficiency virus (HIV) infection should be immunized as soon as possible after diagnosis. Immunization during chemotherapy or radiation therapy should be avoided. Routine use of PPSV23 vaccine is not recommended for [place] Indians, [place] Natives, or people younger than 65 years unless there are medical conditions that require PPSV23 vaccine. When indicated, people who have unknown immunization and have no record of immunization should receive PPSV23 vaccine. One-time revaccination 5 years after the first dose of PPSV23 is recommended for people aged 19-64 years who have chronic kidney failure, nephrotic syndrome, asplenia, or immunocompromised conditions. People who received 1-2 doses of PPSV23 before age 26 years should receive another dose of PPSV23 vaccine at age 53 years or later if at least 5 years have passed since the previous dose. Doses of PPSV23 are not needed for people immunized with PPSV23 at or after age 71 years.  Preventive Services / Frequency   Ages 26 to 52 years  Blood pressure check.  Lipid and cholesterol check.  Lung cancer screening. / Every year if you are aged 62-80 years and have a 30-pack-year history of smoking and currently smoke or have quit within the past 15 years. Yearly screening is stopped once you have quit smoking for at least 15 years or develop a health problem that would prevent you from having lung cancer treatment.  Clinical breast exam.** / Every year after age 31 years.  BRCA-related cancer risk assessment.** / For women who have family members with a BRCA-related cancer (breast,  ovarian, tubal, or peritoneal cancers).  Mammogram.** / Every year beginning at age 5 years and continuing for as long as you are in  good health. Consult with your health care provider.  Pap test.** / Every 3 years starting at age 62 years through age 13 or 58 years with a history of 3 consecutive normal Pap tests.  HPV screening.** / Every 3 years from ages 52 years through ages 47 to 36 years with a history of 3 consecutive normal Pap tests.  Fecal occult blood test (FOBT) of stool. / Every year beginning at age 23 years and continuing until age 44 years. You may not need to do this test if you get a colonoscopy every 10 years.  Flexible sigmoidoscopy or colonoscopy.** / Every 5 years for a flexible sigmoidoscopy or every 10 years for a colonoscopy beginning at age 41 years and continuing until age 31 years.  Hepatitis C blood test.** / For all people born from [birth year] through 1963 and any individual with known risks for hepatitis C.  Skin self-exam. / Monthly.  Influenza vaccine. / Every year.  Tetanus, diphtheria, and acellular pertussis (Tdap/Td) vaccine.** / Consult your health care provider. Pregnant women should receive 1 dose of Tdap vaccine during each pregnancy. 1 dose of Td every 10 years.  Varicella vaccine.** / Consult your health care provider. Pregnant females who do not have evidence of immunity should receive the first dose after pregnancy.  Zoster vaccine.** / 1 dose for adults aged 24 years or older.  Pneumococcal 13-valent conjugate (PCV13) vaccine.** / Consult your health care provider.  Pneumococcal polysaccharide (PPSV23) vaccine.** / 1 to 2 doses if you smoke cigarettes or if you have certain conditions.  Meningococcal vaccine.** / Consult your health care provider.  Hepatitis A vaccine.** / Consult your health care provider.  Hepatitis B vaccine.** / Consult your health care provider. Screening for abdominal aortic aneurysm (AAA)  by ultrasound is recommended for people over 50 who have history of high blood pressure or who are current or former smokers.

## 2015-02-16 LAB — CBC WITH DIFFERENTIAL/PLATELET
Basophils Absolute: 0.1 10*3/uL (ref 0.0–0.1)
Basophils Relative: 1 % (ref 0–1)
Eosinophils Absolute: 0.2 10*3/uL (ref 0.0–0.7)
Eosinophils Relative: 2 % (ref 0–5)
HCT: 38.6 % (ref 36.0–46.0)
Hemoglobin: 13.1 g/dL (ref 12.0–15.0)
Lymphocytes Relative: 40 % (ref 12–46)
Lymphs Abs: 4.3 10*3/uL — ABNORMAL HIGH (ref 0.7–4.0)
MCH: 30.6 pg (ref 26.0–34.0)
MCHC: 33.9 g/dL (ref 30.0–36.0)
MCV: 90.2 fL (ref 78.0–100.0)
MPV: 10.9 fL (ref 8.6–12.4)
Monocytes Absolute: 0.5 10*3/uL (ref 0.1–1.0)
Monocytes Relative: 5 % (ref 3–12)
Neutro Abs: 5.6 10*3/uL (ref 1.7–7.7)
Neutrophils Relative %: 52 % (ref 43–77)
Platelets: 303 10*3/uL (ref 150–400)
RBC: 4.28 MIL/uL (ref 3.87–5.11)
RDW: 13.6 % (ref 11.5–15.5)
WBC: 10.7 10*3/uL — ABNORMAL HIGH (ref 4.0–10.5)

## 2015-02-16 LAB — BASIC METABOLIC PANEL WITH GFR
BUN: 20 mg/dL (ref 6–23)
CO2: 26 mEq/L (ref 19–32)
Calcium: 8.9 mg/dL (ref 8.4–10.5)
Chloride: 99 mEq/L (ref 96–112)
Creat: 0.68 mg/dL (ref 0.50–1.10)
GFR, Est African American: >89 mL/min
GFR, Est Non African American: >89 mL/min
Glucose, Bld: 84 mg/dL (ref 70–99)
Potassium: 4.2 mEq/L (ref 3.5–5.3)
Sodium: 139 mEq/L (ref 135–145)

## 2015-02-16 LAB — URINALYSIS, MICROSCOPIC ONLY
Bacteria, UA: NONE SEEN
Casts: NONE SEEN
Crystals: NONE SEEN
Squamous Epithelial / LPF: NONE SEEN

## 2015-02-16 LAB — LIPID PANEL
Cholesterol: 200 mg/dL (ref 0–200)
HDL: 64 mg/dL (ref >=46)
LDL Cholesterol: 84 mg/dL (ref 0–99)
Total CHOL/HDL Ratio: 3.1 Ratio
Triglycerides: 260 mg/dL — ABNORMAL HIGH (ref <150)
VLDL: 52 mg/dL — ABNORMAL HIGH (ref 0–40)

## 2015-02-16 LAB — MICROALBUMIN / CREATININE URINE RATIO
Creatinine, Urine: 35.7 mg/dL
Microalb Creat Ratio: 5.6 mg/g (ref 0.0–30.0)
Microalb, Ur: 0.2 mg/dL (ref <2.0)

## 2015-02-16 LAB — HEPATIC FUNCTION PANEL
ALT: 13 U/L (ref 0–35)
AST: 14 U/L (ref 0–37)
Albumin: 4 g/dL (ref 3.5–5.2)
Alkaline Phosphatase: 92 U/L (ref 39–117)
Bilirubin, Direct: 0.1 mg/dL (ref 0.0–0.3)
Indirect Bilirubin: 0.3 mg/dL (ref 0.2–1.2)
Total Bilirubin: 0.4 mg/dL (ref 0.2–1.2)
Total Protein: 6.6 g/dL (ref 6.0–8.3)

## 2015-02-16 LAB — INSULIN, RANDOM: Insulin: 9.3 u[IU]/mL (ref 2.0–19.6)

## 2015-02-16 LAB — HEMOGLOBIN A1C
Hgb A1c MFr Bld: 5.9 % — ABNORMAL HIGH (ref <5.7)
Mean Plasma Glucose: 123 mg/dL — ABNORMAL HIGH (ref <117)

## 2015-02-16 LAB — MAGNESIUM: Magnesium: 1.8 mg/dL (ref 1.5–2.5)

## 2015-02-16 LAB — TSH: TSH: 2.415 u[IU]/mL (ref 0.350–4.500)

## 2015-02-16 LAB — VITAMIN D 25 HYDROXY (VIT D DEFICIENCY, FRACTURES): Vit D, 25-Hydroxy: 57 ng/mL (ref 30–100)

## 2015-02-23 ENCOUNTER — Other Ambulatory Visit: Payer: Self-pay | Admitting: Internal Medicine

## 2015-03-30 ENCOUNTER — Other Ambulatory Visit: Payer: Self-pay | Admitting: Internal Medicine

## 2015-03-30 DIAGNOSIS — H2513 Age-related nuclear cataract, bilateral: Secondary | ICD-10-CM | POA: Diagnosis not present

## 2015-03-30 DIAGNOSIS — F329 Major depressive disorder, single episode, unspecified: Secondary | ICD-10-CM

## 2015-03-30 DIAGNOSIS — H04123 Dry eye syndrome of bilateral lacrimal glands: Secondary | ICD-10-CM | POA: Diagnosis not present

## 2015-03-30 DIAGNOSIS — D3132 Benign neoplasm of left choroid: Secondary | ICD-10-CM | POA: Diagnosis not present

## 2015-03-30 DIAGNOSIS — F32A Depression, unspecified: Secondary | ICD-10-CM

## 2015-03-30 MED ORDER — METHYLPHENIDATE HCL 20 MG PO TABS: ORAL_TABLET | ORAL | Status: DC

## 2015-04-14 ENCOUNTER — Ambulatory Visit (INDEPENDENT_AMBULATORY_CARE_PROVIDER_SITE_OTHER): Payer: Medicare Other | Admitting: Physician Assistant

## 2015-04-14 ENCOUNTER — Encounter: Payer: Self-pay | Admitting: Physician Assistant

## 2015-04-14 VITALS — BP 128/68 | HR 80 | Temp 98.6°F | Resp 16 | Ht 63.5 in | Wt 135.8 lb

## 2015-04-14 DIAGNOSIS — Z9181 History of falling: Secondary | ICD-10-CM

## 2015-04-14 DIAGNOSIS — R7303 Prediabetes: Secondary | ICD-10-CM

## 2015-04-14 DIAGNOSIS — F329 Major depressive disorder, single episode, unspecified: Secondary | ICD-10-CM

## 2015-04-14 DIAGNOSIS — Z6823 Body mass index (BMI) 23.0-23.9, adult: Secondary | ICD-10-CM

## 2015-04-14 DIAGNOSIS — Z1211 Encounter for screening for malignant neoplasm of colon: Secondary | ICD-10-CM

## 2015-04-14 DIAGNOSIS — F17200 Nicotine dependence, unspecified, uncomplicated: Secondary | ICD-10-CM | POA: Insufficient documentation

## 2015-04-14 DIAGNOSIS — Z79899 Other long term (current) drug therapy: Secondary | ICD-10-CM

## 2015-04-14 DIAGNOSIS — Z1212 Encounter for screening for malignant neoplasm of rectum: Secondary | ICD-10-CM

## 2015-04-14 DIAGNOSIS — I10 Essential (primary) hypertension: Secondary | ICD-10-CM

## 2015-04-14 DIAGNOSIS — M79645 Pain in left finger(s): Secondary | ICD-10-CM

## 2015-04-14 DIAGNOSIS — R6889 Other general symptoms and signs: Secondary | ICD-10-CM | POA: Diagnosis not present

## 2015-04-14 DIAGNOSIS — Z0000 Encounter for general adult medical examination without abnormal findings: Secondary | ICD-10-CM

## 2015-04-14 DIAGNOSIS — R7309 Other abnormal glucose: Secondary | ICD-10-CM | POA: Diagnosis not present

## 2015-04-14 DIAGNOSIS — Z0001 Encounter for general adult medical examination with abnormal findings: Secondary | ICD-10-CM | POA: Diagnosis not present

## 2015-04-14 DIAGNOSIS — E079 Disorder of thyroid, unspecified: Secondary | ICD-10-CM

## 2015-04-14 DIAGNOSIS — E559 Vitamin D deficiency, unspecified: Secondary | ICD-10-CM

## 2015-04-14 DIAGNOSIS — E782 Mixed hyperlipidemia: Secondary | ICD-10-CM

## 2015-04-14 DIAGNOSIS — K219 Gastro-esophageal reflux disease without esophagitis: Secondary | ICD-10-CM

## 2015-04-14 DIAGNOSIS — F32A Depression, unspecified: Secondary | ICD-10-CM

## 2015-04-14 MED ORDER — TRAMADOL HCL 50 MG PO TABS: ORAL_TABLET | ORAL | Status: DC

## 2015-04-14 MED ORDER — PREDNISONE 20 MG PO TABS: ORAL_TABLET | ORAL | Status: AC

## 2015-04-14 NOTE — Patient Instructions (Signed)
De Quervain's Disease De Quervain's disease is a condition often seen in racquet sports where there is a soreness (inflammation) in the cord like structures (tendons) which attach muscle to bone on the thumb side of the wrist. There may be a tightening of the tissuesaround the tendons. This condition is often helped by giving up or modifying the activity which caused it. When conservative treatment does not help, surgery may be required. Conservative treatment could include changes in the activity which brought about the problem or made it worse. Anti-inflammatory medications and injections may be used to help decrease the inflammation and help with pain control. Your caregiver will help you determine which is best for you. DIAGNOSIS  Often the diagnosis (learning what is wrong) can be made by examination. Sometimes x-rays are required. HOME CARE INSTRUCTIONS   Apply ice to the sore area for 15-20 minutes, 03-04 times per day while awake. Put the ice in a plastic bag and place a towel between the bag of ice and your skin. This is especially helpful if it can be done after all activities involving the sore wrist.  Temporary splinting may help.  Only take over-the-counter or prescription medicines for pain, discomfort or fever as directed by your caregiver. SEEK MEDICAL CARE IF:   Pain relief is not obtained with medications, or if you have increasing pain and seem to be getting worse rather than better. MAKE SURE YOU:   Understand these instructions.  Will watch your condition.  Will get help right away if you are not doing well or get worse. Document Released: 06/06/2001 Document Revised: 12/04/2011 Document Reviewed: 01/14/2014 Exit[employer] Patient Information 2015 [employer]. This information is not intended to replace advice given to you by your health care provider. Make sure you discuss any questions you have with your health care provider.  

## 2015-04-14 NOTE — Progress Notes (Signed)
MEDICARE ANNUAL WELLNESS VISIT AND FOLLOW UP  Assessment:   1. Tobacco use disorder Smoking cessation-  instruction/counseling given, counseled patient on the dangers of tobacco use, advised patient to stop smoking, and reviewed strategies to maximize success, patient not ready to quit at this time.   2. Prediabetes Discussed general issues about diabetes pathophysiology and management., Educational material distributed., Suggested low cholesterol diet., Encouraged aerobic exercise., Discussed foot care., Reminded to get yearly retinal exam. - [facility] DIABETES FOOT EXAM  3. Essential hypertension - continue medications, DASH diet, exercise and monitor at home. Call if greater than 130/80.   4. Hyperlipidemia -continue medications, check lipids, decrease fatty foods, increase activity.   5. Hypothyroidism Hypothyroidism-check TSH level, continue medications the same, reminded to take on an empty stomach 30-14mins before food.   6. Medication management  7. Vitamin D deficiency Continue supplement  8. Depression, controlled remission  9. Gastroesophageal reflux disease, esophagitis presence not specified Continue PPI/H2 blocker, diet discussed  10. Thumb pain, left Likely OA versus DeQurevain's RICE, splint, and take medications, if not better will refer to ortho - predniSONE (DELTASONE) 20 MG tablet; 1 pill 3 x a day for 3 days, 1 pill 2 x a day x 3 days, 1 pill a day x 5 days with food  Dispense: 20 tablet; Refill: 0 - traMADol (ULTRAM) 50 MG tablet; 1 pill twice daily as needed for pain  Dispense: 30 tablet; Refill: 0  11. Encounter for Medicare annual wellness exam Will get Tetanus at CPE, declines today  Over 30 minutes of exam, counseling, chart review, and critical decision making was performed  Plan:   During the course of the visit the patient was educated and counseled about appropriate screening and preventive services including:    Pneumococcal vaccine    Influenza vaccine  Td vaccine  Prevnar 13  Screening electrocardiogram  Screening mammography  Bone densitometry screening  Colorectal cancer screening  Diabetes screening  Glaucoma screening  Nutrition counseling   Advanced directives: given info/requested copies  Conditions/risks identified: Diabetes is at goal, ACE/ARB therapy: No, Reason not on Ace Inhibitor/ARB therapy:  only PreDM Urinary Incontinence is not an issue: discussed non pharmacology and pharmacology options.  Fall risk: low- discussed PT, home fall assessment, medications.    Subjective:   Margaret Nelson is a 64 y.o. female who presents for Medicare Annual Wellness Visit and for hand evaluation Date of last medicare wellness visit is unknown.   Her blood pressure has been controlled at home, today their BP is BP: 128/68 mmHg She does workout. She denies chest pain, shortness of breath, dizziness.  She is on cholesterol medication and denies myalgias. Her cholesterol is at goal. The cholesterol last visit was:   Lab Results  Component Value Date   CHOL 200 02/15/2015   HDL 64 02/15/2015   LDLCALC 84 02/15/2015   TRIG 260* 02/15/2015   CHOLHDL 3.1 02/15/2015   She has been working on diet and exercise for prediabetes, and denies paresthesia of the feet, polydipsia, polyuria and visual disturbances. Last A1C in the office was:  Lab Results  Component Value Date   HGBA1C 5.9* 02/15/2015   Patient is on Vitamin D supplement. Lab Results  Component Value Date   VD25OH 30 02/15/2015     She is on thyroid medication. Her medication was not changed last visit.   Lab Results  Component Value Date   TSH 2.415 02/15/2015  .  She is on protonix for GERD She is  on wellbutrin/zoloft and xanax for anxiety/depression.  S/p bilateral mastectomy with reconstruction in the 70's.  She is right handed, complains of left hand pain, has been moving into new house, has been lifting boxes and painting.  Yesterday mid day started to have sharp pain in her left hand radial side, getting worse, no radiation, some tingling but no numbness Worse with pressure on her thenar/thumb, hurts too bad to move it. Has tried ice, moving it but not help, has taken tylenol arthritis without help.   Medication Review Current Outpatient Prescriptions on File Prior to Visit  Medication Sig Dispense Refill  . ALPRAZolam (XANAX) 1 MG tablet Take 1 tablet (1 mg total) by mouth 4 (four) times daily as needed. 120 tablet 3  . atorvastatin (LIPITOR) 80 MG tablet Take 1/2 to 1 tablet daily  for Cholesterol - 30 tablet 99  . bumetanide (BUMEX) 2 MG tablet Take 1 tablet (2 mg total) by mouth 2 (two) times daily. For BP and Fluid 180 tablet 99  . bumetanide (BUMEX) 2 MG tablet take 1 tablet by mouth twice a day 180 tablet 1  . buPROPion (WELLBUTRIN XL) 300 MG 24 hr tablet take 1 tablet by mouth every morning for MOOD 90 tablet PRN  . cholecalciferol 2000 UNITS tablet Take 1 tablet (2,000 Units total) by mouth 3 (three) times daily.    . Flaxseed, Linseed, (FLAX SEED OIL) 1300 MG CAPS Take 1,300 mg by mouth 3 (three) times daily.    Marland Kitchen levothyroxine (SYNTHROID, LEVOTHROID) 50 MCG tablet TAKE 1 TO 1 AND 1/2 TABLET BY MOUTH ONCE A DAY 90 tablet 3  . magnesium gluconate (MAGONATE) 500 MG tablet Take 500 mg by mouth daily.    . meloxicam (MOBIC) 15 MG tablet Take 1 tablet (15 mg total) by mouth daily. with food for pain & inflammation 90 tablet 99  . methylphenidate (RITALIN) 20 MG tablet Take 1/2 to 1 tablet 3 x day for Depression 90 tablet 0  . Omega-3 Fatty Acids (FISH OIL) 1000 MG CAPS Take 1 capsule (1,000 mg total) by mouth 3 (three) times daily.  0  . pantoprazole (PROTONIX) 40 MG tablet Take 1 tablet (40 mg total) by mouth daily. 90 tablet 3  . potassium chloride SA (K-DUR,KLOR-CON) 20 MEQ tablet take 1 tablet by mouth three times a day 270 tablet 1  . ranitidine (ZANTAC) 300 MG tablet Take 1 to 2 tablets daily for  heartburn & reflux to allow wean and transition from PPI / pantoprazole 180 tablet 99  . sertraline (ZOLOFT) 100 MG tablet take 2 tablets by mouth once daily at bedtime 180 tablet 1   No current facility-administered medications on file prior to visit.    Current Problems (verified) Patient Active Problem List   Diagnosis Date Noted  . Tobacco use disorder 04/14/2015  . GERD  02/15/2015  . Medication management 02/12/2014  . Hypothyroidism   . Essential hypertension 08/18/2013  . Hyperlipidemia 08/18/2013  . Depression, controlled 08/18/2013  . Vitamin D deficiency 08/18/2013  . Prediabetes 08/18/2013    Screening Tests Immunization History  Administered Date(s) Administered  . Pneumococcal-Unspecified 09/25/2004  . Td 09/25/2004    Preventative care: Last colonoscopy: 03/2014 Last mammogram: 02/2010, last one due to bilateral mastesctomy, [place] left breast 02/2014 normal.  Last pap smear/pelvic exam: 01/2015 DEXA: NEVER, will get age 73  Prior vaccinations: TD or Tdap: 2006 DUE this year  Influenza: 2013  Pneumococcal: 2006 Prevnar13: due age 75 Shingles/Zostavax: N/A  Names of  Other Physician/Practitioners you currently use: 1. [facility]- here for primary care 2. Dr. Schuyler Amor, eye doctor, last visit 03/2015 3. Dr. Langley Gauss dentist, q 6 month Dr. [facility]- use to  Patient Care Team: Unk Pinto, MD as PCP - General (Internal Medicine) Dian Queen, MD as Consulting Physician (Obstetrics and Gynecology) Milus Banister, MD as Attending Physician (Gastroenterology)  Past Surgical History  Procedure Laterality Date  . Cholecystectomy  1975  . Nasal sinus surgery  1996  . Liposuction  1998    with tummy tuck  . Breast surgery Bilateral 1983    implants  . Breast reconstruction  2008  . Appendectomy    . Bil breast lumpectomy    . Bilateral breast masectomy  no breast cancer    bil breast removed to prevent ca.  . Eus N/A  04/23/2014    Procedure: LOWER ENDOSCOPIC ULTRASOUND (EUS);  Surgeon: Milus Banister, MD;  Location: [facility];  Service: Endoscopy;  Laterality: N/A;   Family History  Problem Relation Age of Onset  . COPD Mother   . Lung cancer Mother   . Emphysema Mother   . Early death Father     murdered at 6  . Early death Sister     suicide at 69  . Colon cancer Neg Hx   . Esophageal cancer Neg Hx   . Pancreatic cancer Neg Hx   . Rectal cancer Neg Hx   . Stomach cancer Neg Hx   . Diabetes Sister    History  Substance Use Topics  . Smoking status: Current Every Day Smoker -- 0.50 packs/day  . Smokeless tobacco: Never Used  . Alcohol Use: Yes     Comment: occasional    MEDICARE WELLNESS OBJECTIVES: Tobacco use: She does smoke.  Patient is not a former smoker. If yes, counseling given Alcohol Current alcohol use: occ Osteoporosis: postmenopausal estrogen deficiency and dietary calcium and/or vitamin D deficiency, History of fracture in the past year: no Fall risk: Low Risk Hearing: normal Visual acuity: impaired,  does perform annual eye exam Diet: in general, a "healthy" diet   Physical activity: Current Exercise Habits:: The patient does not participate in regular exercise at present Cardiac risk factors: Cardiac Risk Factors include: advanced age (>69men, >5 women);dyslipidemia;family history of premature cardiovascular disease;sedentary lifestyle Depression/mood screen:   Depression screen [facility] 2/9 04/14/2015  Decreased Interest 0  Down, Depressed, Hopeless 0  PHQ - 2 Score 0  Altered sleeping -  Tired, decreased energy -  Change in appetite -  Feeling bad or failure about yourself  -  Trouble concentrating -  Moving slowly or fidgety/restless -  Suicidal thoughts -  PHQ-9 Score -    ADLs:  In your present state of health, do you have any difficulty performing the following activities: 04/14/2015 02/15/2015  Hearing? N N  Vision? N N  Difficulty concentrating or making  decisions? N N  Walking or climbing stairs? N N  Dressing or bathing? N N  Doing errands, shopping? N N  Preparing Food and eating ? N -  Using the Toilet? N -  In the past six months, have you accidently leaked urine? N -  Do you have problems with loss of bowel control? N -  Managing your Medications? N -  Managing your Finances? N -  Housekeeping or managing your Housekeeping? N -     Cognitive Testing  Alert? Yes  Normal Appearance?Yes  Oriented to person? Yes  Place?  Yes   Time? Yes  Recall of three objects?  Yes  Can perform simple calculations? Yes  Displays appropriate judgment?Yes  Can read the correct time from a watch face?Yes  EOL planning: Does patient have an advance directive?: No Would patient like information on creating an advanced directive?: No - patient declined information   Objective:   Today's Vitals   04/14/15 1627  BP: 128/68  Pulse: 80  Temp: 98.6 F (37 C)  Resp: 16  Height: 5' 3.5" (1.613 m)  Weight: 135 lb 12.8 oz (61.598 kg)   Body mass index is 23.68 kg/(m^2).  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  Extremities: no edema, no cyanosis, no clubbing Pulses: 2+ symmetric, upper and lower extremities, normal cap refill Neurological: alert, oriented x 3, CN2-12 intact, strength normal upper extremities and lower extremities, sensation normal throughout,  DTRs 2+ throughout, no cerebellar signs, gait normal Psychiatric: normal affect, behavior normal, pleasant  Breast: defer Gyn: defer Rectal: defer   Medicare Attestation I have personally reviewed: The patient's medical and social history Their use of alcohol, tobacco or illicit drugs Their current medications and supplements The patient's functional ability including ADLs,fall risks, home safety risks, cognitive, and hearing and visual impairment Diet and physical activities Evidence for depression or mood disorders  The patient's weight, height, BMI, and visual acuity have been recorded in the chart.  I have made referrals, counseling, and provided education to the patient based on review of the above and I have provided the patient with a written personalized care plan for preventive services.     Vicie Mutters, PA-C   04/14/2015

## 2015-04-29 ENCOUNTER — Other Ambulatory Visit: Payer: Self-pay | Admitting: Internal Medicine

## 2015-04-29 DIAGNOSIS — F329 Major depressive disorder, single episode, unspecified: Secondary | ICD-10-CM

## 2015-04-29 DIAGNOSIS — F32A Depression, unspecified: Secondary | ICD-10-CM

## 2015-04-29 MED ORDER — METHYLPHENIDATE HCL 20 MG PO TABS: ORAL_TABLET | ORAL | Status: DC

## 2015-05-07 ENCOUNTER — Other Ambulatory Visit: Payer: Self-pay | Admitting: Internal Medicine

## 2015-05-07 DIAGNOSIS — F419 Anxiety disorder, unspecified: Secondary | ICD-10-CM

## 2015-05-07 MED ORDER — ALPRAZOLAM 1 MG PO TABS: ORAL_TABLET | ORAL | Status: DC

## 2015-05-17 ENCOUNTER — Other Ambulatory Visit: Payer: Self-pay | Admitting: Internal Medicine

## 2015-05-19 ENCOUNTER — Ambulatory Visit: Payer: Self-pay | Admitting: Internal Medicine

## 2015-05-23 ENCOUNTER — Other Ambulatory Visit: Payer: Self-pay | Admitting: Internal Medicine

## 2015-05-23 DIAGNOSIS — I10 Essential (primary) hypertension: Secondary | ICD-10-CM

## 2015-05-23 DIAGNOSIS — R609 Edema, unspecified: Secondary | ICD-10-CM

## 2015-06-01 ENCOUNTER — Ambulatory Visit: Payer: Self-pay | Admitting: Internal Medicine

## 2015-06-07 ENCOUNTER — Other Ambulatory Visit: Payer: Self-pay | Admitting: *Deleted

## 2015-06-07 DIAGNOSIS — F329 Major depressive disorder, single episode, unspecified: Secondary | ICD-10-CM

## 2015-06-07 DIAGNOSIS — F32A Depression, unspecified: Secondary | ICD-10-CM

## 2015-06-07 MED ORDER — METHYLPHENIDATE HCL 20 MG PO TABS: ORAL_TABLET | ORAL | Status: DC

## 2015-06-22 ENCOUNTER — Encounter: Payer: Self-pay | Admitting: Internal Medicine

## 2015-06-22 ENCOUNTER — Ambulatory Visit (INDEPENDENT_AMBULATORY_CARE_PROVIDER_SITE_OTHER): Payer: Medicare Other | Admitting: Internal Medicine

## 2015-06-22 ENCOUNTER — Ambulatory Visit (HOSPITAL_COMMUNITY)
Admission: RE | Admit: 2015-06-22 | Discharge: 2015-06-22 | Disposition: A | Discharge status: Home / Self Care | Payer: Medicare Other | Source: Ambulatory Visit | Attending: Internal Medicine | Admitting: Internal Medicine

## 2015-06-22 VITALS — BP 122/70 | HR 84 | Temp 98.0°F | Resp 18 | Ht 63.5 in | Wt 132.0 lb

## 2015-06-22 DIAGNOSIS — M79672 Pain in left foot: Secondary | ICD-10-CM | POA: Insufficient documentation

## 2015-06-22 DIAGNOSIS — Z23 Encounter for immunization: Secondary | ICD-10-CM | POA: Diagnosis not present

## 2015-06-22 MED ORDER — HYDROCODONE-ACETAMINOPHEN 5-325 MG PO TABS: 1.0000 | ORAL_TABLET | Freq: Four times a day (QID) | ORAL | Status: DC | PRN

## 2015-06-22 MED ORDER — MELOXICAM 15 MG PO TABS: 15.0000 mg | ORAL_TABLET | Freq: Every day | ORAL | Status: DC

## 2015-06-22 MED ORDER — DEXAMETHASONE SODIUM PHOSPHATE 100 MG/10ML IJ SOLN
10.0000 mg | Freq: Once | INTRAMUSCULAR | Status: AC
  Administered 2015-06-22: 10 mg via INTRAMUSCULAR

## 2015-06-22 NOTE — Progress Notes (Signed)
   Subjective:    Patient ID: Margaret Nelson, female    DOB: [birth year], 64 y.o.   MRN: [id number]  Foot Pain Associated symptoms include arthralgias and joint swelling. Pertinent negatives include no chills, fatigue, fever or numbness.  Patient presents to the office for evaluation of foot pain x 3 days of the left foot.  She reports that she recently has increased her milage of walking over the last two weeks.  She reports no injury that she can think of.  She has had some pain all over the midfoot.  She reports that the pain is sharp stabbing pain.  Pain with resting.  It has been swelling over the last two days.  She has tried ice and elevation.  She has not taken any medications for it.  She has never had an injury to that foot remotely.  She has had a very difficult time walking with it.  She reports that she did buy new shoes the day that she started experiencing pain.    Review of Systems  Constitutional: Negative for fever, chills and fatigue.  Musculoskeletal: Positive for joint swelling and arthralgias.  Skin: Negative for color change.  Neurological: Negative for numbness.       Objective:   Physical Exam  Constitutional: She appears well-developed and well-nourished. No distress.  HENT:  Head: Normocephalic.  Mouth/Throat: Oropharynx is clear and moist. No oropharyngeal exudate.  Eyes: Conjunctivae are normal. No scleral icterus.  Neck: Normal range of motion. Neck supple. No JVD present. No thyromegaly present.  Cardiovascular: Normal rate, regular rhythm, normal heart sounds and intact distal pulses.  Exam reveals no gallop and no friction rub.   No murmur heard. Pulmonary/Chest: Effort normal and breath sounds normal. No respiratory distress. She has no wheezes. She has no rales. She exhibits no tenderness.  Musculoskeletal:       Left ankle: She exhibits decreased range of motion and swelling. She exhibits no ecchymosis, no deformity, no laceration and normal pulse.  Tenderness. Head of 5th metatarsal tenderness found. No lateral malleolus, no medial malleolus, no AITFL, no CF ligament, no posterior TFL and no proximal fibula tenderness found. Achilles tendon normal.       Feet:  Lymphadenopathy:    She has no cervical adenopathy.  Skin: She is not diaphoretic.  Nursing note and vitals reviewed.         Assessment & Plan:    1. Left foot pain -norco -mobic -decadron - DG Foot Complete Left; Future

## 2015-06-22 NOTE — Addendum Note (Signed)
Addended by: HELMER, REBECCA A on: 06/22/2015 04:19 PM   Modules accepted: Orders

## 2015-06-22 NOTE — Patient Instructions (Signed)
Peroneal Tendinitis with Rehab Tendonitis is inflammation of a tendon. Inflammation of the tendons on the back of the outer ankle (peroneal tendons) is known as peroneal tendonitis. The peroneal tendons are responsible for connecting the muscles that allow you to stand on your tiptoes to the bones of the ankle. For this reason, peroneal tendonitis often causes pain when trying to complete such motions. Peroneal tendonitis often involves a tear (strain) of the peroneal tendons. Strains are classified into three categories. Grade 1 strains cause pain, but the tendon is not lengthened. Grade 2 strains include a lengthened ligament, due to the ligament being stretched or partially ruptured. With grade 2 strains there is still function, although function may be decreased. Grade 3 strains involve a complete tear of the tendon or muscle, and function is usually impaired. SYMPTOMS   Pain, tenderness, swelling, warmth, or redness over the back of the outer side of the ankle, the outer part of the mid-foot, or the bottom of the arch.  Pain that gets worse with ankle motion (especially when pushing off or pushing down with the front of the foot), or when standing on the ball of the foot or pushing the foot outward.  Crackling sound (crepitation) when the tendon is moved or touched. CAUSES  Peroneal tendinitis occurs when injury to the peroneal tendons causes the body to respond with inflammation. Common causes of injury include:  An overuse injury, in which the groove behind the outer ankle (where the tendon is located) causes wear on the tendon.  A sudden stress placed on the tendon, such as from an increase in the intensity, frequency, or duration of training.  Direct hit (trauma) to the tendon.  Return to activity too soon after a previous ankle injury. RISK INCREASES WITH:  Sports that require sudden, repetitive pushing off of the foot, such as jumping or quick starts.  Kicking and running sports,  especially running down hills or long distances.  Poor strength and flexibility.  Previous injury to the foot, ankle, or leg. PREVENTION  Warm up and stretch properly before activity.  Allow for adequate recovery between workouts.  Maintain physical fitness:  Strength, flexibility, and endurance.  Cardiovascular fitness.  Complete rehabilitation after previous injury. PROGNOSIS  If treated properly, peroneal tendonitis usually heals within 6 weeks.  RELATED COMPLICATIONS  Longer healing time, if not properly treated or if not given enough time to heal.  Recurring symptoms if activity is resumed too soon, with overuse, or when using poor technique.  If untreated, tendinitis may result in tendon rupture, requiring surgery. TREATMENT  Treatment first involves the use of ice and medicine to reduce pain and inflammation. The use of strengthening and stretching exercises may help reduce pain with activity. These exercises may be performed at home or with a therapist. Sometimes, the foot and ankle will be restrained for 10 to 14 days to promote healing. Your caregiver may advise that you place a heel lift in your shoes to reduce the stress placed on the tendon. If nonsurgical treatment is unsuccessful, surgery to remove the inflamed tendon lining (sheath) may be advised.  MEDICATION   If pain medicine is needed, nonsteroidal anti-inflammatory medicines (aspirin and ibuprofen), or other minor pain relievers (acetaminophen), are often advised.  Do not take pain medicine for 7 days before surgery.  Prescription pain relievers may be given, if your caregiver thinks they are needed. Use only as directed and only as much as you need. HEAT AND COLD  Cold treatment (icing) should   be applied for 10 to 15 minutes every 2 to 3 hours for inflammation and pain, and immediately after activity that aggravates your symptoms. Use ice packs or an ice massage.  Heat treatment may be used before  performing stretching and strengthening activities prescribed by your caregiver, physical therapist, or athletic trainer. Use a heat pack or a warm water soak. SEEK MEDICAL CARE IF:  Symptoms get worse or do not improve in 2 to 4 weeks, despite treatment.  New, unexplained symptoms develop. (Drugs used in treatment may produce side effects.) EXERCISES RANGE OF MOTION (ROM) AND STRETCHING EXERCISES - Peroneal Tendinitis These exercises may help you when beginning to rehabilitate your injury. Your symptoms may resolve with or without further involvement from your physician, physical therapist or athletic trainer. While completing these exercises, remember:   Restoring tissue flexibility helps normal motion to return to the joints. This allows healthier, less painful movement and activity.  An effective stretch should be held for at least 30 seconds.  A stretch should never be painful. You should only feel a gentle lengthening or release in the stretched tissue. RANGE OF MOTION - Ankle Eversion  Sit with your right / left ankle crossed over your opposite knee.  Grip your foot with your opposite hand, placing your thumb on the top of your foot and your fingers across the bottom of your foot.  Gently push your foot downward with a slight rotation, so your littlest toes rise slightly toward the ceiling.  You should feel a gentle stretch on the inside of your ankle. Hold the stretch for __________ seconds. Repeat __________ times. Complete this exercise __________ times per day.  RANGE OF MOTION - Ankle Inversion  Sit with your right / left ankle crossed over your opposite knee.  Grip your foot with your opposite hand, placing your thumb on the bottom of your foot and your fingers across the top of your foot.  Gently pull your foot so the smallest toe comes toward you and your thumb pushes the inside of the ball of your foot away from you.  You should feel a gentle stretch on the outside of  your ankle. Hold the stretch for __________ seconds. Repeat __________ times. Complete this exercise __________ times per day.  RANGE OF MOTION - Ankle Plantar Flexion  Sit with your right / left leg crossed over your opposite knee.  Use your opposite hand to pull the top of your foot and toes toward you.  You should feel a gentle stretch on the top of your foot and ankle. Hold this position for __________ seconds. Repeat __________ times. Complete __________ times per day.  STRETCH - Gastroc, Standing  Place your hands on a wall.  Extend your right / left leg behind you, keeping the front knee somewhat bent.  Slightly point your toes inward on your back foot.  Keeping your right / left heel on the floor and your knee straight, shift your weight toward the wall, not allowing your back to arch.  You should feel a gentle stretch in the calf. Hold this position for __________ seconds. Repeat __________ times. Complete this stretch __________ times per day. STRETCH - Soleus, Standing  Place your hands on a wall.  Extend your right / left leg behind you, keeping the other knee somewhat bent.  Slightly point your toes inward on your back foot.  Keep your heel on the floor, bend your back knee, and slightly shift your weight over the back leg so that   you feel a gentle stretch deep in your back calf.  Hold this position for __________ seconds. Repeat __________ times. Complete this stretch __________ times per day. STRETCH - Gastrocsoleus, Standing Note: This exercise can place a lot of stress on your foot and ankle. Please complete this exercise only if specifically instructed by your caregiver.   Place the ball of your right / left foot on a step, keeping your other foot firmly on the same step.  Hold on to the wall or a rail for balance.  Slowly lift your other foot, allowing your body weight to press your heel down over the edge of the step.  You should feel a stretch in your  right / left calf.  Hold this position for __________ seconds.  Repeat this exercise with a slight bend in your knee. Repeat __________ times. Complete this stretch __________ times per day.  STRENGTHENING EXERCISES - Peroneal Tendinitis  These exercises may help you when beginning to rehabilitate your injury. They may resolve your symptoms with or without further involvement from your physician, physical therapist or athletic trainer. While completing these exercises, remember:   Muscles can gain both the endurance and the strength needed for everyday activities through controlled exercises.  Complete these exercises as instructed by your physician, physical therapist or athletic trainer. Increase the resistance and repetitions only as guided by your caregiver. STRENGTH - Dorsiflexors  Secure a rubber exercise band or tubing to a fixed object (table, pole) and loop the other end around your right / left foot.  Sit on the floor facing the fixed object. The band should be slightly tense when your foot is relaxed.  Slowly draw your foot back toward you, using your ankle and toes.  Hold this position for __________ seconds. Slowly release the tension in the band and return your foot to the starting position. Repeat __________ times. Complete this exercise __________ times per day.  STRENGTH - Towel Curls  Sit in a chair, on a non-carpeted surface.  Place your foot on a towel, keeping your heel on the floor.  Pull the towel toward your heel only by curling your toes. Keep your heel on the floor.  If instructed by your physician, physical therapist or athletic trainer, add weight to the end of the towel. Repeat __________ times. Complete this exercise __________ times per day. STRENGTH - Ankle Eversion   Secure one end of a rubber exercise band or tubing to a fixed object (table, pole). Loop the other end around your foot, just before your toes.  Place your fists between your knees.  This will focus your strengthening at your ankle.  Drawing the band across your opposite foot, away from the pole, slowly, pull your little toe out and up. Make sure the band is positioned to resist the entire motion.  Hold this position for __________ seconds.  Have your muscles resist the band, as it slowly pulls your foot back to the starting position. Repeat __________ times. Complete this exercise __________ times per day.  Document Released: 09/11/2005 Document Revised: 01/26/2014 Document Reviewed: 12/24/2008 [employer] Patient Information 2015 [employer]. This information is not intended to replace advice given to you by your health care provider. Make sure you discuss any questions you have with your health care provider.  

## 2015-07-29 ENCOUNTER — Encounter: Payer: Self-pay | Admitting: Internal Medicine

## 2015-07-29 ENCOUNTER — Ambulatory Visit (INDEPENDENT_AMBULATORY_CARE_PROVIDER_SITE_OTHER): Payer: Medicare Other | Admitting: Internal Medicine

## 2015-07-29 VITALS — BP 130/76 | HR 92 | Temp 97.7°F | Resp 16 | Ht 63.5 in | Wt 132.2 lb

## 2015-07-29 DIAGNOSIS — R7303 Prediabetes: Secondary | ICD-10-CM | POA: Diagnosis not present

## 2015-07-29 DIAGNOSIS — Z6823 Body mass index (BMI) 23.0-23.9, adult: Secondary | ICD-10-CM | POA: Insufficient documentation

## 2015-07-29 DIAGNOSIS — J45909 Unspecified asthma, uncomplicated: Secondary | ICD-10-CM | POA: Insufficient documentation

## 2015-07-29 DIAGNOSIS — J4520 Mild intermittent asthma, uncomplicated: Secondary | ICD-10-CM | POA: Diagnosis not present

## 2015-07-29 DIAGNOSIS — F17200 Nicotine dependence, unspecified, uncomplicated: Secondary | ICD-10-CM

## 2015-07-29 MED ORDER — FLUTICASONE FUROATE-VILANTEROL 200-25 MCG/INH IN AEPB: 1.0000 | INHALATION_SPRAY | Freq: Every day | RESPIRATORY_TRACT | Status: DC

## 2015-07-29 MED ORDER — PREDNISONE 20 MG PO TABS: ORAL_TABLET | ORAL | Status: DC

## 2015-07-29 NOTE — Progress Notes (Signed)
Subjective:    Patient ID: Margaret Nelson, female    DOB: June 14, 1951, 64 y.o.   MRN: [id number]  HPI  Patient who is a relapsed smoker presents with a 3 mo hx/o intermittent wheezing and dry non-productive cough. No fever, chills, sweats, rashes or sputum. Does admit sone DOE and denies CP's.   Medication Sig  . ALPRAZolam  1 MG tablet TAKE 1 TABLET 4 TIMES DAILY AS NEEDED.  Marland Kitchen bumetanide  2 MG tablet take 1 tablet by mouth twice a day  . buPROPion  XL  300 MG take 1 tablet by mouth every morning for MOOD  . cholecalciferol 2000 UNITS  Take 1 tablet (2,000 Units total) by mouth 3 (three) times daily.  Marland Kitchen FLAX SEED OIL 1300 MG  Take 1,300 mg by mouth 3 (three) times daily.  Lebron Quam 5-325 Take 1 tablet by mouth every 6 (six) hours as needed for moderate pain.  Marland Kitchen levothyroxine  50 MCG tablet TAKE 1 TO 1 AND 1/2 TABLET BY MOUTH ONCE A DAY  . magnesium gluconate  500 MG  Take 500 mg by mouth daily.  . meloxicam  15 MG tablet Take 1 tablet (15 mg total) by mouth daily.  Marland Kitchen FISH OIL 1000 MG  Take 1 capsule (1,000 mg total) by mouth 3 (three) times daily.  . pantoprazole 40 MG tablet Take 1 tablet (40 mg total) by mouth daily.  . ranitidine  300 MG tablet Take 1 to 2 tablets daily for heartburn & reflux to allow wean and transition from PPI / pantoprazole  . sertraline  100 MG tablet take 2 tablets by mouth once daily at bedtime  . methylphenidate  20 MG tablet Take 1/2 to 1 tablet 3 x day for Depression  . potassium chloride SA  20 MEQ take 1 tablet by mouth three times a day   Allergies  Allergen Reactions  . Codeine Other (See Comments)    Abdominal pain  . Erythromycin Base     GI upset  . Penicillins Rash   Past Medical History  Diagnosis Date  . Hypertension   . Hyperlipidemia   . Depression   . Thyroid disease     hypo  . Allergy   . Anxiety   . Arthritis   . Asthma     as a child, out grew   . Blood transfusion without reported diagnosis   . Cancer ([place])     basal cell left  temple, left side of nose  . GERD (gastroesophageal reflux disease)    Past Surgical History  Procedure Laterality Date  . Cholecystectomy  1975  . Nasal sinus surgery  1996  . Liposuction  1998    with tummy tuck  . Breast surgery Bilateral 1983    implants  . Breast reconstruction  2008  . Appendectomy    . Bil breast lumpectomy    . Bilateral breast masectomy  no breast cancer    bil breast removed to prevent ca.  . Eus N/A 04/23/2014    Procedure: LOWER ENDOSCOPIC ULTRASOUND (EUS);  Surgeon: Milus Banister, MD;  Location: [facility];  Service: Endoscopy;  Laterality: N/A;   Review of Systems The patient denies cough, chest pain, dyspnea, wheezing or hemoptysis.    Objective:   Physical Exam  BP 130/76 mmHg  Pulse 92  Temp(Src) 97.7 F (36.5 C)  Resp 16  Ht 5' 3.5" (1.613 m)  Wt 132 lb 3.2 oz (59.966 kg)  BMI 23.05 kg/m2  Skin - clear w/o rash, lesions, cyanosis. No Respiratory distress - dry cough - O2 sat = 94-96% on rm air.   HEENT - Eac's patent. TM's Nl. EOM's full. PERRLA. NasoOroPharynx clear. Neck - supple. Nl Thyroid. Carotids 2+ & No bruits, nodes, JVD Chest - Clear equal BS w/o Rales, rhonchi,sbut there are forced post -tussive expiratory wheezes. Cor - Nl HS. RRR w/o sig MGR. PP 1(+). No edema. Abd - No palpable organomegaly, masses or tenderness. BS nl. MS- FROM w/o deformities. Muscle power, tone and bulk Nl. Gait Nl. Neuro - No obvious Cr N abnormalities. Sensory, motor and Cerebellar functions appear Nl w/o focal abnormalities. Psyche - Mental status normal & appropriate.  No delusions, ideations or obvious mood abnormalities.    Assessment & Plan:   1. Asthma, mild intermittent, uncomplicated  - DG Chest 2 View; Future - predniSONE (DELTASONE) 20 MG tablet; 1 tab 3 x day for 3 days, then 1 tab 2 x day for 3 days, then 1 tab 1 x day for 5 days  Dispense: 20 tablet; Refill: 0 - Fluticasone Furoate-Vilanterol (BREO ELLIPTA) 200-25 MCG/INH AEPB;  Inhale 1 puff into the lungs daily.  Dispense: 28 each; Refill: 0  2. Extrinsic asthma, mild intermittent, uncomplicated  - instructed in use of inhaler  3. Body mass index (BMI) of 23.0-23.9 in adult   4. Prediabetes   5. Tobacco dependence syndrome  - counseled in tobacco cessation benefits and techniques.

## 2015-07-31 ENCOUNTER — Other Ambulatory Visit: Payer: Self-pay | Admitting: Internal Medicine

## 2015-08-02 ENCOUNTER — Ambulatory Visit (HOSPITAL_COMMUNITY)
Admission: RE | Admit: 2015-08-02 | Discharge: 2015-08-02 | Disposition: A | Discharge status: Home / Self Care | Payer: Medicare Other | Source: Ambulatory Visit | Attending: Internal Medicine | Admitting: Internal Medicine

## 2015-08-02 DIAGNOSIS — J4520 Mild intermittent asthma, uncomplicated: Secondary | ICD-10-CM

## 2015-08-02 DIAGNOSIS — J45909 Unspecified asthma, uncomplicated: Secondary | ICD-10-CM | POA: Insufficient documentation

## 2015-08-02 DIAGNOSIS — R05 Cough: Secondary | ICD-10-CM | POA: Insufficient documentation

## 2015-08-13 ENCOUNTER — Other Ambulatory Visit: Payer: Self-pay | Admitting: Internal Medicine

## 2015-08-23 ENCOUNTER — Telehealth: Payer: Self-pay | Admitting: Internal Medicine

## 2015-08-23 ENCOUNTER — Other Ambulatory Visit: Payer: Self-pay | Admitting: Internal Medicine

## 2015-08-23 DIAGNOSIS — M2550 Pain in unspecified joint: Secondary | ICD-10-CM

## 2015-08-23 NOTE — Telephone Encounter (Signed)
The patient called requesting a referral to Rhematologist Dr Gavin Pound. She states that she has O.A., but thinks she needs a rheumatologist due to her right hand fingers popping and locking up, also she complains of both knee aching.  Please advise if office visit to evaluate her is necessary or any other recommendation. She requested Dr Gavin Pound because she has a friend who sees this doctor.  Thank you, Leonie Douglas Referral Coordinator  [facility] [phone] ext. [phone] Fax [phone]

## 2015-08-30 ENCOUNTER — Encounter: Payer: Self-pay | Admitting: Internal Medicine

## 2015-08-30 ENCOUNTER — Ambulatory Visit (INDEPENDENT_AMBULATORY_CARE_PROVIDER_SITE_OTHER): Payer: Medicare Other | Admitting: Internal Medicine

## 2015-08-30 VITALS — BP 116/72 | HR 64 | Temp 97.7°F | Resp 16 | Ht 63.5 in | Wt 131.6 lb

## 2015-08-30 DIAGNOSIS — E782 Mixed hyperlipidemia: Secondary | ICD-10-CM

## 2015-08-30 DIAGNOSIS — Z6823 Body mass index (BMI) 23.0-23.9, adult: Secondary | ICD-10-CM | POA: Diagnosis not present

## 2015-08-30 DIAGNOSIS — R7303 Prediabetes: Secondary | ICD-10-CM

## 2015-08-30 DIAGNOSIS — K219 Gastro-esophageal reflux disease without esophagitis: Secondary | ICD-10-CM | POA: Diagnosis not present

## 2015-08-30 DIAGNOSIS — Z79899 Other long term (current) drug therapy: Secondary | ICD-10-CM

## 2015-08-30 DIAGNOSIS — E559 Vitamin D deficiency, unspecified: Secondary | ICD-10-CM

## 2015-08-30 DIAGNOSIS — F32A Depression, unspecified: Secondary | ICD-10-CM

## 2015-08-30 DIAGNOSIS — F329 Major depressive disorder, single episode, unspecified: Secondary | ICD-10-CM | POA: Diagnosis not present

## 2015-08-30 DIAGNOSIS — I10 Essential (primary) hypertension: Secondary | ICD-10-CM

## 2015-08-30 DIAGNOSIS — R7309 Other abnormal glucose: Secondary | ICD-10-CM | POA: Diagnosis not present

## 2015-08-30 LAB — CBC WITH DIFFERENTIAL/PLATELET
Basophils Absolute: 0.1 10*3/uL (ref 0.0–0.1)
Basophils Relative: 1 % (ref 0–1)
Eosinophils Absolute: 0.4 10*3/uL (ref 0.0–0.7)
Eosinophils Relative: 5 % (ref 0–5)
HCT: 41 % (ref 36.0–46.0)
Hemoglobin: 13.8 g/dL (ref 12.0–15.0)
Lymphocytes Relative: 50 % — ABNORMAL HIGH (ref 12–46)
Lymphs Abs: 3.9 10*3/uL (ref 0.7–4.0)
MCH: 31.1 pg (ref 26.0–34.0)
MCHC: 33.7 g/dL (ref 30.0–36.0)
MCV: 92.3 fL (ref 78.0–100.0)
MPV: 10.5 fL (ref 8.6–12.4)
Monocytes Absolute: 0.3 10*3/uL (ref 0.1–1.0)
Monocytes Relative: 4 % (ref 3–12)
Neutro Abs: 3.1 10*3/uL (ref 1.7–7.7)
Neutrophils Relative %: 40 % — ABNORMAL LOW (ref 43–77)
Platelets: 312 10*3/uL (ref 150–400)
RBC: 4.44 MIL/uL (ref 3.87–5.11)
RDW: 13.8 % (ref 11.5–15.5)
WBC: 7.8 10*3/uL (ref 4.0–10.5)

## 2015-08-30 LAB — BASIC METABOLIC PANEL WITH GFR
BUN: 17 mg/dL (ref 7–25)
CO2: 27 mmol/L (ref 20–31)
Calcium: 9 mg/dL (ref 8.6–10.4)
Chloride: 99 mmol/L (ref 98–110)
Creat: 0.76 mg/dL (ref 0.50–0.99)
GFR, Est African American: >89 mL/min (ref >=60)
GFR, Est Non African American: 84 mL/min (ref >=60)
Glucose, Bld: 82 mg/dL (ref 65–99)
Potassium: 3.7 mmol/L (ref 3.5–5.3)
Sodium: 137 mmol/L (ref 135–146)

## 2015-08-30 LAB — HEPATIC FUNCTION PANEL
ALT: 12 U/L (ref 6–29)
AST: 15 U/L (ref 10–35)
Albumin: 4.4 g/dL (ref 3.6–5.1)
Alkaline Phosphatase: 80 U/L (ref 33–130)
Bilirubin, Direct: <0.1 mg/dL (ref <=0.2)
Total Bilirubin: 0.2 mg/dL (ref 0.2–1.2)
Total Protein: 6.7 g/dL (ref 6.1–8.1)

## 2015-08-30 LAB — MAGNESIUM: Magnesium: 2.1 mg/dL (ref 1.5–2.5)

## 2015-08-30 LAB — LIPID PANEL
Cholesterol: 198 mg/dL (ref 125–200)
HDL: 45 mg/dL — ABNORMAL LOW (ref >=46)
Total CHOL/HDL Ratio: 4.4 Ratio (ref <=5.0)
Triglycerides: 617 mg/dL — ABNORMAL HIGH (ref <150)

## 2015-08-30 LAB — TSH: TSH: 1.42 u[IU]/mL (ref 0.350–4.500)
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
## 2015-08-30 NOTE — Patient Instructions (Signed)

## 2015-08-31 LAB — VITAMIN D 25 HYDROXY (VIT D DEFICIENCY, FRACTURES): Vit D, 25-Hydroxy: 48 ng/mL (ref 30–100)

## 2015-08-31 LAB — HEMOGLOBIN A1C
Hgb A1c MFr Bld: 5.6 % (ref <5.7)
Mean Plasma Glucose: 114 mg/dL (ref <117)

## 2015-08-31 LAB — INSULIN, RANDOM: Insulin: 4 u[IU]/mL (ref 2.0–19.6)

## 2015-09-14 ENCOUNTER — Other Ambulatory Visit: Payer: Self-pay | Admitting: Internal Medicine

## 2015-09-14 ENCOUNTER — Encounter: Payer: Self-pay | Admitting: Internal Medicine

## 2015-09-14 ENCOUNTER — Ambulatory Visit (INDEPENDENT_AMBULATORY_CARE_PROVIDER_SITE_OTHER): Payer: Medicare Other | Admitting: Internal Medicine

## 2015-09-14 VITALS — BP 116/74 | HR 80 | Temp 97.9°F | Resp 16 | Ht 63.5 in | Wt 133.4 lb

## 2015-09-14 DIAGNOSIS — K219 Gastro-esophageal reflux disease without esophagitis: Secondary | ICD-10-CM

## 2015-09-14 DIAGNOSIS — I10 Essential (primary) hypertension: Secondary | ICD-10-CM | POA: Diagnosis not present

## 2015-09-14 DIAGNOSIS — F32A Depression, unspecified: Secondary | ICD-10-CM

## 2015-09-14 DIAGNOSIS — C4442 Squamous cell carcinoma of skin of scalp and neck: Secondary | ICD-10-CM

## 2015-09-14 DIAGNOSIS — C4441 Basal cell carcinoma of skin of scalp and neck: Secondary | ICD-10-CM | POA: Diagnosis not present

## 2015-09-14 DIAGNOSIS — F329 Major depressive disorder, single episode, unspecified: Secondary | ICD-10-CM | POA: Diagnosis not present

## 2015-09-14 MED ORDER — METHYLPHENIDATE HCL 20 MG PO TABS: ORAL_TABLET | ORAL | Status: DC

## 2015-09-14 NOTE — Progress Notes (Signed)
Subjective:    Patient ID: Margaret Nelson, female    DOB: [birth year], 64 y.o.   MRN: [id number]  HPI  This very nice 64 yo DWF with HTN presents for recheck and denies any c/o HA, dizziness, CP, palpitations, dyspnea or edema. She also has concerns about a "lump" on her R neck.   Medication Sig  . ALPRAZolam 1 MG tablet TAKE 1 TABLET 4 TIMES DAILY AS NEEDED.  Marland Kitchen Atorvastatin 80 MG tablet Take 80 mg by mouth daily.  . bumetanide  2 MG tablet take 1 tablet by mouth twice a day  . buPROPion- XL 300 MG  take 1 tablet by mouth every morning for MOOD  . Vit D  2000 UNITS  Take 1 tablet (2,000 Units total) by mouth 3 (three) times daily.  Marland Kitchen FLAX SEED OIL 1300 MG CAPS Take 1,300 mg by mouth 3 (three) times daily.  Marland Kitchen BREO ELLIPTA 200-25  Inhale 1 puff into the lungs daily.  Marland Kitchen HYDROcodone-acetaminophen (NORCO) 5-325 MG tablet Take 1 tablet by mouth every 6 (six) hours as needed for moderate pain.  Marland Kitchen levothyroxine  50 MCG tablet TAKE 1 TO 1 AND 1/2 TABLET BY MOUTH ONCE A DAY  . magnesium  500 MG tablet Take 500 mg by mouth daily.  . meloxicam  15 MG tablet Take 1 tablet (15 mg total) by mouth daily.  . Omega-3 FISH OIL 1000 MG  Take 1 capsule (1,000 mg total) by mouth 3 (three) times daily.  . pantoprazole  40 MG tablet Take 1 tablet (40 mg total) by mouth daily.  . potassium chloride  20 MEQ  take 1 tablet by mouth three times a day  . ranitidine300 MG tablet Take 1 to 2 tablets daily for heartburn & reflux to allow wean and transition from PPI / pantoprazole  . sertraline  100 MG tablet take 2 tablets by mouth once daily at bedtime  . methylphenidate  20 MG t Take 1/2 to 1 tablet 3 x day for Depression - Patient asked for an early refill as she will be in [facility] visiting with son over the holidays.   Allergies  Allergen Reactions  . Codeine Other (See Comments)    Abdominal pain  . Erythromycin Base     GI upset  . Penicillins Rash   Past Medical History  Diagnosis Date  . Hypertension   .  Hyperlipidemia   . Depression   . Thyroid disease     hypo  . Allergy   . Anxiety   . Arthritis   . Asthma     as a child, out grew   . Blood transfusion without reported diagnosis   . Cancer ([place])     basal cell left temple, left side of nose  . GERD (gastroesophageal reflux disease)    Past Surgical History  Procedure Laterality Date  . Cholecystectomy  1975  . Nasal sinus surgery  1996  . Liposuction  1998    with tummy tuck  . Breast surgery Bilateral 1983    implants  . Breast reconstruction  2008  . Appendectomy    . Bil breast lumpectomy    . Bilateral breast masectomy  no breast cancer    bil breast removed to prevent ca.  . Eus N/A 04/23/2014    Procedure: LOWER ENDOSCOPIC ULTRASOUND (EUS);  Surgeon: Milus Banister, MD;  Location: [facility];  Service: Endoscopy;  Laterality: N/A;   Review of Systems 10 point systems review negative  except as above.    Objective:   Physical Exam  BP 116/74 mmHg  Pulse 80  Temp(Src) 97.9 F (36.6 C)  Resp 16  Ht 5' 3.5" (1.613 m)  Wt 133 lb 6.4 oz (60.51 kg)  BMI 23.26 kg/m2  HEENT - Eac's patent. TM's Nl. EOM's full. PERRLA. NasoOroPharynx clear. Neck - supple. Nl Thyroid. Carotids 2+ & No bruits, nodes, JVD Chest - Clear equal BS w/o Rales, rhonchi, wheezes. Cor - Nl HS. RRR w/o sig MGR. PP 1(+). No edema. Abd - No palpable organomegaly, masses or tenderness. BS nl. MS- FROM w/o deformities. Muscle power, tone and bulk Nl. Gait Nl. Neuro - No obvious Cr N abnormalities. Sensory, motor and Cerebellar functions appear Nl w/o focal abnormalities. Psyche - Mental status normal & appropriate.  No delusions, ideations or obvious mood abnormalities.  Skin - there is a 5-6 x 8 mm firm shiny pink lesion with irregular margins on the R neck - posterior and inferior to the ear.   Procedure ( CPT: [phone] ) After informed consent and aseptic prep with alcohol and local anesthesia with Marcaine 0.5% w/epi , a diagonal 2 cm incision  was made by a # 10 scalpel in elliptical fashion to harvest the above lesion full thickness to the sub-cut  & with a 2-3 mm margin. Then skin edges were undermined and then approximated and everted and aligned with # 3 sutures of 3-0 Nylon with good cosmetic closure following the normal anatomic skin lines.Sterile Antibiotic ung was applied and covered with a sterile bandage . Patient was instructed in wound care and advised to return in 10 days for suture removal.  Lesion sent for Pathology.    Assessment & Plan:   1. Essential hypertension   2. 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

## 2015-09-21 ENCOUNTER — Telehealth: Payer: Self-pay | Admitting: *Deleted

## 2015-09-21 NOTE — Telephone Encounter (Signed)
Patient requested RX for Ritalin to patient in [place], [place].  RX was mailed and patient aware that a new RX can not be sent, if this one is lost in the mail, until the next refill is due.

## 2015-10-06 ENCOUNTER — Other Ambulatory Visit: Payer: Self-pay | Admitting: Internal Medicine

## 2015-10-06 DIAGNOSIS — J209 Acute bronchitis, unspecified: Secondary | ICD-10-CM

## 2015-10-06 MED ORDER — AZITHROMYCIN 250 MG PO TABS: ORAL_TABLET | ORAL | Status: DC

## 2015-10-06 MED ORDER — PROMETHAZINE-DM 6.25-15 MG/5ML PO SYRP
ORAL_SOLUTION | ORAL | Status: DC
Start: 2015-10-06 — End: 2016-03-13

## 2015-10-06 MED ORDER — PREDNISONE 20 MG PO TABS: ORAL_TABLET | ORAL | Status: DC

## 2015-10-10 DIAGNOSIS — J189 Pneumonia, unspecified organism: Secondary | ICD-10-CM | POA: Diagnosis not present

## 2015-10-10 DIAGNOSIS — R05 Cough: Secondary | ICD-10-CM | POA: Diagnosis not present

## 2015-11-04 ENCOUNTER — Other Ambulatory Visit: Payer: Self-pay | Admitting: Internal Medicine

## 2015-11-04 ENCOUNTER — Encounter: Payer: Self-pay | Admitting: Internal Medicine

## 2015-11-18 ENCOUNTER — Ambulatory Visit (INDEPENDENT_AMBULATORY_CARE_PROVIDER_SITE_OTHER): Payer: Medicare Other | Admitting: Internal Medicine

## 2015-11-18 ENCOUNTER — Encounter: Payer: Self-pay | Admitting: Internal Medicine

## 2015-11-18 VITALS — BP 96/62 | HR 80 | Temp 97.5°F | Resp 16 | Ht 63.5 in | Wt 127.6 lb

## 2015-11-18 DIAGNOSIS — F329 Major depressive disorder, single episode, unspecified: Secondary | ICD-10-CM | POA: Diagnosis not present

## 2015-11-18 DIAGNOSIS — C4441 Basal cell carcinoma of skin of scalp and neck: Secondary | ICD-10-CM | POA: Diagnosis not present

## 2015-11-18 DIAGNOSIS — F32A Depression, unspecified: Secondary | ICD-10-CM

## 2015-11-18 DIAGNOSIS — L089 Local infection of the skin and subcutaneous tissue, unspecified: Secondary | ICD-10-CM | POA: Diagnosis not present

## 2015-11-18 DIAGNOSIS — I10 Essential (primary) hypertension: Secondary | ICD-10-CM

## 2015-11-18 MED ORDER — METHYLPHENIDATE HCL 20 MG PO TABS: ORAL_TABLET | ORAL | Status: DC

## 2015-11-18 NOTE — Progress Notes (Signed)
Subjective:    Patient ID: Margaret Nelson, female    DOB: May 09, 1951, 65 y.o.   MRN: [id number]  HPI  This very nice 65 yo DWF with hx/o HTN and severe depression returns today for f/u and re-excision of a BCE of the R postero-lateral neck with a (+) involved margin on initial path specimen. HT systems review are neg. Rx is renewed for her Ritalin for her hx/o severe depression.   Medication Sig  . ALPRAZolam  1 MG tablet take 1 tablet by mouth four times a day  . atorvastatin  80 MG tablet Take 1/2 to 1 tablet daily  for Cholesterol  . bumetanide  2 MG tablet take 1 tablet by mouth twice a day  . buPROPion- XL 300 MG 24  take 1 tablet by mouth every morning for MOOD  . Vitamin D 2000 UNITS  Take 1 tablet (2,000 Units total) by mouth 3 (three) times daily.  Marland Kitchen FLAX SEED OIL 1300 MG CAPS Take 1,300 mg by mouth 3 (three) times daily.  Marland Kitchen BREO ELLIPTA 200-25 Inhale 1 puff into the lungs daily.  . NORCO 5-325 MG tablet Take 1 tablet by mouth every 6 (six) hours as needed for moderate pain.  Marland Kitchen levothyroxine  50 MCG tablet TAKE 1 TO 1 AND 1/2 TABLET BY MOUTH ONCE A DAY  . magnesium  500 MG tablet Take 500 mg by mouth daily.  . meloxicam (MOBIC) 15 MG tablet take 1 tablet by mouth once daily  . Omega-3 FISH OIL 1000 MG CAPS Take 1 capsule (1,000 mg total) by mouth 3 (three) times daily.  . pantoprazole  40 MG tablet Take 1 tablet (40 mg total) by mouth daily.  . potassium chloride20 MEQ tablet take 1 tablet by mouth three times a day  . ranitidine (ZANTAC) 300 MG tablet Take 1 to 2 tablets daily for heartburn & reflux to allow wean and transition from PPI / pantoprazole  . sertraline (ZOLOFT) 100 MG tablet take 2 tablets by mouth once daily at bedtime  . methylphenidate (RITALIN) 20 MG Take 1/2 to 1 tablet 3 x day as directed for depression - May fill after October 05, 2015   Allergies  Allergen Reactions  . Codeine Other (See Comments)    Abdominal pain  . Erythromycin Base     GI upset  .  Penicillins Rash   Past Medical History  Diagnosis Date  . Hypertension   . Hyperlipidemia   . Depression   . Thyroid disease     hypo  . Allergy   . Anxiety   . Arthritis   . Asthma     as a child, out grew   . Blood transfusion without reported diagnosis   . Cancer ([place])     basal cell left temple, left side of nose  . GERD (gastroesophageal reflux disease)    Review of Systems  10 point systems review negative except as above.    Objective:   Physical Exam  BP 96/62 mmHg  Pulse 80  Temp(Src) 97.5 F (36.4 C)  Resp 16  Ht 5' 3.5" (1.613 m)  Wt 127 lb 9.6 oz (57.879 kg)  BMI 22.25 kg/m2  HEENT - Eac's patent. TM's Nl. EOM's full. PERRLA. NasoOroPharynx clear. Neck - supple. Nl Thyroid. Carotids 2+ & No bruits, nodes, JVD Chest - Clear equal BS w/o Rales, rhonchi, wheezes. Cor - Nl HS. RRR w/o sig MGR. PP 1(+). No edema. Abd - No palpable organomegaly, masses or  tenderness. BS nl. MS- FROM w/o deformities. Muscle power, tone and bulk Nl. Gait Nl. Neuro - No obvious Cr N abnormalities. Sensory, motor and Cerebellar functions appear Nl w/o focal abnormalities. Psyche - Mental status normal & appropriate.  No delusions, ideations or obvious mood abnormalities.  Procedure: (CPT C413750) after informed consent and aseptic prep with alcohol the area of previous bx [id number]) was locally anesthetized with 1.o ml of Marcaine 0.5% w/epi. Then with a #10 scalpel an elliptical full thickness bx of the previous bx site was taken and delivered for path analysis. Then the wound was closed with #5 vertical mattress sutures of proline 3-0 . Then a 2" x 3" tegaderm sterile dressing was applied . Patient was instructed in wound care.     Assessment & Plan:   1. Essential hypertension   2. Basal cell carcinoma of right side of neck  - Procedure (CPT 11620)  - ROV 1 week for suture removal  - Dermatology pathology  3. Depression  - methylphenidate (RITALIN) 20 MG tablet; Take  1/2 to 1 tablet 3 x day as directed for depression - May fill after October 05, 2015  Dispense: 90 tablet; Refill: 0

## 2015-11-25 ENCOUNTER — Ambulatory Visit: Payer: Medicare Other | Admitting: Internal Medicine

## 2015-11-25 ENCOUNTER — Encounter: Payer: Self-pay | Admitting: Internal Medicine

## 2015-11-25 VITALS — BP 106/60 | HR 76 | Temp 97.9°F | Resp 16

## 2015-11-25 DIAGNOSIS — C4442 Squamous cell carcinoma of skin of scalp and neck: Secondary | ICD-10-CM

## 2015-11-25 NOTE — Progress Notes (Signed)
  Subjective:    Patient ID: Margaret Nelson, female    DOB: [birth year], 65 y.o.   MRN: [id number]  HPI   Patient returns for suture removal after re-excision of a SCC with (+) margin of the R neck and path found no residual tumor cells.  Review of Systems 10 point systems review negative except as above.    Objective:   Physical Exam  BP 106/60 mmHg  Pulse 76  Temp(Src) 97.9 F (36.6 C)  Resp 16  R neck wound well healed w/o signs of infection and good cosmesis. #5 sutures removed w/o difficulty.     Assessment & Plan:   1) s/p excision of a SCC of R neck.

## 2015-11-30 ENCOUNTER — Ambulatory Visit: Payer: Self-pay | Admitting: Internal Medicine

## 2015-12-01 ENCOUNTER — Ambulatory Visit (INDEPENDENT_AMBULATORY_CARE_PROVIDER_SITE_OTHER): Payer: Medicare Other | Admitting: Physician Assistant

## 2015-12-01 VITALS — BP 109/75 | HR 87 | Temp 98.2°F | Resp 18

## 2015-12-01 DIAGNOSIS — S0191XA Laceration without foreign body of unspecified part of head, initial encounter: Secondary | ICD-10-CM

## 2015-12-01 DIAGNOSIS — R51 Headache: Secondary | ICD-10-CM | POA: Diagnosis not present

## 2015-12-01 DIAGNOSIS — Z23 Encounter for immunization: Secondary | ICD-10-CM

## 2015-12-01 DIAGNOSIS — R519 Headache, unspecified: Secondary | ICD-10-CM

## 2015-12-01 MED ORDER — HYDROCODONE-ACETAMINOPHEN 5-325 MG PO TABS: 1.0000 | ORAL_TABLET | Freq: Four times a day (QID) | ORAL | Status: DC | PRN

## 2015-12-01 NOTE — Progress Notes (Signed)
12/01/2015 6:02 PM   DOB: [birth year] / MRN: [id number]  SUBJECTIVE:  Margaret Nelson is a 65 y.o. female presenting for a laceration to her face.  Reports she got tangled up with her dog today fell down 2 stairs and cut the skin above her left eye in the fall.  She denies LOC, nausea, emesis, vision changes, weakness at this time. She complains of right sided HA.    She is allergic to codeine; erythromycin base; and penicillins.   She  has a past medical history of Hypertension; Hyperlipidemia; Depression; Thyroid disease; Allergy; Anxiety; Arthritis; Asthma; Blood transfusion without reported diagnosis; Cancer ([place]); and GERD (gastroesophageal reflux disease).    She  reports that she has been smoking.  She has never used smokeless tobacco. She reports that she drinks alcohol. She reports that she does not use illicit drugs. She  has no sexual activity history on file. The patient  has past surgical history that includes Cholecystectomy (1975); Nasal sinus surgery (1996); Liposuction (1998); Breast surgery (Bilateral, 1983); Breast reconstruction (2008); Appendectomy; bil breast lumpectomy; bilateral breast masectomy (no breast cancer); and EUS (N/A, 04/23/2014).  Her family history includes COPD in her mother; Diabetes in her sister; Early death in her father and sister; Emphysema in her mother; Lung cancer in her mother. There is no history of Colon cancer, Esophageal cancer, Pancreatic cancer, Rectal cancer, or Stomach cancer.  Review of Systems  Constitutional: Negative for fever and chills.  Eyes: Negative for blurred vision.  Respiratory: Negative for cough and shortness of breath.   Cardiovascular: Negative for chest pain.  Gastrointestinal: Negative for nausea and abdominal pain.  Genitourinary: Negative for dysuria, urgency and frequency.  Musculoskeletal: Negative for myalgias.  Skin: Negative for rash.  Neurological: Negative for dizziness, tingling and headaches.    Psychiatric/Behavioral: Negative for depression. The patient is not nervous/anxious.     Problem list and medications reviewed and updated by myself where necessary, and exist elsewhere in the encounter.   OBJECTIVE:  BP 109/75 mmHg  Pulse 87  Temp(Src) 98.2 F (36.8 C) (Oral)  Resp 18  SpO2 98%  Physical Exam  Constitutional: She is oriented to person, place, and time.  HENT:  Head:    Neurological: She is alert and oriented to person, place, and time. She has normal strength. No cranial nerve deficit. She exhibits normal muscle tone. She displays a negative Romberg sign. Coordination normal. GCS eye subscore is 4. GCS verbal subscore is 5. GCS motor subscore is 6.  Memory and attention intact to challenge.   Vitals reviewed.   No results found for this or any previous visit (from the past 72 hour(s)).  No results found.  Risk and benefits discussed and verbal consent obtained. Anesthetic allergies reviewed. Patient anesthetized using 1:1 mix of 2% lidocaine with epi and Marcaine. The wound was cleansed thoroughly with sterile saline using a 22 gauge angiocath. Sterile prep and drape. Wound closed with 4 throws using 6-0 Ethilon suture material. Hemostasis achieved. Mupirocin applied to the wound and bandage placed. The patient tolerated well. Wound instructions were provided and the patient is to return in 6 days for suture removal.   [place] was seen today for head injury.  Diagnoses and all orders for this visit:  Laceration of head, initial encounter: Repair without difficulty. Her neurological exam is reassuring.     Facial pain -     HYDROcodone-acetaminophen (NORCO) 5-325 MG tablet; Take 1 tablet by mouth every 6 (  six) hours as needed for severe pain. For severe pain only.  Other orders -     Tdap vaccine greater than or equal to 7yo IM    The patient was advised to call or return to clinic if she does not see an improvement in symptoms or  to seek the care of the closest emergency department if she worsens with the above plan.   Philis Fendt, MHS, PA-C [facility] 12/01/2015 6:02 PM

## 2015-12-29 ENCOUNTER — Other Ambulatory Visit: Payer: Self-pay | Admitting: *Deleted

## 2015-12-29 MED ORDER — LEVOTHYROXINE SODIUM 50 MCG PO TABS: ORAL_TABLET | ORAL | Status: DC

## 2016-01-25 ENCOUNTER — Other Ambulatory Visit: Payer: Self-pay | Admitting: Internal Medicine

## 2016-01-31 ENCOUNTER — Other Ambulatory Visit: Payer: Self-pay | Admitting: Internal Medicine

## 2016-01-31 DIAGNOSIS — F32A Depression, unspecified: Secondary | ICD-10-CM

## 2016-01-31 DIAGNOSIS — F329 Major depressive disorder, single episode, unspecified: Secondary | ICD-10-CM

## 2016-01-31 MED ORDER — METHYLPHENIDATE HCL 20 MG PO TABS: ORAL_TABLET | ORAL | Status: DC

## 2016-02-09 ENCOUNTER — Ambulatory Visit: Payer: Self-pay | Admitting: Physician Assistant

## 2016-02-11 ENCOUNTER — Other Ambulatory Visit: Payer: Self-pay | Admitting: Internal Medicine

## 2016-02-15 ENCOUNTER — Encounter: Payer: Self-pay | Admitting: Internal Medicine

## 2016-02-29 ENCOUNTER — Ambulatory Visit: Payer: Self-pay | Admitting: Internal Medicine

## 2016-03-07 ENCOUNTER — Encounter: Payer: Self-pay | Admitting: Internal Medicine

## 2016-03-09 ENCOUNTER — Encounter: Payer: Self-pay | Admitting: Internal Medicine

## 2016-03-09 DIAGNOSIS — Z0000 Encounter for general adult medical examination without abnormal findings: Secondary | ICD-10-CM

## 2016-03-13 ENCOUNTER — Ambulatory Visit (INDEPENDENT_AMBULATORY_CARE_PROVIDER_SITE_OTHER): Payer: Medicare Other | Admitting: Internal Medicine

## 2016-03-13 ENCOUNTER — Encounter: Payer: Self-pay | Admitting: Internal Medicine

## 2016-03-13 VITALS — BP 104/62 | HR 92 | Temp 97.3°F | Resp 16 | Ht 63.75 in | Wt 131.6 lb

## 2016-03-13 DIAGNOSIS — E079 Disorder of thyroid, unspecified: Secondary | ICD-10-CM | POA: Diagnosis not present

## 2016-03-13 DIAGNOSIS — I10 Essential (primary) hypertension: Secondary | ICD-10-CM

## 2016-03-13 DIAGNOSIS — K219 Gastro-esophageal reflux disease without esophagitis: Secondary | ICD-10-CM

## 2016-03-13 DIAGNOSIS — E782 Mixed hyperlipidemia: Secondary | ICD-10-CM | POA: Diagnosis not present

## 2016-03-13 DIAGNOSIS — F329 Major depressive disorder, single episode, unspecified: Secondary | ICD-10-CM | POA: Diagnosis not present

## 2016-03-13 DIAGNOSIS — Z1212 Encounter for screening for malignant neoplasm of rectum: Secondary | ICD-10-CM

## 2016-03-13 DIAGNOSIS — Z79899 Other long term (current) drug therapy: Secondary | ICD-10-CM | POA: Diagnosis not present

## 2016-03-13 DIAGNOSIS — R7303 Prediabetes: Secondary | ICD-10-CM | POA: Diagnosis not present

## 2016-03-13 DIAGNOSIS — E559 Vitamin D deficiency, unspecified: Secondary | ICD-10-CM | POA: Diagnosis not present

## 2016-03-13 DIAGNOSIS — Z136 Encounter for screening for cardiovascular disorders: Secondary | ICD-10-CM | POA: Diagnosis not present

## 2016-03-13 DIAGNOSIS — R7309 Other abnormal glucose: Secondary | ICD-10-CM

## 2016-03-13 DIAGNOSIS — F32A Depression, unspecified: Secondary | ICD-10-CM

## 2016-03-13 LAB — BASIC METABOLIC PANEL WITH GFR
BUN: 16 mg/dL (ref 7–25)
CO2: 32 mmol/L — ABNORMAL HIGH (ref 20–31)
Calcium: 9.2 mg/dL (ref 8.6–10.4)
Chloride: 103 mmol/L (ref 98–110)
Creat: 1.11 mg/dL — ABNORMAL HIGH (ref 0.50–0.99)
GFR, Est African American: 61 mL/min (ref >=60)
GFR, Est Non African American: 53 mL/min — ABNORMAL LOW (ref >=60)
Glucose, Bld: 99 mg/dL (ref 65–99)
Potassium: 3.5 mmol/L (ref 3.5–5.3)
Sodium: 143 mmol/L (ref 135–146)

## 2016-03-13 LAB — CBC WITH DIFFERENTIAL/PLATELET
Basophils Absolute: 87 cells/uL (ref 0–200)
Basophils Relative: 1 %
Eosinophils Absolute: 435 cells/uL (ref 15–500)
Eosinophils Relative: 5 %
HCT: 39.1 % (ref 35.0–45.0)
Hemoglobin: 12.9 g/dL (ref 11.7–15.5)
Lymphocytes Relative: 36 %
Lymphs Abs: 3132 cells/uL (ref 850–3900)
MCH: 30.4 pg (ref 27.0–33.0)
MCHC: 33 g/dL (ref 32.0–36.0)
MCV: 92.2 fL (ref 80.0–100.0)
MPV: 10.4 fL (ref 7.5–12.5)
Monocytes Absolute: 522 cells/uL (ref 200–950)
Monocytes Relative: 6 %
Neutro Abs: 4524 cells/uL (ref 1500–7800)
Neutrophils Relative %: 52 %
Platelets: 270 10*3/uL (ref 140–400)
RBC: 4.24 MIL/uL (ref 3.80–5.10)
RDW: 13.7 % (ref 11.0–15.0)
WBC: 8.7 10*3/uL (ref 3.8–10.8)

## 2016-03-13 LAB — HEMOGLOBIN A1C
Hgb A1c MFr Bld: 5.8 % — ABNORMAL HIGH (ref <5.7)
Mean Plasma Glucose: 120 mg/dL

## 2016-03-13 LAB — LIPID PANEL
Cholesterol: 184 mg/dL (ref 125–200)
HDL: 68 mg/dL (ref >=46)
LDL Cholesterol: 79 mg/dL (ref <130)
Total CHOL/HDL Ratio: 2.7 Ratio (ref <=5.0)
Triglycerides: 184 mg/dL — ABNORMAL HIGH (ref <150)
VLDL: 37 mg/dL — ABNORMAL HIGH (ref <30)

## 2016-03-13 LAB — HEPATIC FUNCTION PANEL
ALT: 15 U/L (ref 6–29)
AST: 17 U/L (ref 10–35)
Albumin: 4.1 g/dL (ref 3.6–5.1)
Alkaline Phosphatase: 77 U/L (ref 33–130)
Bilirubin, Direct: 0.1 mg/dL (ref <=0.2)
Indirect Bilirubin: 0.4 mg/dL (ref 0.2–1.2)
Total Bilirubin: 0.5 mg/dL (ref 0.2–1.2)
Total Protein: 6.4 g/dL (ref 6.1–8.1)

## 2016-03-13 LAB — MAGNESIUM: Magnesium: 2.1 mg/dL (ref 1.5–2.5)

## 2016-03-13 LAB — TSH: TSH: 1.05 mIU/L

## 2016-03-13 NOTE — Progress Notes (Signed)
Patient ID: Margaret Nelson, female   DOB: 01-09-1951, 65 y.o.   MRN: [id number]  [facility]                   Unk Pinto, [name]    [name], [name]      [name], [name]   [facility]                [address] [place], [place] [id number] Telephone [phone] Telefax [phone]  ______________________________________________________________________  Comprehensive Evaluation &  Examination     This very nice 65 y.o. DWF presents for a comprehensive evaluation and management of multiple medical co-morbidities.  Patient has been followed for HTN, Prediabetes, Hyperlipidemia, Hypothyroidism and Vitamin D Deficiency.Patient also has long hx/o severe Depression and had undergone ECT x 9 in 2008 and has been on SS Disability since that time . She had ben followed by [facility] and as she's stabilized over the last few years, she follows here for her med management. Patient has stabilized on current meds including methylphenidate.  She also has hx/o GERD controlled with diet and current meds, altho  she does admit occasional pc bloating and reflux type waterbrash.       HTN predates circa 2008. Patient's BP has been controlled at home and patient denies any cardiac symptoms as chest pain, palpitations, shortness of breath, dizziness or ankle swelling. Today's BP: 104/62 mmHg      Patient's hyperlipidemia is controlled with diet and medications. Patient denies myalgias or other medication SE's. Last lipids were at goal with Cholesterol 184; HDL 68; LDL 79; but elevated Triglycerides 184 on 03/13/2016.     Patient has prediabetes with A1c 5.7% predating since 2012 and patient denies reactive hypoglycemic symptoms, visual blurring, diabetic polys, or paresthesias. Last A1c was 5.8% on 03/13/2016.     Patient has been on thyroid replacement since 2010 and dosing has been stable. Finally,  patient has history of Vitamin D Deficiency of "71" in 2008 and last Vitamin D was 48 on 08/30/2015.    Medication Sig  . ALPRAZolam  1 MG tablet take 1 tablet by mouth four times a day  . atorvastatin  80 MG tablet Take 1/2 to 1 tablet daily  for Cholesterol  . bumetanide  2 MG tablet take 1 tablet by mouth twice a day  . buPROPion- XL 300 MG t take 1 tablet by mouth every morning for MOOD  . cholecalciferol 2000 UNITS  Take 1 tablet (2,000 Units total) by mouth 3 (three) times daily.  Marland Kitchen FLAX SEED OIL 1300 MG Take 1,300 mg by mouth 3 (three) times daily.  Marland Kitchen BREO ELLIPTA 200-25 Inhale 1 puff into the lungs daily.  Marland Kitchen levothyroxine  50 MCG  TAKE 1 TO 1 AND 1/2 TABLET BY MOUTH ONCE A DAY  . magnesium  500 MG  Take 500 mg by mouth daily.  . meloxicam  15 MG  take 1 tablet by mouth once daily  . methylphenidate 20 MG  Take 1/2 to 1 tablet 3 x day as directed for depression.  Do not take more than 5 days per week  . Omega-3 FISH OIL 1000 MG  Take 1 capsule (1,000 mg total) by mouth 3 (three) times daily.  . pantoprazole  40 MG  Take 1 tablet (40 mg total) by mouth daily.  Marland Kitchen  potassium chloride  20 MEQ  take 1 tablet by mouth three times a day  . sertraline (ZOLOFT) 100 MG take 2 tablets by mouth once daily at bedtime   Allergies  Allergen Reactions  . Codeine Other (See Comments)    Abdominal pain  . Erythromycin Base     GI upset  . Penicillins Rash   Past Medical History  Diagnosis Date  . Hypertension   . Hyperlipidemia   . Depression   . Thyroid disease     hypo  . Allergy   . Anxiety   . Arthritis   . Asthma     as a child, out grew   . Blood transfusion without reported diagnosis   . Cancer ([place])     basal cell left temple, left side of nose  . GERD (gastroesophageal reflux disease)    Health Maintenance  Topic Date Due  . Hepatitis C Screening  03/20/1951  . HIV Screening  12/12/1967  . MAMMOGRAM  03/31/2012  . ZOSTAVAX  12/11/2012  . INFLUENZA VACCINE  04/25/2016  .  COLONOSCOPY  04/13/2017  . PAP SMEAR  01/26/2018  . TETANUS/TDAP  11/30/2025   Immunization History  Administered Date(s) Administered  . Influenza Split 06/22/2015  . Pneumococcal-Unspecified 09/25/2004  . Td 09/25/2004  . Tdap 12/01/2015   Past Surgical History  Procedure Laterality Date  . Cholecystectomy  1975  . Nasal sinus surgery  1996  . Liposuction  1998    with tummy tuck  . Breast surgery Bilateral 1983    implants  . Breast reconstruction  2008  . Appendectomy    . Bil breast lumpectomy    . Bilateral breast masectomy  no breast cancer    bil breast removed to prevent ca.  . Eus N/A 04/23/2014    Procedure: LOWER ENDOSCOPIC ULTRASOUND (EUS);  Surgeon: Milus Banister, MD;  Location: [facility];  Service: Endoscopy;  Laterality: N/A;   Family History  Problem Relation Age of Onset  . COPD Mother   . Lung cancer Mother   . Emphysema Mother   . Early death Father     murdered at 55  . Early death Sister     suicide at 56  . Colon cancer Neg Hx   . Esophageal cancer Neg Hx   . Pancreatic cancer Neg Hx   . Rectal cancer Neg Hx   . Stomach cancer Neg Hx   . Diabetes Sister    Social History  Substance Use Topics  . Smoking status: Current Every Day Smoker -- 0.50 packs/day  . Smokeless tobacco: Never Used  . Alcohol Use: Yes     Comment: occasional    ROS Constitutional: Denies fever, chills, weight loss/gain, headaches, insomnia,  night sweats, and change in appetite. Does c/o fatigue. Eyes: Denies redness, blurred vision, diplopia, discharge, itchy, watery eyes.  ENT: Denies discharge, congestion, post nasal drip, epistaxis, sore throat, earache, hearing loss, dental pain, Tinnitus, Vertigo, Sinus pain, snoring.  Cardio: Denies chest pain, palpitations, irregular heartbeat, syncope, dyspnea, diaphoresis, orthopnea, PND, claudication, edema Respiratory: denies cough, dyspnea, DOE, pleurisy, hoarseness, laryngitis, wheezing.  Gastrointestinal: Denies  dysphagia, heartburn, reflux, water brash, pain, cramps, nausea, vomiting, bloating, diarrhea, constipation, hematemesis, melena, hematochezia, jaundice, hemorrhoids Genitourinary: Denies dysuria, frequency, urgency, nocturia, hesitancy, discharge, hematuria, flank pain Breast: Breast lumps, nipple discharge, bleeding.  Musculoskeletal: Denies arthralgia, myalgia, stiffness, Jt. Swelling, pain, limp, and strain/sprain. Denies falls. Skin: Denies puritis, rash, hives, warts, acne, eczema, changing in skin lesion Neuro:  No weakness, tremor, incoordination, spasms, paresthesia, pain Psychiatric: Denies confusion, memory loss, sensory loss. Denies Depression. Endocrine: Denies change in weight, skin, hair change, nocturia, and paresthesia, diabetic polys, visual blurring, hyper / hypo glycemic episodes.  Heme/Lymph: No excessive bleeding, bruising, enlarged lymph nodes.  Physical Exam  BP 104/62 mmHg  Pulse 92  Temp(Src) 97.3 F (36.3 C)  Resp 16  Ht 5' 3.75" (1.619 m)  Wt 131 lb 9.6 oz (59.693 kg)  BMI 22.77 kg/m2  General Appearance: Well nourished and in no apparent distress. Eyes: PERRLA, EOMs, conjunctiva no swelling or erythema, normal fundi and vessels. Sinuses: No frontal/maxillary tenderness ENT/Mouth: EACs patent / TMs  nl. Nares clear without erythema, swelling, mucoid exudates. Oral hygiene is good. No erythema, swelling, or exudate. Tongue normal, non-obstructing. Tonsils not swollen or erythematous. Hearing normal.  Neck: Supple, thyroid normal. No bruits, nodes or JVD. Respiratory: Respiratory effort normal.  BS equal and clear bilateral without rales, rhonci, wheezing or stridor. Cardio: Heart sounds are normal with regular rate and rhythm and no murmurs, rubs or gallops. Peripheral pulses are normal and equal bilaterally without edema. No aortic or femoral bruits. Chest: symmetric with normal excursions and percussion. Breasts: Symmetric, without lumps, nipple discharge,  retractions, or fibrocystic changes.  Abdomen: Flat, soft with bowel sounds active. Nontender, no guarding, rebound, hernias, masses, or organomegaly.  Lymphatics: Non tender without lymphadenopathy.  Genitourinary:  Musculoskeletal: Full ROM all peripheral extremities, joint stability, 5/5 strength, and normal gait. Skin: Warm and dry without rashes, lesions, cyanosis, clubbing or  ecchymosis.  Neuro: Cranial nerves intact, reflexes equal bilaterally. Normal muscle tone, no cerebellar symptoms. Sensation intact.  Pysch: Alert and oriented X 3, normal affect, Insight and Judgment appropriate.   Assessment and Plan   1. Essential hypertension  - Microalbumin / creatinine urine ratio - EKG 12-Lead - [place], RETROPERITNL ABD,  LTD - TSH  2. Hyperlipidemia  - Lipid panel - TSH  3. Prediabetes  - Hemoglobin A1c - Insulin, random  4. Vitamin D deficiency  - VITAMIN D 25 Hydroxy   5. Hypothyroidism  - TSH  6. Gastroesophageal reflux disease   7. Screening for rectal cancer  - POC Hemoccult Bld/Stl   8. Depression, controlled   9. Medication management  - CBC with Differential/Platelet - BASIC METABOLIC PANEL WITH GFR - Hepatic function panel - Magnesium  10. Other abnormal glucose  - Hemoglobin A1c - Insulin, random     Continue prudent diet as discussed, weight control, BP monitoring, regular exercise, and medications. Discussed med's effects and SE's. Screening labs and tests as requested with regular follow-up as recommended. Over 40 minutes of exam, counseling, chart review and high complex critical decision making was performed.

## 2016-03-13 NOTE — Patient Instructions (Signed)
Recommend Adult Low Dose Aspirin or   coated  Aspirin 81 mg daily   To reduce risk of Colon Cancer 20 %,   Skin Cancer 26 % ,   Melanoma 46%   and   Pancreatic cancer 60%   ++++++++++++++++++++++++++++++++++++++++++++++++++++++ Vitamin D goal   is between 70-100.   Please make sure that you are taking your Vitamin D as directed.   It is very important as a natural anti-inflammatory   helping hair, skin, and nails, as well as reducing stroke and heart attack risk.   It helps your bones and helps with mood.  It also decreases numerous cancer risks so please take it as directed.   Low Vit D is associated with a 200-300% higher risk for CANCER   and 200-300% higher risk for HEART   ATTACK  &  STROKE.   .....................................Marland Kitchen  It is also associated with higher death rate at younger ages,   autoimmune diseases like Rheumatoid arthritis, Lupus, Multiple Sclerosis.     Also many other serious conditions, like depression, Alzheimer's  Dementia, infertility, muscle aches, fatigue, fibromyalgia - just to name a few.  ++++++++++++++++++++++++++++++++++++++++++++++++  Recommend the book "The END of DIETING" by Dr Excell Seltzer   & the book "The END of DIABETES " by Dr Excell Seltzer  At [email or web address] - get book & Audio CD's     Being diabetic has a  300% increased risk for heart attack, stroke, cancer, and alzheimer- type vascular dementia. It is very important that you work harder with diet by avoiding all foods that are white. Avoid white rice (brown & wild rice is OK), white potatoes (sweetpotatoes in moderation is OK), White bread or wheat bread or anything made out of white flour like bagels, donuts, rolls, buns, biscuits, cakes, pastries, cookies, pizza crust, and pasta (made from white flour & egg whites) - vegetarian pasta or spinach or wheat pasta is OK. Multigrain breads like Arnold's or Pepperidge [employer], or multigrain sandwich thins or flatbreads.  Diet,  exercise and weight loss can reverse and cure diabetes in the early stages.  Diet, exercise and weight loss is very important in the control and prevention of complications of diabetes which affects every system in your body, ie. Brain - dementia/stroke, eyes - glaucoma/blindness, heart - heart attack/heart failure, kidneys - dialysis, stomach - gastric paralysis, intestines - malabsorption, nerves - severe painful neuritis, circulation - gangrene & loss of a leg(s), and finally cancer and Alzheimers.    I recommend avoid fried & greasy foods,  sweets/candy, white rice (brown or wild rice or Quinoa is OK), white potatoes (sweet potatoes are OK) - anything made from white flour - bagels, doughnuts, rolls, buns, biscuits,white and wheat breads, pizza crust and traditional pasta made of white flour & egg white(vegetarian pasta or spinach or wheat pasta is OK).  Multi-grain bread is OK - like multi-grain flat bread or sandwich thins. Avoid alcohol in excess. Exercise is also important.    Eat all the vegetables you want - avoid meat, especially red meat and dairy - especially cheese.  Cheese is the most concentrated form of trans-fats which is the worst thing to clog up our arteries. Veggie cheese is OK which can be found in the fresh produce section at [employer] or [employer] or [employer]  ++++++++++++++++++++++++++++++++++++++++++++++++++ DASH Eating Plan  DASH stands for "Dietary Approaches to Stop Hypertension."   The DASH eating plan is a healthy eating plan that has been shown to reduce high blood  pressure (hypertension). Additional health benefits may include reducing the risk of type 2 diabetes mellitus, heart disease, and stroke. The DASH eating plan may also help with weight loss.  WHAT DO I NEED TO KNOW ABOUT THE DASH EATING PLAN?  For the DASH eating plan, you will follow these general guidelines:  Choose foods with a percent daily value for sodium of less than 5% (as listed on the food  label).  Use salt-free seasonings or herbs instead of table salt or sea salt.  Check with your health care provider or pharmacist before using salt substitutes.  Eat lower-sodium products, often labeled as "lower sodium" or "no salt added."  Eat fresh foods.  Eat more vegetables, fruits, and low-fat dairy products.    Choose whole grains. Look for the word "whole" as the first word in the ingredient list.  Choose fish   Limit sweets, desserts, sugars, and sugary drinks.  Choose heart-healthy fats.  Eat veggie cheese   Eat more home-cooked food and less restaurant, buffet, and fast food.  Limit fried foods.  Huffaker foods using methods other than frying.  Limit canned vegetables. If you do use them, rinse them well to decrease the sodium.  When eating at a restaurant, ask that your food be prepared with less salt, or no salt if possible.                      WHAT FOODS CAN I EAT?  Read Dr Fara Olden Fuhrman's books on The End of Dieting & The End of Diabetes  Grains  Whole grain or whole wheat bread. Brown rice. Whole grain or whole wheat pasta. Quinoa, bulgur, and whole grain cereals. Low-sodium cereals. Corn or whole wheat flour tortillas. Whole grain cornbread. Whole grain crackers. Low-sodium crackers.  Vegetables  Fresh or frozen vegetables (raw, steamed, roasted, or grilled). Low-sodium or reduced-sodium tomato and vegetable juices. Low-sodium or reduced-sodium tomato sauce and paste. Low-sodium or reduced-sodium canned vegetables.   Fruits  All fresh, canned (in natural juice), or frozen fruits.  Protein Products   All fish and seafood.  Dried beans, peas, or lentils. Unsalted nuts and seeds. Unsalted canned beans.  Dairy  Low-fat dairy products, such as skim or 1% milk, 2% or reduced-fat cheeses, low-fat ricotta or cottage cheese, or plain low-fat yogurt. Low-sodium or reduced-sodium cheeses.  Fats and Oils  Tub margarines without trans fats. Light or  reduced-fat mayonnaise and salad dressings (reduced sodium). Avocado. Safflower, olive, or canola oils. Natural peanut or almond butter.  Other  Unsalted popcorn and pretzels. The items listed above may not be a complete list of recommended foods or beverages. Contact your dietitian for more options.  +++++++++++++++++++++++++++++++++++++++++++  WHAT FOODS ARE NOT RECOMMENDED?  Grains/ White flour or wheat flour  White bread. White pasta. White rice. Refined cornbread. Bagels and croissants. Crackers that contain trans fat.  Vegetables  Creamed or fried vegetables. Vegetables in a . Regular canned vegetables. Regular canned tomato sauce and paste. Regular tomato and vegetable juices.  Fruits  Dried fruits. Canned fruit in light or heavy syrup. Fruit juice.  Meat and Other Protein Products  Meat in general - RED mwaet & White meat.  Fatty cuts of meat. Ribs, chicken wings, bacon, sausage, bologna, salami, chitterlings, fatback, hot dogs, bratwurst, and packaged luncheon meats.  Dairy  Whole or 2% milk, cream, half-and-half, and cream cheese. Whole-fat or sweetened yogurt. Full-fat cheeses or blue cheese. Nondairy creamers and whipped toppings. Processed cheese, cheese spreads, or  cheese curds.  Condiments  Onion and garlic salt, seasoned salt, table salt, and sea salt. Canned and packaged gravies. Worcestershire sauce. Tartar sauce. Barbecue sauce. Teriyaki sauce. Soy sauce, including reduced sodium. Steak sauce. Fish sauce. Oyster sauce. Cocktail sauce. Horseradish. Ketchup and mustard. Meat flavorings and tenderizers. Bouillon cubes. Hot sauce. Tabasco sauce. Marinades. Taco seasonings. Relishes.  Fats and Oils Butter, stick margarine, lard, shortening and bacon fat. Coconut, palm kernel, or palm oils. Regular salad dressings.  Pickles and olives. Salted popcorn and pretzels.  The items listed above may not be a complete list of foods and beverages to avoid.   Preventive  Care for Adults  A healthy lifestyle and preventive care can promote health and wellness. Preventive health guidelines for women include the following key practices.  A routine yearly physical is a good way to check with your health care provider about your health and preventive screening. It is a chance to share any concerns and updates on your health and to receive a thorough exam.  Visit your dentist for a routine exam and preventive care every 6 months. Brush your teeth twice a day and floss once a day. Good oral hygiene prevents tooth decay and gum disease.  The frequency of eye exams is based on your age, health, family medical history, use of contact lenses, and other factors. Follow your health care provider's recommendations for frequency of eye exams.  Eat a healthy diet. Foods like vegetables, fruits, whole grains, low-fat dairy products, and lean protein foods contain the nutrients you need without too many calories. Decrease your intake of foods high in solid fats, added sugars, and salt. Eat the right amount of calories for you.Get information about a proper diet from your health care provider, if necessary.  Regular physical exercise is one of the most important things you can do for your health. Most adults should get at least 150 minutes of moderate-intensity exercise (any activity that increases your heart rate and causes you to sweat) each week. In addition, most adults need muscle-strengthening exercises on 2 or more days a week.  Maintain a healthy weight. The body mass index (BMI) is a screening tool to identify possible weight problems. It provides an estimate of body fat based on height and weight. Your health care provider can find your BMI and can help you achieve or maintain a healthy weight.For adults 20 years and older:  A BMI below 18.5 is considered underweight.  A BMI of 18.5 to 24.9 is normal.  A BMI of 25 to 29.9 is considered overweight.  A BMI of 30 and  above is considered obese.  Maintain normal blood lipids and cholesterol levels by exercising and minimizing your intake of saturated fat. Eat a balanced diet with plenty of fruit and vegetables. If your lipid or cholesterol levels are high, you are over 50, or you are at high risk for heart disease, you may need your cholesterol levels checked more frequently.Ongoing high lipid and cholesterol levels should be treated with medicines if diet and exercise are not working.  If you smoke, find out from your health care provider how to quit. If you do not use tobacco, do not start.  Lung cancer screening is recommended for adults aged 56-80 years who are at high risk for developing lung cancer because of a history of smoking. A yearly low-dose CT scan of the lungs is recommended for people who have at least a 30-pack-year history of smoking and are a current smoker or  have quit within the past 15 years. A pack year of smoking is smoking an average of 1 pack of cigarettes a day for 1 year (for example: 1 pack a day for 30 years or 2 packs a day for 15 years). Yearly screening should continue until the smoker has stopped smoking for at least 15 years. Yearly screening should be stopped for people who develop a health problem that would prevent them from having lung cancer treatment.  Avoid use of street drugs. Do not share needles with anyone. Ask for help if you need support or instructions about stopping the use of drugs.  High blood pressure causes heart disease and increases the risk of stroke.  Ongoing high blood pressure should be treated with medicines if weight loss and exercise do not work.  If you are 52-67 years old, ask your health care provider if you should take aspirin to prevent strokes.  Diabetes screening involves taking a blood sample to check your fasting blood sugar level. This should be done once every 3 years, after age 9, if you are within normal weight and without risk factors for  diabetes. Testing should be considered at a younger age or be carried out more frequently if you are overweight and have at least 1 risk factor for diabetes.  Breast cancer screening is essential preventive care for women. You should practice "breast self-awareness." This means understanding the normal appearance and feel of your breasts and may include breast self-examination. Any changes detected, no matter how small, should be reported to a health care provider. Women in their 45s and 30s should have a clinical breast exam (CBE) by a health care provider as part of a regular health exam every 1 to 3 years. After age 76, women should have a CBE every year. Starting at age 54, women should consider having a mammogram (breast X-ray test) every year. Women who have a family history of breast cancer should talk to their health care provider about genetic screening. Women at a high risk of breast cancer should talk to their health care providers about having an MRI and a mammogram every year.  Breast cancer gene (BRCA)-related cancer risk assessment is recommended for women who have family members with BRCA-related cancers. BRCA-related cancers include breast, ovarian, tubal, and peritoneal cancers. Having family members with these cancers may be associated with an increased risk for harmful changes (mutations) in the breast cancer genes BRCA1 and BRCA2. Results of the assessment will determine the need for genetic counseling and BRCA1 and BRCA2 testing.  Routine pelvic exams to screen for cancer are no longer recommended for nonpregnant women who are considered low risk for cancer of the pelvic organs (ovaries, uterus, and vagina) and who do not have symptoms. Ask your health care provider if a screening pelvic exam is right for you.  If you have had past treatment for cervical cancer or a condition that could lead to cancer, you need Pap tests and screening for cancer for at least 20 years after your  treatment. If Pap tests have been discontinued, your risk factors (such as having a new sexual partner) need to be reassessed to determine if screening should be resumed. Some women have medical problems that increase the chance of getting cervical cancer. In these cases, your health care provider may recommend more frequent screening and Pap tests.    Colorectal cancer can be detected and often prevented. Most routine colorectal cancer screening begins at the age of 73 years and  continues through age 75 years. However, your health care provider may recommend screening at an earlier age if you have risk factors for colon cancer. On a yearly basis, your health care provider may provide home test kits to check for hidden blood in the stool. Use of a small camera at the end of a tube, to directly examine the colon (sigmoidoscopy or colonoscopy), can detect the earliest forms of colorectal cancer. Talk to your health care provider about this at age 50, when routine screening begins. Direct exam of the colon should be repeated every 5-10 years through age 75 years, unless early forms of pre-cancerous polyps or small growths are found.  Osteoporosis is a disease in which the bones lose minerals and strength with aging. This can result in serious bone fractures or breaks. The risk of osteoporosis can be identified using a bone density scan. Women ages 65 years and over and women at risk for fractures or osteoporosis should discuss screening with their health care providers. Ask your health care provider whether you should take a calcium supplement or vitamin D to reduce the rate of osteoporosis.  Menopause can be associated with physical symptoms and risks. Hormone replacement therapy is available to decrease symptoms and risks. You should talk to your health care provider about whether hormone replacement therapy is right for you.  Use sunscreen. Apply sunscreen liberally and repeatedly throughout the day. You  should seek shade when your shadow is shorter than you. Protect yourself by wearing long sleeves, pants, a wide-brimmed hat, and sunglasses year round, whenever you are outdoors.  Once a month, do a whole body skin exam, using a mirror to look at the skin on your back. Tell your health care provider of new moles, moles that have irregular borders, moles that are larger than a pencil eraser, or moles that have changed in shape or color.  Stay current with required vaccines (immunizations).  Influenza vaccine. All adults should be immunized every year.  Tetanus, diphtheria, and acellular pertussis (Td, Tdap) vaccine. Pregnant women should receive 1 dose of Tdap vaccine during each pregnancy. The dose should be obtained regardless of the length of time since the last dose. Immunization is preferred during the 27th-36th week of gestation. An adult who has not previously received Tdap or who does not know her vaccine status should receive 1 dose of Tdap. This initial dose should be followed by tetanus and diphtheria toxoids (Td) booster doses every 10 years. Adults with an unknown or incomplete history of completing a 3-dose immunization series with Td-containing vaccines should begin or complete a primary immunization series including a Tdap dose. Adults should receive a Td booster every 10 years.    Zoster vaccine. One dose is recommended for adults aged 60 years or older unless certain conditions are present.    Pneumococcal 13-valent conjugate (PCV13) vaccine. When indicated, a person who is uncertain of her immunization history and has no record of immunization should receive the PCV13 vaccine. An adult aged 19 years or older who has certain medical conditions and has not been previously immunized should receive 1 dose of PCV13 vaccine. This PCV13 should be followed with a dose of pneumococcal polysaccharide (PPSV23) vaccine. The PPSV23 vaccine dose should be obtained at least 8 weeks after the dose  of PCV13 vaccine. An adult aged 19 years or older who has certain medical conditions and previously received 1 or more doses of PPSV23 vaccine should receive 1 dose of PCV13. The PCV13 vaccine dose should   be obtained 1 or more years after the last PPSV23 vaccine dose.    Pneumococcal polysaccharide (PPSV23) vaccine. When PCV13 is also indicated, PCV13 should be obtained first. All adults aged 65 years and older should be immunized. An adult younger than age 65 years who has certain medical conditions should be immunized. Any person who resides in a nursing home or long-term care facility should be immunized. An adult smoker should be immunized. People with an immunocompromised condition and certain other conditions should receive both PCV13 and PPSV23 vaccines. People with human immunodeficiency virus (HIV) infection should be immunized as soon as possible after diagnosis. Immunization during chemotherapy or radiation therapy should be avoided. Routine use of PPSV23 vaccine is not recommended for American Indians, [place] Natives, or people younger than 65 years unless there are medical conditions that require PPSV23 vaccine. When indicated, people who have unknown immunization and have no record of immunization should receive PPSV23 vaccine. One-time revaccination 5 years after the first dose of PPSV23 is recommended for people aged 19-64 years who have chronic kidney failure, nephrotic syndrome, asplenia, or immunocompromised conditions. People who received 1-2 doses of PPSV23 before age 65 years should receive another dose of PPSV23 vaccine at age 65 years or later if at least 5 years have passed since the previous dose. Doses of PPSV23 are not needed for people immunized with PPSV23 at or after age 65 years.   Preventive Services / Frequency  Ages 65 years and over  Blood pressure check.  Lipid and cholesterol check.  Lung cancer screening. / Every year if you are aged 55-80 years and have a  30-pack-year history of smoking and currently smoke or have quit within the past 15 years. Yearly screening is stopped once you have quit smoking for at least 15 years or develop a health problem that would prevent you from having lung cancer treatment.  Clinical breast exam.** / Every year after age 40 years.  BRCA-related cancer risk assessment.** / For women who have family members with a BRCA-related cancer (breast, ovarian, tubal, or peritoneal cancers).  Mammogram.** / Every year beginning at age 40 years and continuing for as long as you are in good health. Consult with your health care provider.  Pap test.** / Every 3 years starting at age 30 years through age 65 or 70 years with 3 consecutive normal Pap tests. Testing can be stopped between 65 and 70 years with 3 consecutive normal Pap tests and no abnormal Pap or HPV tests in the past 10 years.  Fecal occult blood test (FOBT) of stool. / Every year beginning at age 50 years and continuing until age 75 years. You may not need to do this test if you get a colonoscopy every 10 years.  Flexible sigmoidoscopy or colonoscopy.** / Every 5 years for a flexible sigmoidoscopy or every 10 years for a colonoscopy beginning at age 50 years and continuing until age 75 years.  Hepatitis C blood test.** / For all people born from 1945 through 1965 and any individual with known risks for hepatitis C.  Osteoporosis screening.** / A one-time screening for women ages 65 years and over and women at risk for fractures or osteoporosis.  Skin self-exam. / Monthly.  Influenza vaccine. / Every year.  Tetanus, diphtheria, and acellular pertussis (Tdap/Td) vaccine.** / 1 dose of Td every 10 years.  Zoster vaccine.** / 1 dose for adults aged 60 years or older.  Pneumococcal 13-valent conjugate (PCV13) vaccine.** / Consult your health care provider.    Pneumococcal polysaccharide (PPSV23) vaccine.** / 1 dose for all adults aged 65 years and older. Screening  for abdominal aortic aneurysm (AAA)  by ultrasound is recommended for people who have history of high blood pressure or who are current or former smokers. 

## 2016-03-14 ENCOUNTER — Telehealth: Payer: Self-pay | Admitting: *Deleted

## 2016-03-14 LAB — INSULIN, RANDOM: Insulin: 19.5 u[IU]/mL (ref 2.0–19.6)

## 2016-03-14 LAB — MICROALBUMIN / CREATININE URINE RATIO
Creatinine, Urine: 137 mg/dL (ref 20–320)
Microalb Creat Ratio: 7 ug/mg{creat} (ref <30)
Microalb, Ur: 1 mg/dL

## 2016-03-14 LAB — VITAMIN D 25 HYDROXY (VIT D DEFICIENCY, FRACTURES): Vit D, 25-Hydroxy: 73 ng/mL (ref 30–100)

## 2016-03-14 NOTE — Telephone Encounter (Signed)
Pt aware of lab results 

## 2016-03-21 ENCOUNTER — Other Ambulatory Visit: Payer: Self-pay | Admitting: Internal Medicine

## 2016-03-26 ENCOUNTER — Other Ambulatory Visit: Payer: Self-pay | Admitting: Internal Medicine

## 2016-03-29 ENCOUNTER — Other Ambulatory Visit: Payer: Self-pay | Admitting: *Deleted

## 2016-03-29 MED ORDER — SERTRALINE HCL 100 MG PO TABS: ORAL_TABLET | ORAL | Status: DC

## 2016-03-29 MED ORDER — POTASSIUM CHLORIDE CRYS ER 20 MEQ PO TBCR: 20.0000 meq | EXTENDED_RELEASE_TABLET | Freq: Three times a day (TID) | ORAL | Status: DC

## 2016-04-11 ENCOUNTER — Other Ambulatory Visit: Payer: Self-pay | Admitting: Internal Medicine

## 2016-04-11 NOTE — Telephone Encounter (Signed)
RX CALLED INTO [facility].

## 2016-04-13 DIAGNOSIS — Z6824 Body mass index (BMI) 24.0-24.9, adult: Secondary | ICD-10-CM | POA: Diagnosis not present

## 2016-04-13 DIAGNOSIS — Z124 Encounter for screening for malignant neoplasm of cervix: Secondary | ICD-10-CM | POA: Diagnosis not present

## 2016-04-13 DIAGNOSIS — Z01419 Encounter for gynecological examination (general) (routine) without abnormal findings: Secondary | ICD-10-CM | POA: Diagnosis not present

## 2016-04-17 ENCOUNTER — Other Ambulatory Visit: Payer: Self-pay | Admitting: Internal Medicine

## 2016-04-17 ENCOUNTER — Other Ambulatory Visit: Payer: Self-pay | Admitting: Physician Assistant

## 2016-04-20 ENCOUNTER — Other Ambulatory Visit: Payer: Self-pay | Admitting: *Deleted

## 2016-04-20 ENCOUNTER — Other Ambulatory Visit: Payer: Self-pay | Admitting: Internal Medicine

## 2016-04-20 DIAGNOSIS — R609 Edema, unspecified: Secondary | ICD-10-CM

## 2016-04-20 DIAGNOSIS — J4520 Mild intermittent asthma, uncomplicated: Secondary | ICD-10-CM

## 2016-04-20 DIAGNOSIS — I10 Essential (primary) hypertension: Secondary | ICD-10-CM

## 2016-04-20 DIAGNOSIS — K219 Gastro-esophageal reflux disease without esophagitis: Secondary | ICD-10-CM

## 2016-04-20 MED ORDER — ATORVASTATIN CALCIUM 80 MG PO TABS: ORAL_TABLET | ORAL | 99 refills | Status: DC

## 2016-04-20 MED ORDER — LEVOTHYROXINE SODIUM 50 MCG PO TABS: ORAL_TABLET | ORAL | 3 refills | Status: DC

## 2016-04-20 MED ORDER — PANTOPRAZOLE SODIUM 40 MG PO TBEC: 40.0000 mg | DELAYED_RELEASE_TABLET | Freq: Every day | ORAL | 3 refills | Status: DC

## 2016-04-20 MED ORDER — MELOXICAM 15 MG PO TABS: 15.0000 mg | ORAL_TABLET | Freq: Every day | ORAL | 0 refills | Status: DC

## 2016-04-20 MED ORDER — FLUTICASONE FUROATE-VILANTEROL 200-25 MCG/INH IN AEPB: 1.0000 | INHALATION_SPRAY | Freq: Every day | RESPIRATORY_TRACT | 0 refills | Status: DC

## 2016-04-20 MED ORDER — BUPROPION HCL ER (XL) 300 MG PO TB24: ORAL_TABLET | ORAL | 3 refills | Status: DC

## 2016-04-20 MED ORDER — SERTRALINE HCL 100 MG PO TABS: ORAL_TABLET | ORAL | 1 refills | Status: DC

## 2016-04-20 MED ORDER — POTASSIUM CHLORIDE CRYS ER 20 MEQ PO TBCR: 20.0000 meq | EXTENDED_RELEASE_TABLET | Freq: Three times a day (TID) | ORAL | 0 refills | Status: DC

## 2016-04-21 ENCOUNTER — Other Ambulatory Visit: Payer: Self-pay | Admitting: *Deleted

## 2016-04-21 DIAGNOSIS — F329 Major depressive disorder, single episode, unspecified: Secondary | ICD-10-CM

## 2016-04-21 DIAGNOSIS — F32A Depression, unspecified: Secondary | ICD-10-CM

## 2016-04-21 MED ORDER — METHYLPHENIDATE HCL 20 MG PO TABS: ORAL_TABLET | ORAL | 0 refills | Status: DC

## 2016-05-22 ENCOUNTER — Encounter: Payer: Self-pay | Admitting: Physician Assistant

## 2016-05-22 ENCOUNTER — Ambulatory Visit (INDEPENDENT_AMBULATORY_CARE_PROVIDER_SITE_OTHER): Payer: Medicare Other | Admitting: Physician Assistant

## 2016-05-22 ENCOUNTER — Ambulatory Visit (HOSPITAL_COMMUNITY)
Admission: RE | Admit: 2016-05-22 | Discharge: 2016-05-22 | Disposition: A | Discharge status: Home / Self Care | Payer: Medicare Other | Source: Ambulatory Visit | Attending: Physician Assistant | Admitting: Physician Assistant

## 2016-05-22 VITALS — BP 110/80 | HR 87 | Temp 97.4°F | Resp 14 | Ht 63.75 in | Wt 142.0 lb

## 2016-05-22 DIAGNOSIS — R05 Cough: Secondary | ICD-10-CM | POA: Diagnosis not present

## 2016-05-22 DIAGNOSIS — R0781 Pleurodynia: Secondary | ICD-10-CM

## 2016-05-22 DIAGNOSIS — F17200 Nicotine dependence, unspecified, uncomplicated: Secondary | ICD-10-CM | POA: Insufficient documentation

## 2016-05-22 DIAGNOSIS — Z9013 Acquired absence of bilateral breasts and nipples: Secondary | ICD-10-CM | POA: Insufficient documentation

## 2016-05-22 DIAGNOSIS — R0789 Other chest pain: Secondary | ICD-10-CM | POA: Diagnosis not present

## 2016-05-22 DIAGNOSIS — J45909 Unspecified asthma, uncomplicated: Secondary | ICD-10-CM | POA: Diagnosis not present

## 2016-05-22 DIAGNOSIS — R079 Chest pain, unspecified: Secondary | ICD-10-CM | POA: Diagnosis not present

## 2016-05-22 MED ORDER — PREDNISONE 20 MG PO TABS: ORAL_TABLET | ORAL | 0 refills | Status: DC

## 2016-05-22 MED ORDER — TRAMADOL HCL 50 MG PO TABS: ORAL_TABLET | ORAL | 0 refills | Status: DC

## 2016-05-22 NOTE — Patient Instructions (Signed)

## 2016-05-22 NOTE — Progress Notes (Signed)
Subjective:    Patient ID: Margaret Nelson, female    DOB: March 23, 1951, 65 y.o.   MRN: [id number]  HPI 65 y.o. smoking white female presents with right rib pain x 63 x 3 days. No injury. She has some sinus congestion, mild cough x several weeks, no fever, chills, SOB, leg swelling. She is on mobic daily. No recent surgery, recent trips, no history of blood clots. Last CXR 08/02/2015.   Blood pressure 110/80, pulse 87, temperature 97.4 F (36.3 C), resp. rate 14, height 5' 3.75" (1.619 m), weight 142 lb (64.4 kg), SpO2 97 %.  Medications Current Outpatient Prescriptions on File Prior to Visit  Medication Sig  . ALPRAZolam (XANAX) 1 MG tablet take 1 tablet by mouth four times a day  . atorvastatin (LIPITOR) 80 MG tablet Take 1/2 to 1 tablet daily  for Cholesterol -  . bumetanide (BUMEX) 2 MG tablet take 1 tablet by mouth twice a day  . buPROPion (WELLBUTRIN XL) 300 MG 24 hr tablet take 1 tablet by mouth every morning for MOOD  . cholecalciferol 2000 UNITS tablet Take 1 tablet (2,000 Units total) by mouth 3 (three) times daily.  . Flaxseed, Linseed, (FLAX SEED OIL) 1300 MG CAPS Take 1,300 mg by mouth 3 (three) times daily.  . fluticasone furoate-vilanterol (BREO ELLIPTA) 200-25 MCG/INH AEPB Inhale 1 puff into the lungs daily.  Marland Kitchen levothyroxine (SYNTHROID, LEVOTHROID) 50 MCG tablet TAKE 1 TO 1 AND 1/2 TABLET BY MOUTH ONCE A DAY  . magnesium gluconate (MAGONATE) 500 MG tablet Take 500 mg by mouth daily.  . meloxicam (MOBIC) 15 MG tablet Take 1 tablet (15 mg total) by mouth daily.  . methylphenidate (RITALIN) 20 MG tablet Take 1/2 to 1 tablet 3 x day as directed for depression.  Do not take more than 5 days per week  . Omega-3 Fatty Acids (FISH OIL) 1000 MG CAPS Take 1 capsule (1,000 mg total) by mouth 3 (three) times daily.  . pantoprazole (PROTONIX) 40 MG tablet Take 1 tablet (40 mg total) by mouth daily.  . potassium chloride SA (K-DUR,KLOR-CON) 20 MEQ tablet Take 1 tablet (20 mEq total) by mouth 3  (three) times daily.  . 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  Marland Kitchen sertraline (ZOLOFT) 100 MG tablet take 2 tablets by mouth once daily at bedtime   No current facility-administered medications on file prior to visit.     Problem list She has Essential hypertension; Hyperlipidemia; Depression, controlled; Vitamin D deficiency; Prediabetes; Hypothyroidism; Medication management; GERD ; Tobacco use disorder; Encounter for Medicare annual wellness exam; Extrinsic asthma; and Body mass index (BMI) of 23.0-23.9 in adult on her problem list.  SOCIAL HISTORY She  reports that she has been smoking.  She has been smoking about 0.50 packs per day. She has never used smokeless tobacco. She reports that she drinks alcohol. She reports that she does not use drugs.  Review of Systems  Constitutional: Negative.  Negative for chills and fever.  HENT: Positive for congestion.   Respiratory: Positive for cough. Negative for apnea, chest tightness, shortness of breath and wheezing.   Cardiovascular: Positive for chest pain. Negative for palpitations and leg swelling.       Objective:   Physical Exam  Constitutional: She appears well-developed and well-nourished.  HENT:  Right Ear: External ear normal.  Left Ear: External ear normal.  Nose: Nose normal.  Mouth/Throat: Oropharynx is clear  and moist. No oropharyngeal exudate.  Neck: Normal range of motion. Neck supple.  Pulmonary/Chest: Effort normal and breath sounds normal. She has no wheezes. She exhibits tenderness.  Musculoskeletal: She exhibits tenderness (right side ribs, no rash).  Lymphadenopathy:    She has no cervical adenopathy.      Assessment & Plan:  1. Rib pain on right side - DG Chest 2 View; Future - DG Ribs Unilateral Right; Future - predniSONE (DELTASONE) 20 MG tablet; 2 tablets daily for 3 days, 1 tablet daily for 4 days.  Dispense: 10  tablet; Refill: 0 - traMADol (ULTRAM) 50 MG tablet; 1 pill twice daily as needed for pain/cough  Dispense: 30 tablet; Refill: 0  Will go to ER if any SOB, severe chest pain, etc.

## 2016-06-15 ENCOUNTER — Ambulatory Visit: Payer: Self-pay | Admitting: Internal Medicine

## 2016-06-23 ENCOUNTER — Other Ambulatory Visit: Payer: Self-pay | Admitting: *Deleted

## 2016-06-23 DIAGNOSIS — F329 Major depressive disorder, single episode, unspecified: Secondary | ICD-10-CM

## 2016-06-23 DIAGNOSIS — F32A Depression, unspecified: Secondary | ICD-10-CM

## 2016-06-23 MED ORDER — METHYLPHENIDATE HCL 20 MG PO TABS
ORAL_TABLET | ORAL | 0 refills | Status: DC
Start: 2016-06-23 — End: 2016-08-02

## 2016-07-05 ENCOUNTER — Ambulatory Visit (INDEPENDENT_AMBULATORY_CARE_PROVIDER_SITE_OTHER): Payer: Medicare Other | Admitting: Internal Medicine

## 2016-07-05 VITALS — BP 98/54 | HR 82 | Temp 98.0°F | Resp 16 | Ht 63.75 in | Wt 140.0 lb

## 2016-07-05 DIAGNOSIS — I10 Essential (primary) hypertension: Secondary | ICD-10-CM

## 2016-07-05 DIAGNOSIS — Z79899 Other long term (current) drug therapy: Secondary | ICD-10-CM

## 2016-07-05 DIAGNOSIS — J45909 Unspecified asthma, uncomplicated: Secondary | ICD-10-CM | POA: Diagnosis not present

## 2016-07-05 DIAGNOSIS — Z23 Encounter for immunization: Secondary | ICD-10-CM | POA: Diagnosis not present

## 2016-07-05 DIAGNOSIS — E782 Mixed hyperlipidemia: Secondary | ICD-10-CM | POA: Diagnosis not present

## 2016-07-05 DIAGNOSIS — R6889 Other general symptoms and signs: Secondary | ICD-10-CM | POA: Diagnosis not present

## 2016-07-05 DIAGNOSIS — Z6823 Body mass index (BMI) 23.0-23.9, adult: Secondary | ICD-10-CM | POA: Diagnosis not present

## 2016-07-05 DIAGNOSIS — R7303 Prediabetes: Secondary | ICD-10-CM

## 2016-07-05 DIAGNOSIS — E079 Disorder of thyroid, unspecified: Secondary | ICD-10-CM | POA: Diagnosis not present

## 2016-07-05 DIAGNOSIS — F17200 Nicotine dependence, unspecified, uncomplicated: Secondary | ICD-10-CM

## 2016-07-05 DIAGNOSIS — E559 Vitamin D deficiency, unspecified: Secondary | ICD-10-CM

## 2016-07-05 DIAGNOSIS — K219 Gastro-esophageal reflux disease without esophagitis: Secondary | ICD-10-CM | POA: Diagnosis not present

## 2016-07-05 DIAGNOSIS — Z0000 Encounter for general adult medical examination without abnormal findings: Secondary | ICD-10-CM

## 2016-07-05 DIAGNOSIS — Z0001 Encounter for general adult medical examination with abnormal findings: Secondary | ICD-10-CM | POA: Diagnosis not present

## 2016-07-05 DIAGNOSIS — F32A Depression, unspecified: Secondary | ICD-10-CM

## 2016-07-05 DIAGNOSIS — F329 Major depressive disorder, single episode, unspecified: Secondary | ICD-10-CM

## 2016-07-05 LAB — CBC WITH DIFFERENTIAL/PLATELET
Basophils Absolute: 81 cells/uL (ref 0–200)
Basophils Relative: 1 %
Eosinophils Absolute: 567 cells/uL — ABNORMAL HIGH (ref 15–500)
Eosinophils Relative: 7 %
HCT: 39.9 % (ref 35.0–45.0)
Hemoglobin: 13.1 g/dL (ref 11.7–15.5)
Lymphocytes Relative: 44 %
Lymphs Abs: 3564 cells/uL (ref 850–3900)
MCH: 30.9 pg (ref 27.0–33.0)
MCHC: 32.8 g/dL (ref 32.0–36.0)
MCV: 94.1 fL (ref 80.0–100.0)
MPV: 10.4 fL (ref 7.5–12.5)
Monocytes Absolute: 486 cells/uL (ref 200–950)
Monocytes Relative: 6 %
Neutro Abs: 3402 cells/uL (ref 1500–7800)
Neutrophils Relative %: 42 %
Platelets: 259 10*3/uL (ref 140–400)
RBC: 4.24 MIL/uL (ref 3.80–5.10)
RDW: 13.5 % (ref 11.0–15.0)
WBC: 8.1 10*3/uL (ref 3.8–10.8)

## 2016-07-05 LAB — BASIC METABOLIC PANEL WITH GFR
BUN: 17 mg/dL (ref 7–25)
CO2: 27 mmol/L (ref 20–31)
Calcium: 9.2 mg/dL (ref 8.6–10.4)
Chloride: 104 mmol/L (ref 98–110)
Creat: 0.87 mg/dL (ref 0.50–0.99)
GFR, Est African American: 82 mL/min (ref >=60)
GFR, Est Non African American: 71 mL/min (ref >=60)
Glucose, Bld: 87 mg/dL (ref 65–99)
Potassium: 4 mmol/L (ref 3.5–5.3)
Sodium: 141 mmol/L (ref 135–146)

## 2016-07-05 LAB — HEPATIC FUNCTION PANEL
ALT: 13 U/L (ref 6–29)
AST: 14 U/L (ref 10–35)
Albumin: 4.1 g/dL (ref 3.6–5.1)
Alkaline Phosphatase: 82 U/L (ref 33–130)
Bilirubin, Direct: 0.1 mg/dL (ref <=0.2)
Indirect Bilirubin: 0.3 mg/dL (ref 0.2–1.2)
Total Bilirubin: 0.4 mg/dL (ref 0.2–1.2)
Total Protein: 6.5 g/dL (ref 6.1–8.1)

## 2016-07-05 LAB — LIPID PANEL
Cholesterol: 196 mg/dL (ref 125–200)
HDL: 57 mg/dL (ref >=46)
LDL Cholesterol: 84 mg/dL (ref <130)
Total CHOL/HDL Ratio: 3.4 Ratio (ref <=5.0)
Triglycerides: 277 mg/dL — ABNORMAL HIGH (ref <150)
VLDL: 55 mg/dL — ABNORMAL HIGH (ref <30)

## 2016-07-05 LAB — TSH: TSH: 1.62 mIU/L

## 2016-07-05 MED ORDER — CLOTRIMAZOLE-BETAMETHASONE 1-0.05 % EX CREA: TOPICAL_CREAM | CUTANEOUS | 1 refills | Status: DC

## 2016-07-05 NOTE — Patient Instructions (Signed)
[facility] Hours: Monday-Thursday 830-8pm  Friday 830AM-7PM Address: [place] [phone]

## 2016-07-05 NOTE — Progress Notes (Signed)
MEDICARE ANNUAL WELLNESS VISIT AND FOLLOW UP  Assessment:    1. Essential hypertension -cont monitoring -mildly low today - TSH  2. Hypothyroidism -patient not taking medication on an empty stomach - TSH  3. Hyperlipidemia -cont diet and exercise - Lipid panel  4. Prediabetes -cont diet and exercise - Hemoglobin A1c  5. Vitamin D deficiency -Cont Vit D supplement -recheck Vit D at 6 month intervals  6. Medication management  - CBC with Differential/Platelet - BASIC METABOLIC PANEL WITH GFR - Hepatic function panel  7. Extrinsic asthma without complication, unspecified asthma severity, unspecified whether persistent -cont breo -cont anoro -still smoking  8. Gastroesophageal reflux disease, esophagitis presence not specified -cont protonix -cont ranitidine -cont conservative measures   9. Body mass index (BMI) of 23.0-23.9 in adult -cont diet and exercise  10. Depression, controlled -cont xanax -cont zoloft -cont wellbutrin  11. Encounter for Medicare annual wellness exam -due next year  63. Tobacco use disorder -recommended quitting -patient not interested in quitting  13. Need for prophylactic vaccination and inoculation against influenza  - Flu Vaccine QUAD with presevative   Over 30 minutes of exam, counseling, chart review, and critical decision making was performed  Future Appointments Date Time Provider [place]  09/26/2016 2:30 PM Unk Pinto, MD [facility] None  04/24/2017 3:00 PM Unk Pinto, MD [facility] None    Plan:   During the course of the visit the patient was educated and counseled about appropriate screening and preventive services including:    Pneumococcal vaccine   Influenza vaccine  Td vaccine  Prevnar 13  Screening electrocardiogram  Screening mammography  Bone densitometry screening  Colorectal cancer screening  Diabetes screening  Glaucoma screening  Nutrition counseling   Advanced  directives: given info/requested copies   Subjective:   Margaret Nelson is a 65 y.o. female who presents for Medicare Annual Wellness Visit and 3 month follow up on hypertension, prediabetes, hyperlipidemia, vitamin D def.   Her blood pressure has been controlled at home, today their BP is BP: (!) 98/54 She does workout. She denies chest pain, shortness of breath, dizziness.   She is on cholesterol medication and denies myalgias. Her cholesterol is at goal. The cholesterol last visit was:   Lab Results  Component Value Date   CHOL 184 03/13/2016   HDL 68 03/13/2016   LDLCALC 79 03/13/2016   TRIG 184 (H) 03/13/2016   CHOLHDL 2.7 03/13/2016    Lab Results  Component Value Date   HGBA1C 5.8 (H) 03/13/2016   Last GFR Lab Results  Component Value Date   GFRNONAA 53 (L) 03/13/2016   Lab Results  Component Value Date   GFRAA 61 03/13/2016   Patient is on Vitamin D supplement. Lab Results  Component Value Date   VD25OH 48 03/13/2016      She reports that she has been having a lot of anhedonia recently.  She reports that she does drink wine daily.  She   She does take her thyroid medication.  She reports that she does take it with her other medications.     Medication Review Current Outpatient Prescriptions on File Prior to Visit  Medication Sig Dispense Refill  . ALPRAZolam (XANAX) 1 MG tablet take 1 tablet by mouth four times a day 120 tablet 2  . atorvastatin (LIPITOR) 80 MG tablet Take 1/2 to 1 tablet daily  for Cholesterol - 30 tablet 99  . bumetanide (BUMEX) 2 MG tablet take 1 tablet by mouth twice a day  180 tablet 1  . buPROPion (WELLBUTRIN XL) 300 MG 24 hr tablet take 1 tablet by mouth every morning for MOOD 90 tablet 3  . cholecalciferol 2000 UNITS tablet Take 1 tablet (2,000 Units total) by mouth 3 (three) times daily.    . Flaxseed, Linseed, (FLAX SEED OIL) 1300 MG CAPS Take 1,300 mg by mouth 3 (three) times daily.    . fluticasone furoate-vilanterol (BREO  ELLIPTA) 200-25 MCG/INH AEPB Inhale 1 puff into the lungs daily. 28 each 0  . levothyroxine (SYNTHROID, LEVOTHROID) 50 MCG tablet TAKE 1 TO 1 AND 1/2 TABLET BY MOUTH ONCE A DAY 90 tablet 3  . meloxicam (MOBIC) 15 MG tablet Take 1 tablet (15 mg total) by mouth daily. 90 tablet 0  . methylphenidate (RITALIN) 20 MG tablet Take 1/2 to 1 tablet 3 x day as directed for depression.  Do not take more than 5 days per week 90 tablet 0  . Omega-3 Fatty Acids (FISH OIL) 1000 MG CAPS Take 1 capsule (1,000 mg total) by mouth 3 (three) times daily.  0  . pantoprazole (PROTONIX) 40 MG tablet Take 1 tablet (40 mg total) by mouth daily. 90 tablet 3  . potassium chloride SA (K-DUR,KLOR-CON) 20 MEQ tablet Take 1 tablet (20 mEq total) by mouth 3 (three) times daily. 270 tablet 0  . 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 180 tablet 98  . sertraline (ZOLOFT) 100 MG tablet take 2 tablets by mouth once daily at bedtime 180 tablet 1   No current facility-administered medications on file prior to visit.     Allergies: Allergies  Allergen Reactions  . Codeine Other (See Comments)    Abdominal pain  . Erythromycin Base     GI upset  . Penicillins Rash    Current Problems (verified) has Essential hypertension; Hyperlipidemia; Depression, controlled; Vitamin D deficiency; Prediabetes; Hypothyroidism; Medication management; GERD ; Tobacco use disorder; Encounter for Medicare annual wellness exam; Extrinsic asthma; and Body mass index (BMI) of 23.0-23.9 in adult on her problem list.  Screening Tests Immunization History  Administered Date(s) Administered  . Influenza Split 06/22/2015  . Influenza,inj,quad, With Preservative 07/05/2016  . Pneumococcal-Unspecified 09/25/2004  . Td 09/25/2004  . Tdap 12/01/2015    Preventative care: Last colonoscopy: 2015  Last mammogram: 2015, has not had one since then  DEXA:No  indicated at this time.  Prior vaccinations: TD or Tdap: 2017  Influenza: 2017 Pneumococcal: 2006 Shingles/Zostavax: Declined secondary to cost  Names of Other Physician/Practitioners you currently use: 1. [facility]- here for primary care 2. Dr. Katy Fitch, eye doctor, last visit 2016 3. Dr. Langley Gauss, dentist, last visit 2017 Patient Care Team: Unk Pinto, MD as PCP - General (Internal Medicine) Dian Queen, MD as Consulting Physician (Obstetrics and Gynecology) Milus Banister, MD as Attending Physician (Gastroenterology)  Surgical: She  has a past surgical history that includes Cholecystectomy (1975); Nasal sinus surgery (1996); Liposuction (1998); Breast surgery (Bilateral, 1983); Breast reconstruction (2008); Appendectomy; bil breast lumpectomy; bilateral breast masectomy (no breast cancer); and EUS (N/A, 04/23/2014). Family Her family history includes COPD in her mother; Diabetes in her sister; Early death in her father and sister; Emphysema in her mother; Lung cancer in her mother. Social history  She reports that she has been smoking.  She has been smoking about 0.50 packs per day. She has never used smokeless tobacco. She reports that she  drinks alcohol. She reports that she does not use drugs.  MEDICARE WELLNESS OBJECTIVES: Physical activity:   Cardiac risk factors:   Depression/mood screen:   Depression screen [facility] 2/9 03/13/2016  Decreased Interest 0  Down, Depressed, Hopeless 0  PHQ - 2 Score 0  Altered sleeping -  Tired, decreased energy -  Change in appetite -  Feeling bad or failure about yourself  -  Trouble concentrating -  Moving slowly or fidgety/restless -  Suicidal thoughts -  PHQ-9 Score -  Difficult doing work/chores -    ADLs:  In your present state of health, do you have any difficulty performing the following activities: 03/13/2016  Hearing? N  Vision? N  Difficulty concentrating or making decisions? N   Walking or climbing stairs? N  Dressing or bathing? N  Doing errands, shopping? N  Some recent data might be hidden     Cognitive Testing  Alert? Yes  Normal Appearance?Yes  Oriented to person? Yes  Place? Yes   Time? Yes  Recall of three objects?  Yes  Can perform simple calculations? Yes  Displays appropriate judgment?Yes  Can read the correct time from a watch face?Yes  EOL planning: Does patient have an advance directive?: No Would patient like information on creating an advanced directive?: Yes - Educational materials given   Objective:   Today's Vitals   07/05/16 1429  BP: (!) 98/54  Pulse: 82  Resp: 16  Temp: 98 F (36.7 C)  TempSrc: Temporal  Weight: 140 lb (63.5 kg)  Height: 5' 3.75" (1.619 m)   Body mass index is 24.22 kg/m.  General appearance: alert, no distress, WD/WN,  female HEENT: normocephalic, sclerae anicteric, TMs pearly, nares patent, no discharge or erythema, pharynx normal Oral cavity: MMM, no lesions Neck: supple, no lymphadenopathy, no thyromegaly, no masses Heart: RRR, normal S1, S2, no murmurs Lungs: CTA bilaterally, no wheezes, rhonchi, or rales Abdomen: +bs, soft, non tender, non distended, no masses, no hepatomegaly, no splenomegaly Musculoskeletal: nontender, no swelling, no obvious deformity Extremities: no edema, no cyanosis, no clubbing Pulses: 2+ symmetric, upper and lower extremities, normal cap refill Neurological: alert, oriented x 3, CN2-12 intact, strength normal upper extremities and lower extremities, sensation normal throughout, DTRs 2+ throughout, no cerebellar signs, gait normal Psychiatric: normal affect, behavior normal, pleasant  Breast: defer Gyn: defer Rectal: defer   Medicare Attestation I have personally reviewed: The patient's medical and social history Their use of alcohol, tobacco or illicit drugs Their current medications and supplements The patient's functional ability including ADLs,fall risks, home  safety risks, cognitive, and hearing and visual impairment Diet and physical activities Evidence for depression or mood disorders  The patient's weight, height, BMI, and visual acuity have been recorded in the chart.  I have made referrals, counseling, and provided education to the patient based on review of the above and I have provided the patient with a written personalized care plan for preventive services.     [name], PA-C   07/05/2016

## 2016-07-06 LAB — HEMOGLOBIN A1C
Hgb A1c MFr Bld: 5.3 % (ref <5.7)
Mean Plasma Glucose: 105 mg/dL

## 2016-08-02 ENCOUNTER — Other Ambulatory Visit: Payer: Self-pay | Admitting: *Deleted

## 2016-08-02 ENCOUNTER — Other Ambulatory Visit: Payer: Self-pay | Admitting: Internal Medicine

## 2016-08-02 DIAGNOSIS — J4520 Mild intermittent asthma, uncomplicated: Secondary | ICD-10-CM

## 2016-08-02 MED ORDER — METHYLPHENIDATE HCL 20 MG PO TABS: ORAL_TABLET | ORAL | 0 refills | Status: DC

## 2016-09-20 DIAGNOSIS — B029 Zoster without complications: Secondary | ICD-10-CM | POA: Diagnosis not present

## 2016-09-21 ENCOUNTER — Telehealth: Payer: Self-pay | Admitting: *Deleted

## 2016-09-21 NOTE — Telephone Encounter (Signed)
Patient called and states she is out of town and was diagnosed with shingles.  She was given RXs for Valtrex and Gabapentin and asked if Dr Melford Aase agrees with those medications.  Per Dr Melford Aase, those are the correct medications to treat shingles.  Patient is aware.

## 2016-09-26 ENCOUNTER — Ambulatory Visit: Payer: Self-pay | Admitting: Internal Medicine

## 2016-09-26 NOTE — Progress Notes (Signed)
Rescheduled

## 2016-10-04 ENCOUNTER — Encounter: Payer: Self-pay | Admitting: Internal Medicine

## 2016-10-04 ENCOUNTER — Ambulatory Visit (INDEPENDENT_AMBULATORY_CARE_PROVIDER_SITE_OTHER): Payer: Medicare Other | Admitting: Internal Medicine

## 2016-10-04 VITALS — BP 112/72 | HR 72 | Temp 97.3°F | Resp 16 | Ht 63.75 in | Wt 145.6 lb

## 2016-10-04 DIAGNOSIS — E782 Mixed hyperlipidemia: Secondary | ICD-10-CM

## 2016-10-04 DIAGNOSIS — E079 Disorder of thyroid, unspecified: Secondary | ICD-10-CM

## 2016-10-04 DIAGNOSIS — F329 Major depressive disorder, single episode, unspecified: Secondary | ICD-10-CM | POA: Diagnosis not present

## 2016-10-04 DIAGNOSIS — R7303 Prediabetes: Secondary | ICD-10-CM

## 2016-10-04 DIAGNOSIS — Z79899 Other long term (current) drug therapy: Secondary | ICD-10-CM | POA: Diagnosis not present

## 2016-10-04 DIAGNOSIS — I10 Essential (primary) hypertension: Secondary | ICD-10-CM | POA: Diagnosis not present

## 2016-10-04 DIAGNOSIS — E559 Vitamin D deficiency, unspecified: Secondary | ICD-10-CM | POA: Diagnosis not present

## 2016-10-04 DIAGNOSIS — K219 Gastro-esophageal reflux disease without esophagitis: Secondary | ICD-10-CM | POA: Diagnosis not present

## 2016-10-04 DIAGNOSIS — F32A Depression, unspecified: Secondary | ICD-10-CM

## 2016-10-04 LAB — CBC WITH DIFFERENTIAL/PLATELET
Basophils Absolute: 90 cells/uL (ref 0–200)
Basophils Relative: 1 %
Eosinophils Absolute: 540 cells/uL — ABNORMAL HIGH (ref 15–500)
Eosinophils Relative: 6 %
HCT: 38.9 % (ref 35.0–45.0)
Hemoglobin: 12.8 g/dL (ref 11.7–15.5)
Lymphocytes Relative: 46 %
Lymphs Abs: 4140 cells/uL — ABNORMAL HIGH (ref 850–3900)
MCH: 31.6 pg (ref 27.0–33.0)
MCHC: 32.9 g/dL (ref 32.0–36.0)
MCV: 96 fL (ref 80.0–100.0)
MPV: 10.4 fL (ref 7.5–12.5)
Monocytes Absolute: 450 cells/uL (ref 200–950)
Monocytes Relative: 5 %
Neutro Abs: 3780 cells/uL (ref 1500–7800)
Neutrophils Relative %: 42 %
Platelets: 295 10*3/uL (ref 140–400)
RBC: 4.05 MIL/uL (ref 3.80–5.10)
RDW: 13.8 % (ref 11.0–15.0)
WBC: 9 10*3/uL (ref 3.8–10.8)

## 2016-10-04 NOTE — Patient Instructions (Signed)

## 2016-10-04 NOTE — Progress Notes (Signed)
[name], [name]        [name], [name]       [name], [name]  [facility]                [address] [place], [place] [id number] Telephone [phone] Telefax [phone] ______________________________________________________________________     [name] presents fo6 month follow up with Hypertension, Hyperlipidemia, Pre-Diabetes, Hypothyroidism  and Vitamin D Deficiency. Patient also has GERD controlled on prudent diet and her current meds.        Patient  has been followed by Dy Rup[facility] for long hx/o severe Depression and had undergone ECT x 9 in 2008 and has been on SS Disability since that time. She's she's stabilized over the last few years  on current meds including methylphenidate as recommended by Dr [facility] for her severe Depression.      Patient is treated for HTN (2008) & BP has been controlled at home. Today's BP is at goal - 112/72. Patient has had no complaints of any cardiac type chest pain, palpitations, dyspnea/orthopnea/PND, dizziness, claudication, or dependent edema.     Hyperlipidemia is controlled with diet & meds. Patient denies myalgias or other med SE's. Last Lipids were at goal albeit elevated Trig's: Lab Results  Component Value Date   CHOL 196 07/05/2016   HDL 57 07/05/2016   LDLCALC 84 07/05/2016   TRIG 277 (H) 07/05/2016   CHOLHDL 3.4 07/05/2016      Also, the patient has history of PreDiabetes circa 2012 with A1c 5.7% and has had no symptoms of reactive hypoglycemia, diabetic polys, paresthesias or visual blurring.  Last A1c was at goal: Lab Results  Component Value Date   HGBA1C 5.3 07/05/2016      Patient was dx'd Hypothyroid in 2010 and has since been maintained Euthyroid on replacement therapy. Further, the patient also has history of Vitamin D Deficiency and supplements vitamin D without any suspected  side-effects. Last vitamin D was at goal:  Lab Results  Component Value Date   VD25OH 73 03/13/2016   Current Outpatient Prescriptions on File Prior to Visit  Medication Sig  . ALPRAZolam (XANAX) 1 MG tablet take 1 tablet by mouth four times a day  . atorvastatin (LIPITOR) 80 MG tablet Take 1/2 to 1 tablet daily  for Cholesterol -  . BREO ELLIPTA 200-25 MCG/INH AEPB inhale 1 puff INTO THE LUNGS DAILY  . bumetanide (BUMEX) 2 MG tablet take 1 tablet by mouth twice a day  . buPROPion (WELLBUTRIN XL) 300 MG 24 hr tablet take 1 tablet by mouth every morning for MOOD  . clotrimazole-betamethasone (LOTRISONE) cream Apply to affected area 2 times daily  . Flaxseed, Linseed, (FLAX SEED OIL) 1300 MG CAPS Take 1,300 mg by mouth 3 (three) times daily. (Patient taking differently: Take 1,000 mg by mouth daily. )  . levothyroxine (SYNTHROID, LEVOTHROID) 50 MCG tablet TAKE 1 TO 1 AND 1/2 TABLET BY MOUTH ONCE A DAY  . meloxicam (MOBIC) 15 MG tablet Take 1 tablet (15 mg total) by mouth daily.  . methylphenidate (RITALIN) 20 MG tablet Take 1/2 to 1 tablet 3 x day as directed for depression.  Do not take more than 5 days per week  . Omega-3 Fatty Acids (FISH OIL) 1000 MG CAPS Take 1 capsule (1,000 mg total) by mouth 3 (  three) times daily. (Patient taking differently: Take 1,000 mg by mouth daily. )  . pantoprazole (PROTONIX) 40 MG tablet Take 1 tablet (40 mg total) by mouth daily.  . potassium chloride SA (K-DUR,KLOR-CON) 20 MEQ tablet Take 1 tablet (20 mEq total) by mouth 3 (three) times daily.  . 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  Marland Kitchen sertraline (ZOLOFT) 100 MG tablet take 2 tablets by mouth once daily at bedtime   No current facility-administered medications on file prior to visit.    Allergies  Allergen Reactions  . Codeine Other (See Comments)    Abdominal pain  . Erythromycin Base     GI upset  .  Penicillins Rash   PMHx:   Past Medical History:  Diagnosis Date  . Allergy   . Anxiety   . Arthritis   . Asthma    as a child, out grew   . Blood transfusion without reported diagnosis   . Cancer ([place])    basal cell left temple, left side of nose  . Depression   . GERD (gastroesophageal reflux disease)   . Hyperlipidemia   . Hypertension   . Thyroid disease    hypo   Immunization History  Administered Date(s) Administered  . Influenza Split 06/22/2015  . Influenza,inj,quad, With Preservative 07/05/2016  . Pneumococcal-Unspecified 09/25/2004  . Td 09/25/2004  . Tdap 12/01/2015   Past Surgical History:  Procedure Laterality Date  . APPENDECTOMY    . bil breast lumpectomy    . bilateral breast masectomy  no breast cancer   bil breast removed to prevent ca.  Marland Kitchen BREAST RECONSTRUCTION  2008  . BREAST SURGERY Bilateral 1983   implants  . CHOLECYSTECTOMY  1975  . EUS N/A 04/23/2014   Procedure: LOWER ENDOSCOPIC ULTRASOUND (EUS);  Surgeon: Milus Banister, MD;  Location: [facility];  Service: Endoscopy;  Laterality: N/A;  . LIPOSUCTION  1998   with tummy tuck  . NASAL SINUS SURGERY  1996   FHx:    Reviewed / unchanged  SHx:    Reviewed / unchanged  Systems Review:  Constitutional: Denies fever, chills, wt changes, headaches, insomnia, fatigue, night sweats, change in appetite. Eyes: Denies redness, blurred vision, diplopia, discharge, itchy, watery eyes.  ENT: Denies discharge, congestion, post nasal drip, epistaxis, sore throat, earache, hearing loss, dental pain, tinnitus, vertigo, sinus pain, snoring.  CV: Denies chest pain, palpitations, irregular heartbeat, syncope, dyspnea, diaphoresis, orthopnea, PND, claudication or edema. Respiratory: denies cough, dyspnea, DOE, pleurisy, hoarseness, laryngitis, wheezing.  Gastrointestinal: Denies dysphagia, odynophagia, heartburn, reflux, water brash, abdominal pain or cramps, nausea, vomiting, bloating, diarrhea, constipation,  hematemesis, melena, hematochezia  or hemorrhoids. Genitourinary: Denies dysuria, frequency, urgency, nocturia, hesitancy, discharge, hematuria or flank pain. Musculoskeletal: Denies arthralgias, myalgias, stiffness, jt. swelling, pain, limping or strain/sprain.  Skin: Denies pruritus, rash, hives, warts, acne, eczema or change in skin lesion(s). Neuro: No weakness, tremor, incoordination, spasms, paresthesia or pain. Psychiatric: Denies confusion, memory loss or sensory loss. Endo: Denies change in weight, skin or hair change.  Heme/Lymph: No excessive bleeding, bruising or enlarged lymph nodes.  Physical Exam  BP 112/72   Pulse 72   Temp 97.3 F (36.3 C)   Resp 16   Ht 5' 3.75" (1.619 m)   Wt 145 lb 9.6 oz (66 kg)   BMI 25.19 kg/m   Appears well nourished and in no distress.  Eyes: PERRLA, EOMs, conjunctiva no  swelling or erythema. Sinuses: No frontal/maxillary tenderness ENT/Mouth: EAC's clear, TM's nl w/o erythema, bulging. Nares clear w/o erythema, swelling, exudates. Oropharynx clear without erythema or exudates. Oral hygiene is good. Tongue normal, non obstructing. Hearing intact.  Neck: Supple. Thyroid nl. Car 2+/2+ without bruits, nodes or JVD. Chest: Respirations nl with BS clear & equal w/o rales, rhonchi, wheezing or stridor.  Cor: Heart sounds normal w/ regular rate and rhythm without sig. murmurs, gallops, clicks, or rubs. Peripheral pulses normal and equal  without edema.  Abdomen: Soft & bowel sounds normal. Non-tender w/o guarding, rebound, hernias, masses, or organomegaly.  Lymphatics: Unremarkable.  Musculoskeletal: Full ROM all peripheral extremities, joint stability, 5/5 strength, and normal gait.  Skin: Warm, dry without exposed rashes, lesions or ecchymosis apparent.  Neuro: Cranial nerves intact, reflexes equal bilaterally. Sensory-motor testing grossly intact. Tendon reflexes grossly intact.  Pysch: Alert & oriented x 3.  Insight and judgement nl &  appropriate. No ideations.  Assessment and Plan:  - Continue medication, monitor blood pressure at home.  - Continue DASH diet. Reminder to go to the ER if any CP,  SOB, nausea, dizziness, severe HA, changes vision/speech,  left arm numbness and tingling and jaw pain.  - Continue diet/meds, exercise,& lifestyle modifications.  - Continue monitor periodic cholesterol/liver & renal functions   - Continue diet, exercise, lifestyle modifications.  - Monitor appropriate labs. - Continue supplementation.      Recommended regular exercise, BP monitoring, weight control, and discussed med and SE's. Recommended labs to assess and monitor clinical status. Further disposition pending results of labs. Over 30 minutes of exam, counseling, chart review was performed

## 2016-10-05 LAB — BASIC METABOLIC PANEL WITH GFR
BUN: 21 mg/dL (ref 7–25)
CO2: 25 mmol/L (ref 20–31)
Calcium: 9.3 mg/dL (ref 8.6–10.4)
Chloride: 102 mmol/L (ref 98–110)
Creat: 0.73 mg/dL (ref 0.50–0.99)
GFR, Est African American: >89 mL/min (ref >=60)
GFR, Est Non African American: 88 mL/min (ref >=60)
Glucose, Bld: 84 mg/dL (ref 65–99)
Potassium: 4.1 mmol/L (ref 3.5–5.3)
Sodium: 142 mmol/L (ref 135–146)

## 2016-10-05 LAB — LIPID PANEL
Cholesterol: 180 mg/dL (ref <200)
HDL: 58 mg/dL (ref >50)
LDL Cholesterol: 78 mg/dL (ref <100)
Total CHOL/HDL Ratio: 3.1 Ratio (ref <5.0)
Triglycerides: 220 mg/dL — ABNORMAL HIGH (ref <150)
VLDL: 44 mg/dL — ABNORMAL HIGH (ref <30)

## 2016-10-05 LAB — HEPATIC FUNCTION PANEL
ALT: 14 U/L (ref 6–29)
AST: 16 U/L (ref 10–35)
Albumin: 4 g/dL (ref 3.6–5.1)
Alkaline Phosphatase: 81 U/L (ref 33–130)
Bilirubin, Direct: 0.1 mg/dL (ref <=0.2)
Indirect Bilirubin: 0.2 mg/dL (ref 0.2–1.2)
Total Bilirubin: 0.3 mg/dL (ref 0.2–1.2)
Total Protein: 6.5 g/dL (ref 6.1–8.1)

## 2016-10-05 LAB — HEMOGLOBIN A1C
Hgb A1c MFr Bld: 5.2 % (ref <5.7)
Mean Plasma Glucose: 103 mg/dL

## 2016-10-05 LAB — INSULIN, RANDOM: Insulin: 3.2 u[IU]/mL (ref 2.0–19.6)

## 2016-10-05 LAB — MAGNESIUM: Magnesium: 2.1 mg/dL (ref 1.5–2.5)

## 2016-10-05 LAB — VITAMIN D 25 HYDROXY (VIT D DEFICIENCY, FRACTURES): Vit D, 25-Hydroxy: 54 ng/mL (ref 30–100)

## 2016-10-05 LAB — TSH: TSH: 1.48 mIU/L

## 2016-10-13 ENCOUNTER — Other Ambulatory Visit: Payer: Self-pay | Admitting: Internal Medicine

## 2016-10-13 ENCOUNTER — Other Ambulatory Visit: Payer: Self-pay | Admitting: Physician Assistant

## 2016-10-13 DIAGNOSIS — I10 Essential (primary) hypertension: Secondary | ICD-10-CM

## 2016-10-13 DIAGNOSIS — R609 Edema, unspecified: Secondary | ICD-10-CM

## 2016-10-23 ENCOUNTER — Other Ambulatory Visit: Payer: Self-pay | Admitting: *Deleted

## 2016-10-23 MED ORDER — METHYLPHENIDATE HCL 20 MG PO TABS: ORAL_TABLET | ORAL | 0 refills | Status: DC

## 2016-10-24 ENCOUNTER — Other Ambulatory Visit: Payer: Self-pay | Admitting: Internal Medicine

## 2016-11-21 ENCOUNTER — Other Ambulatory Visit: Payer: Self-pay | Admitting: *Deleted

## 2016-11-21 MED ORDER — METHYLPHENIDATE HCL 20 MG PO TABS: ORAL_TABLET | ORAL | 0 refills | Status: DC

## 2016-12-25 ENCOUNTER — Other Ambulatory Visit: Payer: Self-pay | Admitting: Internal Medicine

## 2016-12-25 NOTE — Telephone Encounter (Signed)
-   please call Alprazolam 

## 2017-01-04 ENCOUNTER — Ambulatory Visit: Payer: Self-pay | Admitting: Internal Medicine

## 2017-01-10 NOTE — Progress Notes (Signed)
3 MONTH FOLLOW UP  Assessment:   Essential hypertension -cont monitoring -mildly low today - TSH   Hypothyroidism -patient not taking medication on an empty stomach - TSH  Hyperlipidemia -cont diet and exercise - Lipid panel  Vitamin D deficiency -Cont Vit D supplement -recheck Vit D at 6 month intervals   Medication management - CBC with Differential/Platelet - BASIC METABOLIC PANEL WITH GFR - Hepatic function panel   Tobacco use disorder -recommended quitting -patient not interested in quitting  COPD exacerbation (HCC) -     azithromycin (ZITHROMAX) 250 MG tablet; Take 2 tablets (500 mg) on  Day 1,  followed by 1 tablet (250 mg) once daily on Days 2 through 5. -     predniSONE (DELTASONE) 20 MG tablet; 2 tablets daily for 3 days, 1 tablet daily for 4 days. -     promethazine-dextromethorphan (PROMETHAZINE-DM) 6.25-15 MG/5ML syrup; Take 5 mLs by mouth 4 (four) times daily as needed for cough. -     ipratropium-albuterol (DUONEB) 0.5-2.5 (3) MG/3ML nebulizer solution 3 mL; Take 3 mLs by nebulization once. -     albuterol (PROVENTIL HFA;VENTOLIN HFA) 108 (90 Base) MCG/ACT inhaler; Inhale 2 puffs into the lungs every 6 (six) hours as needed for wheezing or shortness of breath.     Over 30 minutes of exam, counseling, chart review, and critical decision making was performed  Future Appointments Date Time Provider [place]  04/24/2017 3:00 PM Unk Pinto, MD [facility] None    Subjective:   Margaret Nelson is a 66 y.o. female who presents for 3 month follow up on hypertension, prediabetes, hyperlipidemia, vitamin D def.   Her blood pressure has been controlled at home, today their BP is BP: 110/80 She does workout. She denies chest pain, shortness of breath, dizziness.  Non productive cough x 1 week with wheezing, has been on breo x 4 days in a row, is smoker, likely COPD,     She is on cholesterol medication and denies myalgias. Her cholesterol is at  goal. The cholesterol last visit was:   Lab Results  Component Value Date   CHOL 180 10/04/2016   HDL 58 10/04/2016   LDLCALC 78 10/04/2016   TRIG 220 (H) 10/04/2016   CHOLHDL 3.1 10/04/2016   Last A1C Lab Results  Component Value Date   HGBA1C 5.2 10/04/2016   Patient is on Vitamin D supplement. Lab Results  Component Value Date   VD25OH 54 10/04/2016      BMI is Body mass index is 25.08 kg/m., she is working on diet and exercise. Wt Readings from Last 3 Encounters:  01/11/17 145 lb (65.8 kg)  10/04/16 145 lb 9.6 oz (66 kg)  07/05/16 140 lb (63.5 kg)   She is on thyroid medication. Her medication was not changed last visit.   Lab Results  Component Value Date   TSH 1.48 10/04/2016  .     Medication Review Current Outpatient Prescriptions on File Prior to Visit  Medication Sig Dispense Refill  . ALPRAZolam (XANAX) 1 MG tablet take 1 tablet by mouth four times a day 120 tablet 0  . atorvastatin (LIPITOR) 80 MG tablet Take 1/2 to 1 tablet daily  for Cholesterol - 30 tablet 99  . BREO ELLIPTA 200-25 MCG/INH AEPB inhale 1 puff INTO THE LUNGS DAILY 60 each 11  . bumetanide (BUMEX) 2 MG tablet take 1 tablet by mouth twice a day 180 tablet 1  . buPROPion (WELLBUTRIN XL) 300 MG 24 hr tablet  take 1 tablet by mouth every morning for MOOD 90 tablet 3  . Cholecalciferol (VITAMIN D PO) Take 5,000 Units by mouth daily.    . clotrimazole-betamethasone (LOTRISONE) cream apply to affected area twice a day 45 g 1  . Flaxseed, Linseed, (FLAX SEED OIL) 1300 MG CAPS Take 1,300 mg by mouth 3 (three) times daily. (Patient taking differently: Take 1,000 mg by mouth daily. )    . gabapentin (NEURONTIN) 300 MG capsule TAKE 1 CAPSULE BY MOUTH ON DAY 1,  1 CAPSULE BY MOUTH TWICE A DAY...  (REFER TO PRESCRIPTION NOTES).  0  . levothyroxine (SYNTHROID, LEVOTHROID) 50 MCG tablet TAKE 1 TO 1 AND 1/2 TABLET BY MOUTH ONCE A DAY 90 tablet 3  . meloxicam (MOBIC) 15 MG tablet take 1 tablet by mouth once  daily 90 tablet 1  . methylphenidate (RITALIN) 20 MG tablet Take 1/2 to 1 tablet 3 x day as directed for depression.  Do not take more than 5 days per week 90 tablet 0  . Omega-3 Fatty Acids (FISH OIL) 1000 MG CAPS Take 1 capsule (1,000 mg total) by mouth 3 (three) times daily. (Patient taking differently: Take 1,000 mg by mouth daily. )  0  . pantoprazole (PROTONIX) 40 MG tablet Take 1 tablet (40 mg total) by mouth daily. 90 tablet 3  . potassium chloride SA (K-DUR,KLOR-CON) 20 MEQ tablet Take 1 tablet (20 mEq total) by mouth 3 (three) times daily. 270 tablet 0  . 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 180 tablet 98  . sertraline (ZOLOFT) 100 MG tablet take 2 tablets by mouth once daily at bedtime 180 tablet 1   No current facility-administered medications on file prior to visit.     Allergies: Allergies  Allergen Reactions  . Codeine Other (See Comments)    Abdominal pain  . Erythromycin Base     GI upset  . Penicillins Rash    Current Problems (verified) has Essential hypertension; Hyperlipidemia; Depression, controlled; Vitamin D deficiency; Prediabetes; Hypothyroidism; Medication management; GERD ; Tobacco use disorder; Encounter for Medicare annual wellness exam; Extrinsic asthma; and Body mass index (BMI) of 23.0-23.9 in adult on her problem list.  Surgical: She  has a past surgical history that includes Cholecystectomy (1975); Nasal sinus surgery (1996); Liposuction (1998); Breast surgery (Bilateral, 1983); Breast reconstruction (2008); Appendectomy; bil breast lumpectomy; bilateral breast masectomy (no breast cancer); and EUS (N/A, 04/23/2014). Family Her family history includes COPD in her mother; Diabetes in her sister; Early death in her father and sister; Emphysema in her mother; Lung cancer in her mother. Social history  She reports that she has been smoking.  She has been smoking  about 0.50 packs per day. She has never used smokeless tobacco. She reports that she drinks alcohol. She reports that she does not use drugs.    Objective:   Today's Vitals   01/11/17 1500  BP: 110/80  Pulse: 76  Resp: 16  Temp: 97.5 F (36.4 C)  SpO2: 95%  Weight: 145 lb (65.8 kg)  Height: 5' 3.75" (1.619 m)   Body mass index is 25.08 kg/m.  General appearance: alert, no distress, WD/WN,  female HEENT: normocephalic, sclerae anicteric, TMs pearly, nares patent, no discharge or erythema, pharynx normal Oral cavity: MMM, no lesions Neck: supple, no lymphadenopathy, no thyromegaly, no masses Heart: RRR, normal S1, S2, no murmurs Lungs: CTA bilaterally diffuse wheezing, no rhonchi, or  rales Abdomen: +bs, soft, non tender, non distended, no masses, no hepatomegaly, no splenomegaly Musculoskeletal: nontender, no swelling, no obvious deformity Extremities: no edema, no cyanosis, no clubbing Pulses: 2+ symmetric, upper and lower extremities, normal cap refill Neurological: alert, oriented x 3, CN2-12 intact, strength normal upper extremities and lower extremities, sensation normal throughout, DTRs 2+ throughout, no cerebellar signs, gait normal Psychiatric: normal affect, behavior normal, pleasant     Vicie Mutters, PA-C   01/11/2017

## 2017-01-11 ENCOUNTER — Ambulatory Visit (INDEPENDENT_AMBULATORY_CARE_PROVIDER_SITE_OTHER): Payer: Medicare Other | Admitting: Physician Assistant

## 2017-01-11 ENCOUNTER — Encounter: Payer: Self-pay | Admitting: Physician Assistant

## 2017-01-11 ENCOUNTER — Other Ambulatory Visit: Payer: Self-pay | Admitting: Internal Medicine

## 2017-01-11 VITALS — BP 110/80 | HR 76 | Temp 97.5°F | Resp 16 | Ht 63.75 in | Wt 145.0 lb

## 2017-01-11 DIAGNOSIS — I10 Essential (primary) hypertension: Secondary | ICD-10-CM

## 2017-01-11 DIAGNOSIS — E079 Disorder of thyroid, unspecified: Secondary | ICD-10-CM

## 2017-01-11 DIAGNOSIS — J45909 Unspecified asthma, uncomplicated: Secondary | ICD-10-CM

## 2017-01-11 DIAGNOSIS — Z79899 Other long term (current) drug therapy: Secondary | ICD-10-CM

## 2017-01-11 DIAGNOSIS — R7303 Prediabetes: Secondary | ICD-10-CM | POA: Diagnosis not present

## 2017-01-11 DIAGNOSIS — E782 Mixed hyperlipidemia: Secondary | ICD-10-CM | POA: Diagnosis not present

## 2017-01-11 DIAGNOSIS — F17200 Nicotine dependence, unspecified, uncomplicated: Secondary | ICD-10-CM

## 2017-01-11 DIAGNOSIS — E559 Vitamin D deficiency, unspecified: Secondary | ICD-10-CM

## 2017-01-11 DIAGNOSIS — J441 Chronic obstructive pulmonary disease with (acute) exacerbation: Secondary | ICD-10-CM

## 2017-01-11 MED ORDER — AZITHROMYCIN 250 MG PO TABS: ORAL_TABLET | ORAL | 1 refills | Status: AC

## 2017-01-11 MED ORDER — ALBUTEROL SULFATE HFA 108 (90 BASE) MCG/ACT IN AERS: 2.0000 | INHALATION_SPRAY | Freq: Four times a day (QID) | RESPIRATORY_TRACT | 2 refills | Status: DC | PRN

## 2017-01-11 MED ORDER — PROMETHAZINE-DM 6.25-15 MG/5ML PO SYRP: 5.0000 mL | ORAL_SOLUTION | Freq: Four times a day (QID) | ORAL | 1 refills | Status: DC | PRN

## 2017-01-11 MED ORDER — IPRATROPIUM-ALBUTEROL 0.5-2.5 (3) MG/3ML IN SOLN
3.0000 mL | Freq: Once | RESPIRATORY_TRACT | Status: AC
  Administered 2017-01-11: 3 mL via RESPIRATORY_TRACT

## 2017-01-11 MED ORDER — CLOTRIMAZOLE-BETAMETHASONE 1-0.05 % EX CREA: TOPICAL_CREAM | CUTANEOUS | 1 refills | Status: DC

## 2017-01-11 MED ORDER — PREDNISONE 20 MG PO TABS: ORAL_TABLET | ORAL | 0 refills | Status: DC

## 2017-01-11 NOTE — Patient Instructions (Signed)
  Here are things you can do to help with this: - Try the Flonase or Nasonex. Remember to spray each nostril twice towards the outer part of your eye.  Do not sniff but instead pinch your nose and tilt your head back to help the medicine get into your sinuses.  The best time to do this is at bedtime.Stop if you get blurred vision or nose bleeds.   -Please pick one of the over the counter allergy medications below and take it once daily for allergies.  It will also help with fluid behind ear drums. Claritin or loratadine cheapest but likely the weakest  Zyrtec or certizine at night because it can make you sleepy The strongest is allegra or fexafinadine  Cheapest at [employer], [employer], [employer]  if worsening HA, changes vision/speech, imbalance, weakness go to the ER      

## 2017-01-22 ENCOUNTER — Encounter: Payer: Self-pay | Admitting: *Deleted

## 2017-01-23 ENCOUNTER — Telehealth: Payer: Self-pay | Admitting: *Deleted

## 2017-01-23 NOTE — Telephone Encounter (Signed)
Called patient to check on her, her boyfriend called yesterday & scheduled a appointment for patient saying pt felt like she was losing it ect & I felt I needed to check on pt.  Per pt shes's fine just tons of things going on with her moving to [place] tons of changes & adjustments per pt said she is ok & no worries

## 2017-01-24 ENCOUNTER — Ambulatory Visit: Payer: Self-pay | Admitting: Internal Medicine

## 2017-01-24 ENCOUNTER — Other Ambulatory Visit: Payer: Self-pay | Admitting: Internal Medicine

## 2017-02-07 ENCOUNTER — Other Ambulatory Visit: Payer: Self-pay | Admitting: Internal Medicine

## 2017-02-11 ENCOUNTER — Other Ambulatory Visit: Payer: Self-pay | Admitting: Internal Medicine

## 2017-02-12 NOTE — Telephone Encounter (Signed)
Xanax was called into pharmacy on 21st May 2018 @ 9:23am by DD

## 2017-02-14 ENCOUNTER — Ambulatory Visit: Payer: Self-pay | Admitting: Internal Medicine

## 2017-02-16 ENCOUNTER — Encounter: Payer: Self-pay | Admitting: Internal Medicine

## 2017-02-20 ENCOUNTER — Ambulatory Visit (INDEPENDENT_AMBULATORY_CARE_PROVIDER_SITE_OTHER): Payer: Medicare Other | Admitting: Internal Medicine

## 2017-02-20 ENCOUNTER — Encounter: Payer: Self-pay | Admitting: Internal Medicine

## 2017-02-20 VITALS — BP 106/72 | HR 80 | Temp 97.1°F | Resp 16 | Ht 63.75 in | Wt 143.8 lb

## 2017-02-20 DIAGNOSIS — F329 Major depressive disorder, single episode, unspecified: Secondary | ICD-10-CM

## 2017-02-20 DIAGNOSIS — F32A Depression, unspecified: Secondary | ICD-10-CM
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
## 2017-03-23 ENCOUNTER — Other Ambulatory Visit: Payer: Self-pay | Admitting: Physician Assistant

## 2017-03-23 ENCOUNTER — Other Ambulatory Visit: Payer: Self-pay

## 2017-03-23 NOTE — Telephone Encounter (Signed)
ENTERED IN ERROR

## 2017-03-23 NOTE — Telephone Encounter (Signed)
Please call Alprazolam 

## 2017-04-24 ENCOUNTER — Ambulatory Visit (INDEPENDENT_AMBULATORY_CARE_PROVIDER_SITE_OTHER): Payer: Medicare Other | Admitting: Internal Medicine

## 2017-04-24 ENCOUNTER — Encounter: Payer: Self-pay | Admitting: Internal Medicine

## 2017-04-24 VITALS — BP 114/72 | HR 92 | Temp 97.4°F | Resp 16 | Ht 63.25 in | Wt 142.8 lb

## 2017-04-24 DIAGNOSIS — E782 Mixed hyperlipidemia: Secondary | ICD-10-CM

## 2017-04-24 DIAGNOSIS — K219 Gastro-esophageal reflux disease without esophagitis: Secondary | ICD-10-CM | POA: Diagnosis not present

## 2017-04-24 DIAGNOSIS — E079 Disorder of thyroid, unspecified: Secondary | ICD-10-CM | POA: Diagnosis not present

## 2017-04-24 DIAGNOSIS — I10 Essential (primary) hypertension: Secondary | ICD-10-CM

## 2017-04-24 DIAGNOSIS — Z79899 Other long term (current) drug therapy: Secondary | ICD-10-CM | POA: Diagnosis not present

## 2017-04-24 DIAGNOSIS — F32A Depression, unspecified: Secondary | ICD-10-CM

## 2017-04-24 DIAGNOSIS — Z136 Encounter for screening for cardiovascular disorders: Secondary | ICD-10-CM | POA: Diagnosis not present

## 2017-04-24 DIAGNOSIS — R7309 Other abnormal glucose: Secondary | ICD-10-CM

## 2017-04-24 DIAGNOSIS — Z1212 Encounter for screening for malignant neoplasm of rectum: Secondary | ICD-10-CM

## 2017-04-24 DIAGNOSIS — R7303 Prediabetes: Secondary | ICD-10-CM

## 2017-04-24 DIAGNOSIS — F329 Major depressive disorder, single episode, unspecified: Secondary | ICD-10-CM | POA: Diagnosis not present

## 2017-04-24 DIAGNOSIS — E559 Vitamin D deficiency, unspecified: Secondary | ICD-10-CM | POA: Diagnosis not present

## 2017-04-24 LAB — CBC WITH DIFFERENTIAL/PLATELET
Basophils Absolute: 105 cells/uL (ref 0–200)
Basophils Relative: 1 %
Eosinophils Absolute: 210 cells/uL (ref 15–500)
Eosinophils Relative: 2 %
HCT: 42.4 % (ref 35.0–45.0)
Hemoglobin: 14 g/dL (ref 11.7–15.5)
Lymphocytes Relative: 30 %
Lymphs Abs: 3150 cells/uL (ref 850–3900)
MCH: 30.8 pg (ref 27.0–33.0)
MCHC: 33 g/dL (ref 32.0–36.0)
MCV: 93.2 fL (ref 80.0–100.0)
MPV: 10.7 fL (ref 7.5–12.5)
Monocytes Absolute: 630 cells/uL (ref 200–950)
Monocytes Relative: 6 %
Neutro Abs: 6405 cells/uL (ref 1500–7800)
Neutrophils Relative %: 61 %
Platelets: 328 10*3/uL (ref 140–400)
RBC: 4.55 MIL/uL (ref 3.80–5.10)
RDW: 13.7 % (ref 11.0–15.0)
WBC: 10.5 10*3/uL (ref 3.8–10.8)

## 2017-04-24 NOTE — Patient Instructions (Signed)

## 2017-04-24 NOTE — Progress Notes (Signed)
[facility] Unk Pinto, [name]      [name], [name] [facility]                [address] [place] [address] Telephone [phone] Telefax [phone]   Comprehensive Evaluation &  Examination     [name] presents for a  comprehensive evaluation and management of multiple medical co-morbidities.  Patient has been followed for HTN, Prediabetes, Hyperlipidemia, Hypothyroidism and Vitamin D Deficiency. Her GERD is controlled with her meds. She also has a long premorbid Hx/o Depression predating back many years and is on SS Disability for her Depression . In 2008 she underwent ECT x 9. Patient is followed by Dr Layla Barter and      HTN predates since 2008. Patient's BP has been controlled at home and patient denies any cardiac symptoms as chest pain, palpitations, shortness of breath, dizziness or ankle swelling. Today's BP is at goal - 114/72      Patient's hyperlipidemia is controlled with diet and medications. Patient denies myalgias or other medication SE's. Last lipids were at goal albeit elevated Trig's: Lab Results  Component Value Date   CHOL 180 10/04/2016   HDL 58 10/04/2016   LDLCALC 78 10/04/2016   TRIG 220 (H) 10/04/2016   CHOLHDL 3.1 10/04/2016      Patient has prediabetes (A1c 5.7% in 2012) and patient denies reactive hypoglycemic symptoms, visual blurring, diabetic polys, or paresthesias. Last A1c was at goal: Lab Results  Component Value Date   HGBA1C 5.2 10/04/2016      Finally, patient has history of Vitamin D Deficiency ("36" in 2008) and last Vitamin D was near goal (70-100): Lab Results  Component Value Date   VD25OH 54 10/04/2016   Current Outpatient Prescriptions on File Prior to Visit  Medication Sig  . albuterol (PROVENTIL HFA;VENTOLIN HFA) 108 (90 Base) MCG/ACT inhaler Inhale 2 puffs into the lungs every 6 (six) hours as needed for  wheezing or shortness of breath.  . ALPRAZolam (XANAX) 1 MG tablet take 1 tablet by mouth four times a day  . atorvastatin (LIPITOR) 80 MG tablet Take 1/2 to 1 tablet daily  for Cholesterol -  . BREO ELLIPTA 200-25 MCG/INH AEPB inhale 1 puff INTO THE LUNGS DAILY  . bumetanide (BUMEX) 2 MG tablet take 1 tablet by mouth twice a day  . buPROPion (WELLBUTRIN XL) 300 MG 24 hr tablet take 1 tablet by mouth every morning for MOOD  . Cholecalciferol (VITAMIN D PO) Take 5,000 Units by mouth daily.  . clotrimazole-betamethasone (LOTRISONE) cream apply to affected area twice a day  . Flaxseed, Linseed, (FLAX SEED OIL) 1300 MG CAPS Take 1,300 mg by mouth 3 (three) times daily. (Patient taking differently: Take 1,000 mg by mouth daily. )  . levothyroxine (SYNTHROID, LEVOTHROID) 50 MCG tablet take 1 to 1 1/2 tablets by mouth once daily  . meloxicam (MOBIC) 15 MG tablet take 1 tablet by mouth once daily  . methylphenidate (RITALIN) 20 MG tablet Take 1/2 to 1 tablet 3 x day as directed for depression.  Do not take more than 5 days per week  . Omega-3 Fatty Acids (FISH OIL) 1000 MG CAPS Take 1 capsule (1,000 mg total) by mouth 3 (three) times daily. (Patient taking differently: Take 1,000 mg by mouth daily. )  . pantoprazole (PROTONIX) 40  MG tablet Take 1 tablet (40 mg total) by mouth daily.  . potassium chloride SA (K-DUR,KLOR-CON) 20 MEQ tablet take 1 tablet by mouth three times a day  . 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  Marland Kitchen sertraline (ZOLOFT) 100 MG tablet take 2 tablets by mouth at bedtime   No current facility-administered medications on file prior to visit.    Allergies  Allergen Reactions  . Codeine Other (See Comments)    Abdominal pain  . Erythromycin Base     GI upset  . Penicillins Rash   Past Medical History:  Diagnosis Date  . Allergy   . Anxiety   . Arthritis   . Asthma    as a child,  out grew   . Blood transfusion without reported diagnosis   . Cancer ([place])    basal cell left temple, left side of nose  . Depression   . GERD (gastroesophageal reflux disease)   . Hyperlipidemia   . Hypertension   . Thyroid disease    hypo   Health Maintenance  Topic Date Due  . Hepatitis C Screening  [birth year]  . HIV Screening  12/12/1967  . MAMMOGRAM  03/31/2012  . COLONOSCOPY  04/13/2017  . INFLUENZA VACCINE  04/25/2017  . PAP SMEAR  01/26/2018  . TETANUS/TDAP  11/30/2025   Immunization History  Administered Date(s) Administered  . Influenza Split 06/22/2015  . Influenza,inj,quad, With Preservative 07/05/2016  . Pneumococcal-Unspecified 09/25/2004  . Td 09/25/2004  . Tdap 12/01/2015   Past Surgical History:  Procedure Laterality Date  . APPENDECTOMY    . bil breast lumpectomy    . bilateral breast masectomy  no breast cancer   bil breast removed to prevent ca.  Marland Kitchen BREAST RECONSTRUCTION  2008  . BREAST SURGERY Bilateral 1983   implants  . CHOLECYSTECTOMY  1975  . EUS N/A 04/23/2014   Procedure: LOWER ENDOSCOPIC ULTRASOUND (EUS);  Surgeon: Milus Banister, MD;  Location: [facility];  Service: Endoscopy;  Laterality: N/A;  . LIPOSUCTION  1998   with tummy tuck  . NASAL SINUS SURGERY  1996   Family History  Problem Relation Age of Onset  . COPD Mother   . Lung cancer Mother   . Emphysema Mother   . Early death Father        murdered at 76  . Early death Sister        suicide at 82  . Diabetes Sister   . Colon cancer Neg Hx   . Esophageal cancer Neg Hx   . Pancreatic cancer Neg Hx   . Rectal cancer Neg Hx   . Stomach cancer Neg Hx    Social History  Substance Use Topics  . Smoking status: Current Every Day Smoker    Packs/day: 0.50  . Smokeless tobacco: Never Used  . Alcohol use Yes     Comment: occasional    ROS Constitutional: Denies fever, chills, weight loss/gain, headaches, insomnia,  night sweats, and change in appetite. Does c/o  fatigue. Eyes: Denies redness, blurred vision, diplopia, discharge, itchy, watery eyes.  ENT: Denies discharge, congestion, post nasal drip, epistaxis, sore throat, earache, hearing loss, dental pain, Tinnitus, Vertigo, Sinus pain, snoring.  Cardio: Denies chest pain, palpitations, irregular heartbeat, syncope, dyspnea, diaphoresis, orthopnea, PND, claudication, edema Respiratory: denies cough, dyspnea, DOE, pleurisy, hoarseness, laryngitis, wheezing.  Gastrointestinal: Denies dysphagia, heartburn, reflux, water brash, pain, cramps, nausea, vomiting,  bloating, diarrhea, constipation, hematemesis, melena, hematochezia, jaundice, hemorrhoids Genitourinary: Denies dysuria, frequency, urgency, nocturia, hesitancy, discharge, hematuria, flank pain Breast: Breast lumps, nipple discharge, bleeding.  Musculoskeletal: Denies arthralgia, myalgia, stiffness, Jt. Swelling, pain, limp, and strain/sprain. Denies falls. Skin: Denies puritis, rash, hives, warts, acne, eczema, changing in skin lesion Neuro: No weakness, tremor, incoordination, spasms, paresthesia, pain Psychiatric: Denies confusion, memory loss, sensory loss. Denies Depression. Endocrine: Denies change in weight, skin, hair change, nocturia, and paresthesia, diabetic polys, visual blurring, hyper / hypo glycemic episodes.  Heme/Lymph: No excessive bleeding, bruising, enlarged lymph nodes.  Physical Exam  BP 114/72   Pulse 92   Temp (!) 97.4 F (36.3 C)   Resp 16   Ht 5' 3.25" (1.607 m)   Wt 142 lb 12.8 oz (64.8 kg)   BMI 25.10 kg/m   General Appearance: Well nourished, well groomed and in no apparent distress.  Eyes: PERRLA, EOMs, conjunctiva no swelling or erythema, normal fundi and vessels. Sinuses: No frontal/maxillary tenderness ENT/Mouth: EACs patent / TMs  nl. Nares clear without erythema, swelling, mucoid exudates. Oral hygiene is good. No erythema, swelling, or exudate. Tongue normal, non-obstructing. Tonsils not swollen or  erythematous. Hearing normal.  Neck: Supple, thyroid normal. No bruits, nodes or JVD. Respiratory: Respiratory effort normal.  BS equal and clear bilateral without rales, rhonci, wheezing or stridor. Cardio: Heart sounds are normal with regular rate and rhythm and no murmurs, rubs or gallops. Peripheral pulses are normal and equal bilaterally without edema. No aortic or femoral bruits. Chest: symmetric with normal excursions and percussion. Breasts: Symmetric, without lumps, nipple discharge, retractions, or fibrocystic changes.  Abdomen: Flat, soft with bowel sounds active. Nontender, no guarding, rebound, hernias, masses, or organomegaly.  Lymphatics: Non tender without lymphadenopathy.  Genitourinary:  Musculoskeletal: Full ROM all peripheral extremities, joint stability, 5/5 strength, and normal gait. Skin: Warm and dry without rashes, lesions, cyanosis, clubbing or  ecchymosis.  Neuro: Cranial nerves intact, reflexes equal bilaterally. Normal muscle tone, no cerebellar symptoms. Sensation intact.  Pysch: Alert and oriented X 3, normal affect, Insight and Judgment appropriate.   Assessment and Plan  1. Essential hypertension  - EKG 12-Lead - Urinalysis, Routine w reflex microscopic - Microalbumin / creatinine urine ratio - CBC with Differential/Platelet - BASIC METABOLIC PANEL WITH GFR - Magnesium - TSH  2. Hyperlipidemia, mixed  - EKG 12-Lead - Hepatic function panel - Lipid panel - TSH  3. Prediabetes  - EKG 12-Lead - Hemoglobin A1c - Insulin, random  4. Vitamin D deficiency  - VITAMIN D 25 Hydroxy  5. Other abnormal glucose  - Hemoglobin A1c - Insulin, random  6. Hypothyroidism  - TSH  7. Gastroesophageal reflux disease   8. Depression, controlled   9. Screening for rectal cancer  - POC Hemoccult Bld/Stl   10. Screening for ischemic heart disease  - EKG 12-Lead - Lipid panel  11. Medication management  - Urinalysis, Routine w reflex  microscopic - Microalbumin / creatinine urine ratio - CBC with Differential/Platelet - BASIC METABOLIC PANEL WITH GFR - Hepatic function panel - Magnesium - Lipid panel - TSH - Hemoglobin A1c - Insulin, random - VITAMIN D 25 Hydroxy       Patient was counseled in prudent diet to achieve/maintain BMI less than 25 for weight control, BP monitoring, regular exercise and medications. Discussed med's effects and SE's. Screening labs and tests as requested with regular follow-up as recommended. Over 40 minutes of exam, counseling, chart review and high complex critical decision making was performed.

## 2017-04-25 ENCOUNTER — Other Ambulatory Visit: Payer: Self-pay | Admitting: *Deleted

## 2017-04-25 DIAGNOSIS — K219 Gastro-esophageal reflux disease without esophagitis: Secondary | ICD-10-CM

## 2017-04-25 LAB — HEPATIC FUNCTION PANEL
ALT: 14 U/L (ref 6–29)
AST: 16 U/L (ref 10–35)
Albumin: 4.4 g/dL (ref 3.6–5.1)
Alkaline Phosphatase: 93 U/L (ref 33–130)
Bilirubin, Direct: 0.1 mg/dL (ref <=0.2)
Indirect Bilirubin: 0.3 mg/dL (ref 0.2–1.2)
Total Bilirubin: 0.4 mg/dL (ref 0.2–1.2)
Total Protein: 6.8 g/dL (ref 6.1–8.1)

## 2017-04-25 LAB — BASIC METABOLIC PANEL WITH GFR
BUN: 16 mg/dL (ref 7–25)
CO2: 26 mmol/L (ref 20–31)
Calcium: 9.3 mg/dL (ref 8.6–10.4)
Chloride: 98 mmol/L (ref 98–110)
Creat: 0.72 mg/dL (ref 0.50–0.99)
GFR, Est African American: >89 mL/min (ref >=60)
GFR, Est Non African American: 89 mL/min (ref >=60)
Glucose, Bld: 103 mg/dL — ABNORMAL HIGH (ref 65–99)
Potassium: 3.2 mmol/L — ABNORMAL LOW (ref 3.5–5.3)
Sodium: 141 mmol/L (ref 135–146)

## 2017-04-25 LAB — URINALYSIS, ROUTINE W REFLEX MICROSCOPIC
Bilirubin Urine: NEGATIVE
Glucose, UA: NEGATIVE
Hgb urine dipstick: NEGATIVE
Ketones, ur: NEGATIVE
Leukocytes, UA: NEGATIVE
Nitrite: NEGATIVE
Protein, ur: NEGATIVE
Specific Gravity, Urine: 1.016 (ref 1.001–1.035)
pH: 6 (ref 5.0–8.0)

## 2017-04-25 LAB — LIPID PANEL
Cholesterol: 216 mg/dL — ABNORMAL HIGH (ref <200)
HDL: 60 mg/dL (ref >50)
LDL Cholesterol: 113 mg/dL — ABNORMAL HIGH (ref <100)
Total CHOL/HDL Ratio: 3.6 Ratio (ref <5.0)
Triglycerides: 214 mg/dL — ABNORMAL HIGH (ref <150)
VLDL: 43 mg/dL — ABNORMAL HIGH (ref <30)

## 2017-04-25 LAB — MAGNESIUM: Magnesium: 1.6 mg/dL (ref 1.5–2.5)

## 2017-04-25 LAB — MICROALBUMIN / CREATININE URINE RATIO
Creatinine, Urine: 132 mg/dL (ref 20–320)
Microalb Creat Ratio: 11 mcg/mg creat (ref <30)
Microalb, Ur: 1.5 mg/dL

## 2017-04-25 LAB — HEMOGLOBIN A1C
Hgb A1c MFr Bld: 5.5 % (ref <5.7)
Mean Plasma Glucose: 111 mg/dL

## 2017-04-25 LAB — VITAMIN D 25 HYDROXY (VIT D DEFICIENCY, FRACTURES): Vit D, 25-Hydroxy: 60 ng/mL (ref 30–100)

## 2017-04-25 LAB — TSH: TSH: 3.02 mIU/L

## 2017-04-25 LAB — INSULIN, RANDOM: Insulin: 13.2 u[IU]/mL (ref 2.0–19.6)

## 2017-04-25 MED ORDER — RANITIDINE HCL 300 MG PO TABS: ORAL_TABLET | ORAL | 3 refills | Status: DC

## 2017-04-26 ENCOUNTER — Other Ambulatory Visit: Payer: Self-pay | Admitting: Internal Medicine

## 2017-04-26 DIAGNOSIS — R609 Edema, unspecified: Secondary | ICD-10-CM

## 2017-04-26 DIAGNOSIS — I10 Essential (primary) hypertension: Secondary | ICD-10-CM

## 2017-05-21 DIAGNOSIS — B0089 Other herpesviral infection: Secondary | ICD-10-CM | POA: Diagnosis not present

## 2017-05-21 DIAGNOSIS — L680 Hirsutism: Secondary | ICD-10-CM | POA: Diagnosis not present

## 2017-05-21 DIAGNOSIS — X32XXXD Exposure to sunlight, subsequent encounter: Secondary | ICD-10-CM | POA: Diagnosis not present

## 2017-05-21 DIAGNOSIS — L570 Actinic keratosis: Secondary | ICD-10-CM | POA: Diagnosis not present

## 2017-06-03 ENCOUNTER — Other Ambulatory Visit: Payer: Self-pay | Admitting: Internal Medicine

## 2017-06-03 NOTE — Telephone Encounter (Signed)
Please call Alprazolam 

## 2017-06-22 ENCOUNTER — Other Ambulatory Visit: Payer: Self-pay | Admitting: *Deleted

## 2017-06-22 MED ORDER — POTASSIUM CHLORIDE CRYS ER 20 MEQ PO TBCR
20.0000 meq | EXTENDED_RELEASE_TABLET | Freq: Three times a day (TID) | ORAL | 1 refills | Status: DC
Start: 2017-06-22 — End: 2017-06-26

## 2017-06-26 ENCOUNTER — Other Ambulatory Visit: Payer: Self-pay | Admitting: *Deleted

## 2017-06-26 MED ORDER — POTASSIUM CHLORIDE CRYS ER 20 MEQ PO TBCR: 20.0000 meq | EXTENDED_RELEASE_TABLET | Freq: Three times a day (TID) | ORAL | 1 refills | Status: DC

## 2017-07-04 ENCOUNTER — Other Ambulatory Visit: Payer: Self-pay | Admitting: *Deleted

## 2017-07-04 MED ORDER — LEVOTHYROXINE SODIUM 50 MCG PO TABS: ORAL_TABLET | ORAL | 0 refills | Status: DC

## 2017-07-22 DIAGNOSIS — Z23 Encounter for immunization: Secondary | ICD-10-CM | POA: Diagnosis not present

## 2017-07-24 ENCOUNTER — Ambulatory Visit (INDEPENDENT_AMBULATORY_CARE_PROVIDER_SITE_OTHER): Payer: Medicare Other | Admitting: Internal Medicine

## 2017-07-24 ENCOUNTER — Encounter: Payer: Self-pay | Admitting: Internal Medicine

## 2017-07-24 VITALS — BP 126/82 | HR 84 | Temp 97.3°F | Resp 16 | Ht 63.25 in | Wt 141.4 lb

## 2017-07-24 DIAGNOSIS — R7303 Prediabetes: Secondary | ICD-10-CM | POA: Diagnosis not present

## 2017-07-24 DIAGNOSIS — E559 Vitamin D deficiency, unspecified: Secondary | ICD-10-CM

## 2017-07-24 DIAGNOSIS — F909 Attention-deficit hyperactivity disorder, unspecified type: Secondary | ICD-10-CM | POA: Diagnosis not present

## 2017-07-24 DIAGNOSIS — E079 Disorder of thyroid, unspecified: Secondary | ICD-10-CM | POA: Diagnosis not present

## 2017-07-24 DIAGNOSIS — I10 Essential (primary) hypertension: Secondary | ICD-10-CM

## 2017-07-24 DIAGNOSIS — E782 Mixed hyperlipidemia: Secondary | ICD-10-CM

## 2017-07-24 DIAGNOSIS — F3341 Major depressive disorder, recurrent, in partial remission: Secondary | ICD-10-CM | POA: Diagnosis not present

## 2017-07-24 DIAGNOSIS — K219 Gastro-esophageal reflux disease without esophagitis: Secondary | ICD-10-CM

## 2017-07-24 DIAGNOSIS — Z79899 Other long term (current) drug therapy: Secondary | ICD-10-CM | POA: Diagnosis not present

## 2017-07-24 MED ORDER — METHYLPHENIDATE HCL 20 MG PO TABS: ORAL_TABLET | ORAL | 0 refills | Status: DC

## 2017-07-24 MED ORDER — FLUOXETINE HCL 40 MG PO CAPS: ORAL_CAPSULE | ORAL | 1 refills | Status: DC

## 2017-07-24 NOTE — Patient Instructions (Addendum)
Food Choices for Gastroesophageal Reflux Disease, Adult When you have gastroesophageal reflux disease (GERD), the foods you eat and your eating habits are very important. Choosing the right foods can help ease the discomfort of GERD. Consider working with a diet and nutrition specialist (dietitian) to help you make healthy food choices. What general guidelines should I follow? Eating plan  Choose healthy foods low in fat, such as fruits, vegetables, whole grains, low-fat dairy products, and lean meat, fish, and poultry.  Eat frequent, small meals instead of three large meals each day. Eat your meals slowly, in a relaxed setting. Avoid bending over or lying down until 2-3 hours after eating.  Limit high-fat foods such as fatty meats or fried foods.  Limit your intake of oils, butter, and shortening to less than 8 teaspoons each day.  Avoid the following: ? Foods that cause symptoms. These may be different for different people. Keep a food diary to keep track of foods that cause symptoms. ? Alcohol. ? Drinking large amounts of liquid with meals. ? Eating meals during the 2-3 hours before bed.  Cook foods using methods other than frying. This may include baking, grilling, or broiling. Lifestyle   Maintain a healthy weight. Ask your health care provider what weight is healthy for you. If you need to lose weight, work with your health care provider to do so safely.  Exercise for at least 30 minutes on 5 or more days each week, or as told by your health care provider.  Avoid wearing clothes that fit tightly around your waist and chest.  Do not use any products that contain nicotine or tobacco, such as cigarettes and e-cigarettes. If you need help quitting, ask your health care provider.  Sleep with the head of your bed raised. Use a wedge under the mattress or blocks under the bed frame to raise the head of the bed. What foods are not recommended? The items listed may not be a complete  list. Talk with your dietitian about what dietary choices are best for you. Grains Pastries or quick breads with added fat. French toast. Vegetables Deep fried vegetables. French fries. Any vegetables prepared with added fat. Any vegetables that cause symptoms. For some people this may include tomatoes and tomato products, chili peppers, onions and garlic, and horseradish. Fruits Any fruits prepared with added fat. Any fruits that cause symptoms. For some people this may include citrus fruits, such as oranges, grapefruit, pineapple, and lemons. Meats and other protein foods High-fat meats, such as fatty beef or pork, hot dogs, ribs, ham, sausage, salami and bacon. Fried meat or protein, including fried fish and fried chicken. Nuts and nut butters. Dairy Whole milk and chocolate milk. Sour cream. Cream. Ice cream. Cream cheese. Milk shakes. Beverages Coffee and tea, with or without caffeine. Carbonated beverages. Sodas. Energy drinks. Fruit juice made with acidic fruits (such as orange or grapefruit). Tomato juice. Alcoholic drinks. Fats and oils Butter. Margarine. Shortening. Ghee. Sweets and desserts Chocolate and cocoa. Donuts. Seasoning and other foods Pepper. Peppermint and spearmint. Any condiments, herbs, or seasonings that cause symptoms. For some people, this may include curry, hot sauce, or vinegar-based salad dressings. Summary  When you have gastroesophageal reflux disease (GERD), food and lifestyle choices are very important to help ease the discomfort of GERD.  Eat frequent, small meals instead of three large meals each day. Eat your meals slowly, in a relaxed setting. Avoid bending over or lying down until 2-3 hours after eating.  Limit high-fat   foods such as fatty meat or fried foods. This information is not intended to replace advice given to you by your health care provider. Make sure you discuss any questions you have with your health care provider. Document Released:  09/11/2005 Document Revised: 09/12/2016 Document Reviewed: 09/12/2016 Elsevier Interactive Patient Education  2017 [place].  ++++++++++++++++++++++++++++++ Recommend Adult Low Dose Aspirin or  coated  Aspirin 81 mg daily  To reduce risk of Colon Cancer 20 %,  Skin Cancer 26 % ,  Melanoma 46%  and  Pancreatic cancer 60% +++++++++++++++++++++++++ Vitamin D goal  is between 70-100.  Please make sure that you are taking your Vitamin D as directed.  It is very important as a natural anti-inflammatory  helping hair, skin, and nails, as well as reducing stroke and heart attack risk.  It helps your bones and helps with mood. It also decreases numerous cancer risks so please take it as directed.  Low Vit D is associated with a 200-300% higher risk for CANCER  and 200-300% higher risk for HEART   ATTACK  &  STROKE.   .....................................Marland Kitchen It is also associated with higher death rate at younger ages,  autoimmune diseases like Rheumatoid arthritis, Lupus, Multiple Sclerosis.    Also many other serious conditions, like depression, Alzheimer's Dementia, infertility, muscle aches, fatigue, fibromyalgia - just to name a few. ++++++++++++++++++++ Recommend the book "The END of DIETING" by Dr Excell Seltzer  & the book "The END of DIABETES " by Dr Excell Seltzer At [email or web address] - get book & Audio CD's    Being diabetic has a  300% increased risk for heart attack, stroke, cancer, and alzheimer- type vascular dementia. It is very important that you work harder with diet by avoiding all foods that are white. Avoid white rice (brown & wild rice is OK), white potatoes (sweetpotatoes in moderation is OK), White bread or wheat bread or anything made out of white flour like bagels, donuts, rolls, buns, biscuits, cakes, pastries, cookies, pizza crust, and pasta (made from white flour & egg whites) - vegetarian pasta or spinach or wheat pasta is OK. Multigrain breads like Arnold's or [employer], or multigrain sandwich thins or flatbreads.  Diet, exercise and weight loss can reverse and cure diabetes in the early stages.  Diet, exercise and weight loss is very important in the control and prevention of complications of diabetes which affects every system in your body, ie. Brain - dementia/stroke, eyes - glaucoma/blindness, heart - heart attack/heart failure, kidneys - dialysis, stomach - gastric paralysis, intestines - malabsorption, nerves - severe painful neuritis, circulation - gangrene & loss of a leg(s), and finally cancer and Alzheimers.    I recommend avoid fried & greasy foods,  sweets/candy, white rice (brown or wild rice or Quinoa is OK), white potatoes (sweet potatoes are OK) - anything made from white flour - bagels, doughnuts, rolls, buns, biscuits,white and wheat breads, pizza crust and traditional pasta made of white flour & egg white(vegetarian pasta or spinach or wheat pasta is OK).  Multi-grain bread is OK - like multi-grain flat bread or sandwich thins. Avoid alcohol in excess. Exercise is also important.    Eat all the vegetables you want - avoid meat, especially red meat and dairy - especially cheese.  Cheese is the most concentrated form of trans-fats which is the worst thing to clog up our arteries. Veggie cheese is OK which can be found in the fresh produce section at [facility] or [employer] or [employer]  +++++++++++++++++++++  DASH Eating Plan  DASH stands for "Dietary Approaches to Stop Hypertension."   The DASH eating plan is a healthy eating plan that has been shown to reduce high blood pressure (hypertension). Additional health benefits may include reducing the risk of type 2 diabetes mellitus, heart disease, and stroke. The DASH eating plan may also help with weight loss. WHAT DO I NEED TO KNOW ABOUT THE DASH EATING PLAN? For the DASH eating plan, you will follow these general guidelines:  Choose foods with a percent daily value for sodium of less than  5% (as listed on the food label).  Use salt-free seasonings or herbs instead of table salt or sea salt.  Check with your health care provider or pharmacist before using salt substitutes.  Eat lower-sodium products, often labeled as "lower sodium" or "no salt added."  Eat fresh foods.  Eat more vegetables, fruits, and low-fat dairy products.  Choose whole grains. Look for the word "whole" as the first word in the ingredient list.  Choose fish   Limit sweets, desserts, sugars, and sugary drinks.  Choose heart-healthy fats.  Eat veggie cheese   Eat more home-cooked food and less restaurant, buffet, and fast food.  Limit fried foods.  Cook foods using methods other than frying.  Limit canned vegetables. If you do use them, rinse them well to decrease the sodium.  When eating at a restaurant, ask that your food be prepared with less salt, or no salt if possible.                      WHAT FOODS CAN I EAT? Read Dr Fara Olden Fuhrman's books on The End of Dieting & The End of Diabetes  Grains Whole grain or whole wheat bread. Brown rice. Whole grain or whole wheat pasta. Quinoa, bulgur, and whole grain cereals. Low-sodium cereals. Corn or whole wheat flour tortillas. Whole grain cornbread. Whole grain crackers. Low-sodium crackers.  Vegetables Fresh or frozen vegetables (raw, steamed, roasted, or grilled). Low-sodium or reduced-sodium tomato and vegetable juices. Low-sodium or reduced-sodium tomato sauce and paste. Low-sodium or reduced-sodium canned vegetables.   Fruits All fresh, canned (in natural juice), or frozen fruits.  Protein Products  All fish and seafood.  Dried beans, peas, or lentils. Unsalted nuts and seeds. Unsalted canned beans.  Dairy Low-fat dairy products, such as skim or 1% milk, 2% or reduced-fat cheeses, low-fat ricotta or cottage cheese, or plain low-fat yogurt. Low-sodium or reduced-sodium cheeses.  Fats and Oils Tub margarines without trans fats. Light or  reduced-fat mayonnaise and salad dressings (reduced sodium). Avocado. Safflower, olive, or canola oils. Natural peanut or almond butter.  Other Unsalted popcorn and pretzels. The items listed above may not be a complete list of recommended foods or beverages. Contact your dietitian for more options.  +++++++++++++++  WHAT FOODS ARE NOT RECOMMENDED? Grains/ White flour or wheat flour White bread. White pasta. White rice. Refined cornbread. Bagels and croissants. Crackers that contain trans fat.  Vegetables  Creamed or fried vegetables. Vegetables in a . Regular canned vegetables. Regular canned tomato sauce and paste. Regular tomato and vegetable juices.  Fruits Dried fruits. Canned fruit in light or heavy syrup. Fruit juice.  Meat and Other Protein Products Meat in general - RED meat & White meat.  Fatty cuts of meat. Ribs, chicken wings, all processed meats as bacon, sausage, bologna, salami, fatback, hot dogs, bratwurst and packaged luncheon meats.  Dairy Whole or 2% milk, cream, half-and-half, and cream cheese. Whole-fat or  sweetened yogurt. Full-fat cheeses or blue cheese. Non-dairy creamers and whipped toppings. Processed cheese, cheese spreads, or cheese curds.  Condiments Onion and garlic salt, seasoned salt, table salt, and sea salt. Canned and packaged gravies. Worcestershire sauce. Tartar sauce. Barbecue sauce. Teriyaki sauce. Soy sauce, including reduced sodium. Steak sauce. Fish sauce. Oyster sauce. Cocktail sauce. Horseradish. Ketchup and mustard. Meat flavorings and tenderizers. Bouillon cubes. Hot sauce. Tabasco sauce. Marinades. Taco seasonings. Relishes.  Fats and Oils Butter, stick margarine, lard, shortening and bacon fat. Coconut, palm kernel, or palm oils. Regular salad dressings.  Pickles and olives. Salted popcorn and pretzels.  The items listed above may not be a complete list of foods and beverages to avoid.  Recommend Adult Low Dose Aspirin or  coated   Aspirin 81 mg daily  To reduce risk of Colon Cancer 20 %,  Skin Cancer 26 % ,  Melanoma 46%  and  Pancreatic cancer 60% +++++++++++++++++++++++++ Vitamin D goal  is between 70-100.  Please make sure that you are taking your Vitamin D as directed.  It is very important as a natural anti-inflammatory  helping hair, skin, and nails, as well as reducing stroke and heart attack risk.  It helps your bones and helps with mood. It also decreases numerous cancer risks so please take it as directed.  Low Vit D is associated with a 200-300% higher risk for CANCER  and 200-300% higher risk for HEART   ATTACK  &  STROKE.   .....................................Marland Kitchen It is also associated with higher death rate at younger ages,  autoimmune diseases like Rheumatoid arthritis, Lupus, Multiple Sclerosis.    Also many other serious conditions, like depression, Alzheimer's Dementia, infertility, muscle aches, fatigue, fibromyalgia - just to name a few. ++++++++++++++++++++ Recommend the book "The END of DIETING" by Dr Excell Seltzer  & the book "The END of DIABETES " by Dr Excell Seltzer At [email or web address] - get book & Audio CD's    Being diabetic has a  300% increased risk for heart attack, stroke, cancer, and alzheimer- type vascular dementia. It is very important that you work harder with diet by avoiding all foods that are white. Avoid white rice (brown & wild rice is OK), white potatoes (sweetpotatoes in moderation is OK), White bread or wheat bread or anything made out of white flour like bagels, donuts, rolls, buns, biscuits, cakes, pastries, cookies, pizza crust, and pasta (made from white flour & egg whites) - vegetarian pasta or spinach or wheat pasta is OK. Multigrain breads like Arnold's or [employer], or multigrain sandwich thins or flatbreads.  Diet, exercise and weight loss can reverse and cure diabetes in the early stages.  Diet, exercise and weight loss is very important in the control and prevention  of complications of diabetes which affects every system in your body, ie. Brain - dementia/stroke, eyes - glaucoma/blindness, heart - heart attack/heart failure, kidneys - dialysis, stomach - gastric paralysis, intestines - malabsorption, nerves - severe painful neuritis, circulation - gangrene & loss of a leg(s), and finally cancer and Alzheimers.    I recommend avoid fried & greasy foods,  sweets/candy, white rice (brown or wild rice or Quinoa is OK), white potatoes (sweet potatoes are OK) - anything made from white flour - bagels, doughnuts, rolls, buns, biscuits,white and wheat breads, pizza crust and traditional pasta made of white flour & egg white(vegetarian pasta or spinach or wheat pasta is OK).  Multi-grain bread is OK - like multi-grain flat bread or sandwich thins. Avoid alcohol  in excess. Exercise is also important.    Eat all the vegetables you want - avoid meat, especially red meat and dairy - especially cheese.  Cheese is the most concentrated form of trans-fats which is the worst thing to clog up our arteries. Veggie cheese is OK which can be found in the fresh produce section at [employer] or [employer] or [employer]  +++++++++++++++++++++ DASH Eating Plan  DASH stands for "Dietary Approaches to Stop Hypertension."   The DASH eating plan is a healthy eating plan that has been shown to reduce high blood pressure (hypertension). Additional health benefits may include reducing the risk of type 2 diabetes mellitus, heart disease, and stroke. The DASH eating plan may also help with weight loss. WHAT DO I NEED TO KNOW ABOUT THE DASH EATING PLAN? For the DASH eating plan, you will follow these general guidelines:  Choose foods with a percent daily value for sodium of less than 5% (as listed on the food label).  Use salt-free seasonings or herbs instead of table salt or sea salt.  Check with your health care provider or pharmacist before using salt substitutes.  Eat lower-sodium  products, often labeled as "lower sodium" or "no salt added."  Eat fresh foods.  Eat more vegetables, fruits, and low-fat dairy products.  Choose whole grains. Look for the word "whole" as the first word in the ingredient list.  Choose fish   Limit sweets, desserts, sugars, and sugary drinks.  Choose heart-healthy fats.  Eat veggie cheese   Eat more home-cooked food and less restaurant, buffet, and fast food.  Limit fried foods.  Cook foods using methods other than frying.  Limit canned vegetables. If you do use them, rinse them well to decrease the sodium.  When eating at a restaurant, ask that your food be prepared with less salt, or no salt if possible.                      WHAT FOODS CAN I EAT? Read Dr Fara Olden Fuhrman's books on The End of Dieting & The End of Diabetes  Grains Whole grain or whole wheat bread. Brown rice. Whole grain or whole wheat pasta. Quinoa, bulgur, and whole grain cereals. Low-sodium cereals. Corn or whole wheat flour tortillas. Whole grain cornbread. Whole grain crackers. Low-sodium crackers.  Vegetables Fresh or frozen vegetables (raw, steamed, roasted, or grilled). Low-sodium or reduced-sodium tomato and vegetable juices. Low-sodium or reduced-sodium tomato sauce and paste. Low-sodium or reduced-sodium canned vegetables.   Fruits All fresh, canned (in natural juice), or frozen fruits.  Protein Products  All fish and seafood.  Dried beans, peas, or lentils. Unsalted nuts and seeds. Unsalted canned beans.  Dairy Low-fat dairy products, such as skim or 1% milk, 2% or reduced-fat cheeses, low-fat ricotta or cottage cheese, or plain low-fat yogurt. Low-sodium or reduced-sodium cheeses.  Fats and Oils Tub margarines without trans fats. Light or reduced-fat mayonnaise and salad dressings (reduced sodium). Avocado. Safflower, olive, or canola oils. Natural peanut or almond butter.  Other Unsalted popcorn and pretzels. The items listed above may not  be a complete list of recommended foods or beverages. Contact your dietitian for more options.  +++++++++++++++  WHAT FOODS ARE NOT RECOMMENDED? Grains/ White flour or wheat flour White bread. White pasta. White rice. Refined cornbread. Bagels and croissants. Crackers that contain trans fat.  Vegetables  Creamed or fried vegetables. Vegetables in a . Regular canned vegetables. Regular canned tomato sauce and paste. Regular tomato and  vegetable juices.  Fruits Dried fruits. Canned fruit in light or heavy syrup. Fruit juice.  Meat and Other Protein Products Meat in general - RED meat & White meat.  Fatty cuts of meat. Ribs, chicken wings, all processed meats as bacon, sausage, bologna, salami, fatback, hot dogs, bratwurst and packaged luncheon meats.  Dairy Whole or 2% milk, cream, half-and-half, and cream cheese. Whole-fat or sweetened yogurt. Full-fat cheeses or blue cheese. Non-dairy creamers and whipped toppings. Processed cheese, cheese spreads, or cheese curds.  Condiments Onion and garlic salt, seasoned salt, table salt, and sea salt. Canned and packaged gravies. Worcestershire sauce. Tartar sauce. Barbecue sauce. Teriyaki sauce. Soy sauce, including reduced sodium. Steak sauce. Fish sauce. Oyster sauce. Cocktail sauce. Horseradish. Ketchup and mustard. Meat flavorings and tenderizers. Bouillon cubes. Hot sauce. Tabasco sauce. Marinades. Taco seasonings. Relishes.  Fats and Oils Butter, stick margarine, lard, shortening and bacon fat. Coconut, palm kernel, or palm oils. Regular salad dressings.  Pickles and olives. Salted popcorn and pretzels.  The items listed above may not be a complete list of foods and beverages to avoid.

## 2017-07-24 NOTE — Progress Notes (Signed)
[name] presents for 3 month follow up with Hypertension, Hyperlipidemia, Pre-Diabetes and Vitamin D Deficiency. Patient has long hx/o Depression and has been on SS Disability circa 1990's.  In 2008 underwent ECT x 9 and has been followed by Dr Toy Care. Currently she is living with her son & daughter in law in [place] and has stopped her Wellbutrin/Zoloft and has been taking and old Rx of Prozac and desires to continue on the Prozac.  She does continue her Ritalin 5-6 days/week. Patient also has GERD controlled on  Her current meds.      Patient is treated for HTN & BP has been controlled at home. Today's BP is at goal - 126/82. Patient has had no complaints of any cardiac type chest pain, palpitations, dyspnea / orthopnea / PND, dizziness, claudication, or dependent edema.     Hyperlipidemia is controlled with diet & meds. Patient denies myalgias or other med SE's. Last Lipids were  Lab Results  Component Value Date   CHOL 216 (H) 04/24/2017   HDL 60 04/24/2017   LDLCALC 113 (H) 04/24/2017   TRIG 214 (H) 04/24/2017   CHOLHDL 3.6 04/24/2017      Also, the patient has history of PreDiabetes (A1c 5.7%  / 2012)  and has had no symptoms of reactive hypoglycemia, diabetic polys, paresthesias or visual blurring.  Last A1c was  Lab Results  Component Value Date   HGBA1C 5.5 04/24/2017      Further, the patient also has history of Vitamin D Deficiency ("36" / 2008) and supplements vitamin D without any suspected side-effects. Last vitamin D was   Lab Results  Component Value Date   VD25OH 60 04/24/2017   Current Outpatient Prescriptions on File Prior to Visit  Medication Sig  . albuterol (PROVENTIL HFA;VENTOLIN HFA) 108 (90 Base) MCG/ACT inhaler Inhale 2 puffs into the lungs every 6 (six) hours as needed for wheezing or shortness of breath.  . ALPRAZolam (XANAX) 1 MG tablet Take 1/2 to 1 tablet 3 to 4 x / day only if need for severe anxiety  . atorvastatin (LIPITOR) 80 MG tablet  take 1/2 to 1 tablets by mouth once daily if needed for cholesterol  . BREO ELLIPTA 200-25 MCG/INH AEPB inhale 1 puff INTO THE LUNGS DAILY  . bumetanide (BUMEX) 2 MG tablet take 1 tablet by mouth twice a day  . Cholecalciferol (VITAMIN D PO) Take 5,000 Units by mouth daily.  . Flaxseed, Linseed, (FLAX SEED OIL) 1300 MG CAPS Take 1,300 mg by mouth 3 (three) times daily. (Patient taking differently: Take 1,000 mg by mouth daily. )  . levothyroxine (SYNTHROID, LEVOTHROID) 50 MCG tablet take 1 to 1 and 1/2 tablets by mouth once daily  . meloxicam (MOBIC) 15 MG tablet take 1 tablet by mouth once daily  . Omega-3 Fatty Acids (FISH OIL) 1000 MG CAPS Take 1 capsule (1,000 mg total) by mouth 3 (three) times daily. (Patient taking differently: Take 1,000 mg by mouth daily. )  . potassium chloride SA (K-DUR,KLOR-CON) 20 MEQ tablet Take 1 tablet (20 mEq total) by mouth 3 (three) times daily.  . 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  Marland Kitchen buPROPion (WELLBUTRIN XL) 300 MG 24 hr tablet take 1 tablet by mouth once daily (Patient not taking: Reported on 07/24/2017)   No current facility-administered medications on file prior to visit.  Allergies  Allergen Reactions  . Codeine Other (See Comments)    Abdominal pain  . Erythromycin Base     GI upset  . Penicillins Rash   PMHx:   Past Medical History:  Diagnosis Date  . Allergy   . Anxiety   . Arthritis   . Asthma    as a child, out grew   . Blood transfusion without reported diagnosis   . Cancer ([place])    basal cell left temple, left side of nose  . Depression   . GERD (gastroesophageal reflux disease)   . Hyperlipidemia   . Hypertension   . Thyroid disease    hypo   Immunization History  Administered Date(s) Administered  . Influenza Split 06/22/2015  . Influenza,inj,quad, With Preservative 07/05/2016  . Influenza-Unspecified 07/22/2017  .  Pneumococcal-Unspecified 09/25/2004  . Td 09/25/2004  . Tdap 12/01/2015   Past Surgical History:  Procedure Laterality Date  . APPENDECTOMY    . bil breast lumpectomy    . bilateral breast masectomy  no breast cancer   bil breast removed to prevent ca.  Marland Kitchen BREAST RECONSTRUCTION  2008  . BREAST SURGERY Bilateral 1983   implants  . CHOLECYSTECTOMY  1975  . EUS N/A 04/23/2014   Procedure: LOWER ENDOSCOPIC ULTRASOUND (EUS);  Surgeon: Milus Banister, MD;  Location: [facility];  Service: Endoscopy;  Laterality: N/A;  . LIPOSUCTION  1998   with tummy tuck  . NASAL SINUS SURGERY  1996   FHx:    Reviewed / unchanged  SHx:    Reviewed / unchanged  Systems Review:  Constitutional: Denies fever, chills, wt changes, headaches, insomnia, fatigue, night sweats, change in appetite. Eyes: Denies redness, blurred vision, diplopia, discharge, itchy, watery eyes.  ENT: Denies discharge, congestion, post nasal drip, epistaxis, sore throat, earache, hearing loss, dental pain, tinnitus, vertigo, sinus pain, snoring.  CV: Denies chest pain, palpitations, irregular heartbeat, syncope, dyspnea, diaphoresis, orthopnea, PND, claudication or edema. Respiratory: denies cough, dyspnea, DOE, pleurisy, hoarseness, laryngitis, wheezing.  Gastrointestinal: Denies dysphagia, odynophagia, heartburn, reflux, water brash, abdominal pain or cramps, nausea, vomiting, bloating, diarrhea, constipation, hematemesis, melena, hematochezia  or hemorrhoids. Genitourinary: Denies dysuria, frequency, urgency, nocturia, hesitancy, discharge, hematuria or flank pain. Musculoskeletal: Denies arthralgias, myalgias, stiffness, jt. swelling, pain, limping or strain/sprain.  Skin: Denies pruritus, rash, hives, warts, acne, eczema or change in skin lesion(s). Neuro: No weakness, tremor, incoordination, spasms, paresthesia or pain. Psychiatric: Denies confusion, memory loss or sensory loss. Endo: Denies change in weight, skin or hair  change.  Heme/Lymph: No excessive bleeding, bruising or enlarged lymph nodes.  Physical Exam  BP 126/82   Pulse 84   Temp (!) 97.3 F (36.3 C)   Resp 16   Ht 5' 3.25" (1.607 m)   Wt 141 lb 6.4 oz (64.1 kg)   BMI 24.85 kg/m   Appears well nourished, well groomed  and in no distress.  Eyes: PERRLA, EOMs, conjunctiva no swelling or erythema. Sinuses: No frontal/maxillary tenderness ENT/Mouth: EAC's clear, TM's nl w/o erythema, bulging. Nares clear w/o erythema, swelling, exudates. Oropharynx clear without erythema or exudates. Oral hygiene is good. Tongue normal, non obstructing. Hearing intact.  Neck: Supple. Thyroid nl. Car 2+/2+ without bruits, nodes or JVD. Chest: Respirations nl with BS clear & equal w/o rales, rhonchi, wheezing or stridor.  Cor: Heart sounds normal w/ regular rate and rhythm without sig. murmurs, gallops, clicks or rubs. Peripheral pulses normal and equal  without edema.  Abdomen: Soft & bowel sounds normal. Non-tender w/o  guarding, rebound, hernias, masses or organomegaly.  Lymphatics: Unremarkable.  Musculoskeletal: Full ROM all peripheral extremities, joint stability, 5/5 strength and normal gait.  Skin: Warm, dry without exposed rashes, lesions or ecchymosis apparent.  Neuro: Cranial nerves intact, reflexes equal bilaterally. Sensory-motor testing grossly intact. Tendon reflexes grossly intact.  Pysch: Alert & oriented x 3.  Insight and judgement nl & appropriate. No ideations.  Assessment and Plan:   1. Essential hypertension  - Continue medication, monitor blood pressure at home.  - Continue DASH diet. Reminder to go to the ER if any CP,  SOB, nausea, dizziness, severe HA, changes vision/speech.  - CBC with Differential/Platelet - BASIC METABOLIC PANEL WITH GFR - Magnesium - TSH  2. Hyperlipidemia, mixed  - Continue diet/meds, exercise,& lifestyle modifications.  - Continue monitor periodic cholesterol/liver & renal functions  - Hepatic  function panel - Lipid panel - TSH  3. Prediabetes  - Continue diet, exercise, lifestyle modifications.  - Monitor appropriate labs.  - Hemoglobin A1c - Insulin, random  4. Vitamin D deficiency  - Continue supplementation.  - VITAMIN D 25 Hydroxy   5. Hypothyroidism  - TSH  6. Gastroesophageal reflux disease  - CBC with Differential/Platelet  7. Attention deficit hyperactivity disorder (ADHD), unspecified ADHD type  - methylphenidate (RITALIN) 20 MG tablet; Take 1/2 to 1 tablet 3 x day as directed for depression.  Do not take more than 5 days per week  Dispense: 90 tablet; Refill: 0  8. Medication management  - CBC with Differential/Platelet - BASIC METABOLIC PANEL WITH GFR - Hepatic function panel - Magnesium - Lipid panel - TSH - Hemoglobin A1c - Insulin, random - VITAMIN D 25 Hydroxy  9. Recurrent major depressive disorder, in partial remission (HCC) - Rx Prozac 40 mg daily #90 x 1 RF       Discussed  regular exercise, BP monitoring, weight control to achieve/maintain BMI less than 25 and discussed med and SE's. Recommended labs to assess and monitor clinical status with further disposition pending results of labs. Over 30 minutes of exam, counseling, chart review was performed.

## 2017-07-25 LAB — HEMOGLOBIN A1C
Hgb A1c MFr Bld: 5.4 % of total Hgb (ref <5.7)
Mean Plasma Glucose: 108 (calc)
eAG (mmol/L): 6 (calc)

## 2017-07-25 LAB — LIPID PANEL
Cholesterol: 172 mg/dL (ref <200)
HDL: 64 mg/dL (ref >50)
LDL Cholesterol (Calc): 81 mg/dL (calc)
Non-HDL Cholesterol (Calc): 108 mg/dL (calc) (ref <130)
Total CHOL/HDL Ratio: 2.7 (calc) (ref <5.0)
Triglycerides: 174 mg/dL — ABNORMAL HIGH (ref <150)

## 2017-07-25 LAB — HEPATIC FUNCTION PANEL
AG Ratio: 1.7 (calc) (ref 1.0–2.5)
ALT: 14 U/L (ref 6–29)
AST: 15 U/L (ref 10–35)
Albumin: 4.2 g/dL (ref 3.6–5.1)
Alkaline phosphatase (APISO): 86 U/L (ref 33–130)
Bilirubin, Direct: 0.1 mg/dL (ref 0.0–0.2)
Globulin: 2.5 g/dL (calc) (ref 1.9–3.7)
Indirect Bilirubin: 0.3 mg/dL (calc) (ref 0.2–1.2)
Total Bilirubin: 0.4 mg/dL (ref 0.2–1.2)
Total Protein: 6.7 g/dL (ref 6.1–8.1)

## 2017-07-25 LAB — BASIC METABOLIC PANEL WITH GFR
BUN: 20 mg/dL (ref 7–25)
CO2: 31 mmol/L (ref 20–32)
Calcium: 9.6 mg/dL (ref 8.6–10.4)
Chloride: 97 mmol/L — ABNORMAL LOW (ref 98–110)
Creat: 0.79 mg/dL (ref 0.50–0.99)
GFR, Est African American: 92 mL/min/{1.73_m2} (ref >=60)
GFR, Est Non African American: 79 mL/min/{1.73_m2} (ref >=60)
Glucose, Bld: 93 mg/dL (ref 65–99)
Potassium: 3.8 mmol/L (ref 3.5–5.3)
Sodium: 140 mmol/L (ref 135–146)

## 2017-07-25 LAB — CBC WITH DIFFERENTIAL/PLATELET
Basophils Absolute: 62 cells/uL (ref 0–200)
Basophils Relative: 0.8 %
Eosinophils Absolute: 203 cells/uL (ref 15–500)
Eosinophils Relative: 2.6 %
HCT: 38.5 % (ref 35.0–45.0)
Hemoglobin: 13 g/dL (ref 11.7–15.5)
Lymphs Abs: 2878 cells/uL (ref 850–3900)
MCH: 30.7 pg (ref 27.0–33.0)
MCHC: 33.8 g/dL (ref 32.0–36.0)
MCV: 91 fL (ref 80.0–100.0)
MPV: 11 fL (ref 7.5–12.5)
Monocytes Relative: 6.5 %
Neutro Abs: 4150 cells/uL (ref 1500–7800)
Neutrophils Relative %: 53.2 %
Platelets: 290 10*3/uL (ref 140–400)
RBC: 4.23 10*6/uL (ref 3.80–5.10)
RDW: 12.2 % (ref 11.0–15.0)
Total Lymphocyte: 36.9 %
WBC mixed population: 507 cells/uL (ref 200–950)
WBC: 7.8 10*3/uL (ref 3.8–10.8)

## 2017-07-25 LAB — TSH: TSH: 0.69 mIU/L (ref 0.40–4.50)

## 2017-07-25 LAB — INSULIN, RANDOM: Insulin: 2 u[IU]/mL (ref 2.0–19.6)

## 2017-07-25 LAB — VITAMIN D 25 HYDROXY (VIT D DEFICIENCY, FRACTURES): Vit D, 25-Hydroxy: 44 ng/mL (ref 30–100)

## 2017-07-25 LAB — MAGNESIUM: Magnesium: 1.7 mg/dL (ref 1.5–2.5)

## 2017-08-15 ENCOUNTER — Ambulatory Visit: Payer: Self-pay | Admitting: Adult Health

## 2017-08-24 ENCOUNTER — Other Ambulatory Visit: Payer: Self-pay | Admitting: Physician Assistant

## 2017-08-24 DIAGNOSIS — J441 Chronic obstructive pulmonary disease with (acute) exacerbation: Secondary | ICD-10-CM

## 2017-08-31 ENCOUNTER — Telehealth (HOSPITAL_COMMUNITY): Payer: Self-pay | Admitting: Professional

## 2017-09-04 ENCOUNTER — Other Ambulatory Visit (HOSPITAL_COMMUNITY): Payer: Medicare Other

## 2017-09-11 ENCOUNTER — Other Ambulatory Visit (HOSPITAL_COMMUNITY): Payer: Medicare Other | Attending: Psychiatry | Admitting: Licensed Clinical Social Worker

## 2017-09-11 DIAGNOSIS — F332 Major depressive disorder, recurrent severe without psychotic features: Secondary | ICD-10-CM | POA: Insufficient documentation

## 2017-09-11 DIAGNOSIS — F339 Major depressive disorder, recurrent, unspecified: Secondary | ICD-10-CM | POA: Diagnosis present

## 2017-09-12 ENCOUNTER — Telehealth (HOSPITAL_COMMUNITY): Payer: Self-pay | Admitting: Professional

## 2017-09-12 ENCOUNTER — Other Ambulatory Visit: Payer: Self-pay | Admitting: *Deleted

## 2017-09-12 MED ORDER — LEVOTHYROXINE SODIUM 50 MCG PO TABS: ORAL_TABLET | ORAL | 0 refills | Status: DC
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
## 2017-09-13 ENCOUNTER — Other Ambulatory Visit (HOSPITAL_COMMUNITY): Payer: Self-pay

## 2017-09-13 ENCOUNTER — Other Ambulatory Visit (HOSPITAL_COMMUNITY): Payer: Medicare Other

## 2017-09-14 ENCOUNTER — Encounter (HOSPITAL_COMMUNITY): Payer: Self-pay | Admitting: Professional

## 2017-09-14 ENCOUNTER — Other Ambulatory Visit (HOSPITAL_COMMUNITY): Payer: Medicare Other | Admitting: Licensed Clinical Social Worker

## 2017-09-14 VITALS — BP 116/74 | HR 91 | Ht 63.5 in | Wt 139.0 lb

## 2017-09-14 DIAGNOSIS — F332 Major depressive disorder, recurrent severe without psychotic features: Secondary | ICD-10-CM

## 2017-09-14 DIAGNOSIS — F339 Major depressive disorder, recurrent, unspecified: Secondary | ICD-10-CM | POA: Diagnosis present

## 2017-09-14 NOTE — Psych (Signed)
   [facility] PHP THERAPIST PROGRESS NOTE  Margaret Nelson [id number]  Session Time: 9-1  Participation Level: Active  Behavioral Response: CasualAlertAnxious and Depressed  Type of Therapy: Group Therapy, Psychoeducation, Psychotherapy  Treatment Goals addressed: Coping  Interventions: CBT, DBT, Supportive and Reframing  Summary: 9:00 - 10:30 Clinician led check-in regarding current stressors and situation, and review of patient completed daily inventory. Clinician utilized active listening and empathetic response and validated patient emotions. Clinician facilitated processing group on pertinent issues.  10:30 -11:30: Clinician led psychotherapy group on topic of how stigma affects interpersonal relationships.  11:30-12:50: Clinician continued with topic of "Distress Tolerance". Clinician discussed "Accepts" skill and how/when patients can employ this method to help. Patients identified when these techniques may be helpful in their personal lives.  12:50 - 1:00 Clinician led check-out. Clinician assessed for immediate needs, medication compliance and efficacy, and safety concerns.    Suicidal/Homicidal: Yeswithout intent/plan  Therapist Response: Margaret Nelson is a 66 y.o. female who presents with anxiety and depression symptoms. Patient arrived within time allowed and reports she is feeling "OK, but typically I get worse throughout the day." Patient rates her mood at a 6 on a 1- 10 scale with 10 being great. Pt shares that she had a "rough" day yesterday and wasn't able to get off the couch to do what she felt she needed to do.  Pt is stressed about moving her furniture out of her son's house this weekend. Pt reports she SI yesterday but does not have a plan.  Pt denies any plan or intent currently. Patient engaged in activity and discussion. Patient demonstrates some progress as evidenced by participating in her first session. Patient denies SI/HI/self-harm thoughts at end of  session.  Plan: Patient will continue in PHP and medication management while working on decreasing SI, depression and anxiety symptoms, and increasing distress tolerance skills.  Diagnosis: Severe episode of recurrent major depressive disorder, without psychotic features ([place]) [F33.2]    1. Severe episode of recurrent major depressive disorder, without psychotic features ([place])     Royetta Crochet, LPCA 09/14/2017
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
## 2017-09-17 ENCOUNTER — Other Ambulatory Visit (HOSPITAL_COMMUNITY): Payer: Medicare Other | Admitting: Licensed Clinical Social Worker

## 2017-09-17 DIAGNOSIS — F332 Major depressive disorder, recurrent severe without psychotic features: Secondary | ICD-10-CM | POA: Diagnosis not present
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
## 2017-09-18 ENCOUNTER — Other Ambulatory Visit (HOSPITAL_COMMUNITY): Payer: Self-pay

## 2017-09-19 ENCOUNTER — Other Ambulatory Visit (HOSPITAL_COMMUNITY): Payer: Self-pay

## 2017-09-20 ENCOUNTER — Other Ambulatory Visit (HOSPITAL_COMMUNITY): Payer: Medicare Other | Admitting: Occupational Therapy

## 2017-09-20 ENCOUNTER — Other Ambulatory Visit (HOSPITAL_COMMUNITY): Payer: Medicare Other | Admitting: Licensed Clinical Social Worker

## 2017-09-20 DIAGNOSIS — F332 Major depressive disorder, recurrent severe without psychotic features: Secondary | ICD-10-CM

## 2017-09-20 DIAGNOSIS — F0789 Other personality and behavioral disorders due to known physiological condition: Secondary | ICD-10-CM

## 2017-09-20 NOTE — Psych (Signed)
Individual session as component of PHP 09/17/17 10:00 - 10:30  Cln discussed pt's current situation and what her goals are for treatment. Cln validated pt's struggles and motivations. Cln cued pt on times she could utilize distress tolerance skills being taught in group.  Pt shares she had a mentally draining weekend. Her sister and 2 nephews went with pt to son's house to gather her furniture and other belongings. Pt states it was difficult to see her grandchildren and dog and have to leave them. Pt states she did not have any run ins with her son and it went best case scenario under the circumstances. Pt states she is hoping to gain more energy and increase her depressed mood during her time in group. Pt states she has no way to deal with her mood and is hoping to build skills. Pt states she has tried to utilize distress tolerance skills in the past 3 days but keeps forgetting to try them. Pt denies any current SI/HI and states she feels stable enough to make it through the holiday.   [name] MSW, LCSW, LCAS

## 2017-09-20 NOTE — Psych (Signed)
   [facility] BH PHP THERAPIST PROGRESS NOTE  [name] [id number]  Session Time: 8- 12  Participation Level: Active  Behavioral Response: CasualAlertAnxious and Depressed  Type of Therapy: Group Therapy, Psychoeducation, Psychotherapy, Activity Therapy, Medication management, Individual therapy  Treatment Goals addressed: Coping  Interventions: CBT, DBT, Supportive and Reframing  Summary:  8:00 - 9:00 Clinician led check-in regarding current stressors and situation, and review of patient completed daily inventory. Clinician utilized active listening and empathetic response and validated patient emotions. Clinician facilitated processing group on pertinent issues.  9:00 -10:00: Pharmacist discussed medication with group and answered questions.  10:00- 10:30: Individual Therapy 10:30-11:00: Clinician continued topic of "Distress Tolerance". Group discussed Self Soothe and how/when patients can employ this method to help. Patients identified when this technique may be helpful in their personal lives.  11:00 - 11:50: Activity Therapy: Group made glitter bottles and discussed how the bottles can be used to aid in mindfulness and grounding.  11:50 - 12:00 Clinician led check-out. Clinician assessed for immediate needs, medication compliance and efficacy, and safety concerns.    Suicidal/Homicidal: Nowithout intent/plan  Therapist Response: Margaret Nelson is a 66 y.o. female who presents with anxiety and depression symptoms. Patient arrived within time allowed and reports she is feeling "okay." Patient rates her mood at a 7 on a 1- 10 scale with 10 being great. Pt reports she looked forward to coming in today and it was "nice" not having to force herself out of the house.  Pt states she went with family to get her things from her son's house on Saturday and it went as well as could be expected.Pt reports continued sleep issues. She shares she is not looking forward to the holiday and does not  want to celebrate it.  Patient engaged in activity and discussion. Patient demonstrates some progress as evidenced by increased ease in leaving the house for treatment. Patient denies SI/HI/self-harm thoughts at end of session.  Plan: Patient will continue in PHP and medication management while working on decreasing SI, depression and anxiety symptoms, and increasing distress tolerance skills.  Diagnosis: Severe recurrent major depression without psychotic features ([place]) [F33.2]    1. Severe recurrent major depression without psychotic features [facility])     [name], LCSW 09/20/2017
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
## 2017-09-21 ENCOUNTER — Other Ambulatory Visit (HOSPITAL_COMMUNITY): Payer: Medicare Other | Admitting: Licensed Clinical Social Worker

## 2017-09-21 VITALS — BP 118/80 | HR 83 | Ht 63.5 in | Wt 140.0 lb

## 2017-09-21 DIAGNOSIS — F332 Major depressive disorder, recurrent severe without psychotic features: Secondary | ICD-10-CM
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
## 2017-09-24 ENCOUNTER — Other Ambulatory Visit: Payer: Self-pay | Admitting: Internal Medicine

## 2017-09-24 ENCOUNTER — Other Ambulatory Visit (HOSPITAL_COMMUNITY): Payer: Medicare Other | Admitting: Psychiatry

## 2017-09-24 DIAGNOSIS — F332 Major depressive disorder, recurrent severe without psychotic features: Secondary | ICD-10-CM
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
## 2017-09-25 ENCOUNTER — Other Ambulatory Visit (HOSPITAL_COMMUNITY): Payer: Self-pay

## 2017-09-26 ENCOUNTER — Encounter (HOSPITAL_COMMUNITY): Payer: Self-pay | Admitting: Occupational Therapy

## 2017-09-26 ENCOUNTER — Other Ambulatory Visit (HOSPITAL_COMMUNITY): Payer: Self-pay

## 2017-09-26 ENCOUNTER — Telehealth (HOSPITAL_COMMUNITY): Payer: Self-pay | Admitting: Professional
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
## 2017-09-27 ENCOUNTER — Ambulatory Visit (HOSPITAL_COMMUNITY): Payer: Self-pay

## 2017-09-27 ENCOUNTER — Other Ambulatory Visit (HOSPITAL_COMMUNITY): Payer: Self-pay

## 2017-09-27 ENCOUNTER — Telehealth (HOSPITAL_COMMUNITY): Payer: Self-pay | Admitting: Professional

## 2017-09-28 ENCOUNTER — Other Ambulatory Visit (HOSPITAL_COMMUNITY): Payer: Self-pay

## 2017-09-28 ENCOUNTER — Telehealth (HOSPITAL_COMMUNITY): Payer: Self-pay | Admitting: Professional

## 2017-10-01 ENCOUNTER — Telehealth (HOSPITAL_COMMUNITY): Payer: Self-pay | Admitting: Professional

## 2017-10-01 ENCOUNTER — Other Ambulatory Visit (HOSPITAL_COMMUNITY): Payer: Self-pay

## 2017-10-02 ENCOUNTER — Other Ambulatory Visit (HOSPITAL_COMMUNITY): Payer: Self-pay

## 2017-10-02 ENCOUNTER — Ambulatory Visit (HOSPITAL_COMMUNITY): Payer: Self-pay | Admitting: Occupational Therapy

## 2017-10-03 ENCOUNTER — Telehealth (HOSPITAL_COMMUNITY): Payer: Self-pay | Admitting: Professional

## 2017-10-03 ENCOUNTER — Other Ambulatory Visit (HOSPITAL_COMMUNITY): Payer: Self-pay

## 2017-10-04 ENCOUNTER — Ambulatory Visit (HOSPITAL_COMMUNITY): Payer: Self-pay

## 2017-10-04 ENCOUNTER — Telehealth (HOSPITAL_COMMUNITY): Payer: Self-pay | Admitting: Professional

## 2017-10-04 ENCOUNTER — Other Ambulatory Visit (HOSPITAL_COMMUNITY): Payer: Self-pay
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
## 2017-10-05 ENCOUNTER — Other Ambulatory Visit (HOSPITAL_COMMUNITY): Payer: Self-pay

## 2017-10-08 ENCOUNTER — Other Ambulatory Visit (HOSPITAL_COMMUNITY): Payer: Self-pay

## 2017-10-09 ENCOUNTER — Ambulatory Visit: Payer: Self-pay | Admitting: Internal Medicine

## 2017-10-09 ENCOUNTER — Ambulatory Visit (INDEPENDENT_AMBULATORY_CARE_PROVIDER_SITE_OTHER): Payer: Medicare Other | Admitting: Internal Medicine

## 2017-10-09 ENCOUNTER — Other Ambulatory Visit (HOSPITAL_COMMUNITY): Payer: Self-pay

## 2017-10-09 ENCOUNTER — Encounter: Payer: Self-pay | Admitting: Internal Medicine

## 2017-10-09 ENCOUNTER — Ambulatory Visit (HOSPITAL_COMMUNITY): Payer: Self-pay

## 2017-10-09 VITALS — BP 112/72 | HR 80 | Temp 97.5°F | Resp 16 | Ht 63.25 in | Wt 136.8 lb

## 2017-10-09 DIAGNOSIS — F329 Major depressive disorder, single episode, unspecified: Secondary | ICD-10-CM | POA: Diagnosis not present

## 2017-10-09 DIAGNOSIS — F4310 Post-traumatic stress disorder, unspecified: Secondary | ICD-10-CM

## 2017-10-09 DIAGNOSIS — F32A Depression, unspecified: Secondary | ICD-10-CM

## 2017-10-09 DIAGNOSIS — I10 Essential (primary) hypertension: Secondary | ICD-10-CM | POA: Diagnosis not present
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
## 2017-10-10 ENCOUNTER — Other Ambulatory Visit (HOSPITAL_COMMUNITY): Payer: Self-pay

## 2017-10-11 ENCOUNTER — Ambulatory Visit (HOSPITAL_COMMUNITY): Payer: Self-pay

## 2017-10-11 ENCOUNTER — Other Ambulatory Visit (HOSPITAL_COMMUNITY): Payer: Self-pay

## 2017-10-12 ENCOUNTER — Other Ambulatory Visit (HOSPITAL_COMMUNITY): Payer: Self-pay

## 2017-10-22 DIAGNOSIS — F330 Major depressive disorder, recurrent, mild: Secondary | ICD-10-CM | POA: Diagnosis not present

## 2017-11-05 DIAGNOSIS — M79671 Pain in right foot: Secondary | ICD-10-CM | POA: Diagnosis not present

## 2017-11-13 ENCOUNTER — Encounter: Payer: Self-pay | Admitting: Physician Assistant

## 2017-11-13 ENCOUNTER — Ambulatory Visit (INDEPENDENT_AMBULATORY_CARE_PROVIDER_SITE_OTHER): Payer: Medicare Other | Admitting: Physician Assistant

## 2017-11-13 VITALS — BP 114/72 | HR 73 | Temp 97.7°F | Resp 14 | Ht 63.25 in | Wt 133.4 lb

## 2017-11-13 DIAGNOSIS — Z6823 Body mass index (BMI) 23.0-23.9, adult: Secondary | ICD-10-CM

## 2017-11-13 DIAGNOSIS — R6889 Other general symptoms and signs: Secondary | ICD-10-CM

## 2017-11-13 DIAGNOSIS — F332 Major depressive disorder, recurrent severe without psychotic features: Secondary | ICD-10-CM

## 2017-11-13 DIAGNOSIS — F17200 Nicotine dependence, unspecified, uncomplicated: Secondary | ICD-10-CM

## 2017-11-13 DIAGNOSIS — M79671 Pain in right foot: Secondary | ICD-10-CM

## 2017-11-13 DIAGNOSIS — I10 Essential (primary) hypertension: Secondary | ICD-10-CM | POA: Diagnosis not present

## 2017-11-13 DIAGNOSIS — E559 Vitamin D deficiency, unspecified: Secondary | ICD-10-CM

## 2017-11-13 DIAGNOSIS — Z0000 Encounter for general adult medical examination without abnormal findings: Secondary | ICD-10-CM

## 2017-11-13 DIAGNOSIS — J45909 Unspecified asthma, uncomplicated: Secondary | ICD-10-CM

## 2017-11-13 DIAGNOSIS — Z0001 Encounter for general adult medical examination with abnormal findings: Secondary | ICD-10-CM

## 2017-11-13 DIAGNOSIS — F3341 Major depressive disorder, recurrent, in partial remission: Secondary | ICD-10-CM

## 2017-11-13 DIAGNOSIS — K219 Gastro-esophageal reflux disease without esophagitis: Secondary | ICD-10-CM | POA: Diagnosis not present

## 2017-11-13 DIAGNOSIS — E079 Disorder of thyroid, unspecified: Secondary | ICD-10-CM | POA: Diagnosis not present

## 2017-11-13 DIAGNOSIS — E782 Mixed hyperlipidemia: Secondary | ICD-10-CM

## 2017-11-13 DIAGNOSIS — Z79899 Other long term (current) drug therapy: Secondary | ICD-10-CM | POA: Diagnosis not present

## 2017-11-13 LAB — CBC WITH DIFFERENTIAL/PLATELET
Basophils Absolute: 103 cells/uL (ref 0–200)
Basophils Relative: 0.9 %
Eosinophils Absolute: 182 cells/uL (ref 15–500)
Eosinophils Relative: 1.6 %
HCT: 39.2 % (ref 35.0–45.0)
Hemoglobin: 13.3 g/dL (ref 11.7–15.5)
Lymphs Abs: 3192 cells/uL (ref 850–3900)
MCH: 31.1 pg (ref 27.0–33.0)
MCHC: 33.9 g/dL (ref 32.0–36.0)
MCV: 91.6 fL (ref 80.0–100.0)
MPV: 10.9 fL (ref 7.5–12.5)
Monocytes Relative: 5.1 %
Neutro Abs: 7342 cells/uL (ref 1500–7800)
Neutrophils Relative %: 64.4 %
Platelets: 391 10*3/uL (ref 140–400)
RBC: 4.28 10*6/uL (ref 3.80–5.10)
RDW: 12.3 % (ref 11.0–15.0)
Total Lymphocyte: 28 %
WBC mixed population: 581 cells/uL (ref 200–950)
WBC: 11.4 10*3/uL — ABNORMAL HIGH (ref 3.8–10.8)

## 2017-11-13 LAB — BASIC METABOLIC PANEL WITH GFR
BUN: 19 mg/dL (ref 7–25)
CO2: 31 mmol/L (ref 20–32)
Calcium: 9.9 mg/dL (ref 8.6–10.4)
Chloride: 98 mmol/L (ref 98–110)
Creat: 0.92 mg/dL (ref 0.50–0.99)
GFR, Est African American: 76 mL/min/{1.73_m2} (ref >=60)
GFR, Est Non African American: 66 mL/min/{1.73_m2} (ref >=60)
Glucose, Bld: 106 mg/dL — ABNORMAL HIGH (ref 65–99)
Potassium: 4.6 mmol/L (ref 3.5–5.3)
Sodium: 143 mmol/L (ref 135–146)

## 2017-11-13 LAB — HEPATIC FUNCTION PANEL
AG Ratio: 1.7 (calc) (ref 1.0–2.5)
ALT: 12 U/L (ref 6–29)
AST: 12 U/L (ref 10–35)
Albumin: 4.5 g/dL (ref 3.6–5.1)
Alkaline phosphatase (APISO): 85 U/L (ref 33–130)
Bilirubin, Direct: 0.1 mg/dL (ref 0.0–0.2)
Globulin: 2.7 g/dL (calc) (ref 1.9–3.7)
Indirect Bilirubin: 0.3 mg/dL (calc) (ref 0.2–1.2)
Total Bilirubin: 0.4 mg/dL (ref 0.2–1.2)
Total Protein: 7.2 g/dL (ref 6.1–8.1)

## 2017-11-13 LAB — LIPID PANEL
Cholesterol: 185 mg/dL (ref <200)
HDL: 55 mg/dL (ref >50)
LDL Cholesterol (Calc): 92 mg/dL (calc)
Non-HDL Cholesterol (Calc): 130 mg/dL (calc) — ABNORMAL HIGH (ref <130)
Total CHOL/HDL Ratio: 3.4 (calc) (ref <5.0)
Triglycerides: 268 mg/dL — ABNORMAL HIGH (ref <150)

## 2017-11-13 LAB — TSH: TSH: 2.14 mIU/L (ref 0.40–4.50)

## 2017-11-13 NOTE — Progress Notes (Signed)
MEDICARE ANNUAL WELLNESS VISIT AND FOLLOW UP  Assessment:    Essential hypertension - continue medications, DASH diet, exercise and monitor at home. Call if greater than 130/80.  -     CBC with Differential/Platelet -     BASIC METABOLIC PANEL WITH GFR -     Hepatic function panel  Extrinsic asthma without complication, unspecified asthma severity, unspecified whether persistent No triggers, well controlled symptoms, cont to monitor  Hypothyroidism Hypothyroidism-check TSH level, continue medications the same, reminded to take on an empty stomach 30-50mins before food.  -     TSH  Hyperlipidemia -continue medications, check lipids, decrease fatty foods, increase activity.  -     Lipid panel  Depression, major, recurrent, in partial remission (HCC) Continue meds, continue close follow up psych  Tobacco use disorder Smoking cessation-  instruction/counseling given, counseled patient on the dangers of tobacco use, advised patient to stop smoking, and reviewed strategies to maximize success, patient not ready to quit at this time.   Medication management  Encounter for Medicare annual wellness exam 1 year Get MGM  Severe recurrent major depression without psychotic features ([place]) Continue meds, continue close follow up psych Advised to leave current living situation  Body mass index (BMI) of 23.0-23.9 in adult Monitor  Vitamin D deficiency Continue supplement  Gastroesophageal reflux disease, esophagitis presence not specified Continue PPI/H2 blocker, diet discussed  Right foot pain -     DME Crutches - had negative Xray 2 days after fall at UC, negative - likely sprain however if pain continues will repeat XRAY and refer to ortho - roll on ice bottle - get medical boot/crutches - ibuprofen/tylenol.     Over 30 minutes of exam, counseling, chart review, and critical decision making was performed  Future Appointments  Date Time Provider [place]   05/21/2018  3:00 PM Unk Pinto, MD [facility] None    Plan:   During the course of the visit the patient was educated and counseled about appropriate screening and preventive services including:    Pneumococcal vaccine   Influenza vaccine  Td vaccine  Prevnar 13  Screening electrocardiogram  Screening mammography  Bone densitometry screening  Colorectal cancer screening  Diabetes screening  Glaucoma screening  Nutrition counseling   Advanced directives: given info/requested copies   Subjective:   Margaret Nelson is a 67 y.o. female who presents for Medicare Annual Wellness Visit and 3 month follow up on hypertension, prediabetes, hyperlipidemia, vitamin D def.   She has a history of depression, she is on prozac, wellbutrin and a large dose of xanax/ritalin, followed by Dr. Toy Care.   She states she fell 2 weeks ago while walking the dog, her foot got tangled and she fell. Has been having right foot pain since that next day. She states she could not put her foot down at that time, she waited several days and went to UC, xrays negative. She has not stayed off of it, has been icing it. She has had pain at her forefoot, stabbing pain and pain at her   Her blood pressure has been controlled at home, today their BP is BP: 114/72 She does workout. She denies chest pain, shortness of breath, dizziness.   She is on cholesterol medication and denies myalgias. Her cholesterol is at goal. The cholesterol last visit was:   Lab Results  Component Value Date   CHOL 172 07/24/2017   HDL 64 07/24/2017   LDLCALC 113 (H) 04/24/2017   TRIG 174 (H) 07/24/2017  CHOLHDL 2.7 07/24/2017   Lab Results  Component Value Date   HGBA1C 5.4 07/24/2017   Last GFR Lab Results  Component Value Date   GFRNONAA 79 07/24/2017   Patient is on Vitamin D supplement. Lab Results  Component Value Date   VD25OH 44 07/24/2017      She is on thyroid medication. Her medication was not  changed last visit.   Lab Results  Component Value Date   TSH 0.69 07/24/2017   BMI is Body mass index is 23.44 kg/m., she is working on diet and exercise. Wt Readings from Last 3 Encounters:  11/13/17 133 lb 6.4 oz (60.5 kg)  10/09/17 136 lb 12.8 oz (62.1 kg)  07/24/17 141 lb 6.4 oz (64.1 kg)     Medication Review Current Outpatient Medications on File Prior to Visit  Medication Sig  . ALPRAZolam (XANAX) 1 MG tablet take 1/2 to 1 tablets by mouth three times a day to four times a day  . atorvastatin (LIPITOR) 80 MG tablet take 1/2 to 1 tablets by mouth once daily if needed for cholesterol  . BREO ELLIPTA 200-25 MCG/INH AEPB inhale 1 puff INTO THE LUNGS DAILY  . bumetanide (BUMEX) 2 MG tablet take 1 tablet by mouth twice a day  . buPROPion (WELLBUTRIN XL) 300 MG 24 hr tablet take 1 tablet by mouth once daily  . Cholecalciferol (VITAMIN D PO) Take 5,000 Units by mouth daily.  . Flaxseed, Linseed, (FLAX SEED OIL) 1300 MG CAPS Take 1,300 mg by mouth 3 (three) times daily.  Marland Kitchen FLUoxetine (PROZAC) 40 MG capsule tke 1 capsule daily for Mood  . levothyroxine (SYNTHROID, LEVOTHROID) 50 MCG tablet take 1 to 1 and 1/2 tablets by mouth once daily  . methylphenidate (RITALIN) 20 MG tablet Take 1/2 to 1 tablet 3 x day as directed for depression.  Do not take more than 5 days per week  . Omega-3 Fatty Acids (FISH OIL) 1000 MG CAPS Take 1 capsule (1,000 mg total) by mouth 3 (three) times daily.  . potassium chloride SA (K-DUR,KLOR-CON) 20 MEQ tablet Take 1 tablet (20 mEq total) by mouth 3 (three) times daily.  Marland Kitchen PROAIR HFA 108 (90 Base) MCG/ACT inhaler inhale 2 puffs by mouth every 6 hours if needed for wheezing or shortness of breath  . 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   No current facility-administered medications on file prior to visit.     Allergies: Allergies  Allergen Reactions  . Codeine  Other (See Comments)    Abdominal pain  . Erythromycin Base     GI upset  . Penicillins Rash    Current Problems (verified) has Essential hypertension; Hyperlipidemia; Depression, major, recurrent, in partial remission ([place]); Vitamin D deficiency; Hypothyroidism; Medication management; GERD ; Tobacco use disorder; Encounter for Medicare annual wellness exam; Extrinsic asthma; Body mass index (BMI) of 23.0-23.9 in adult; and Severe recurrent major depression without psychotic features ([place]) on their problem list.  Screening Tests Immunization History  Administered Date(s) Administered  . Influenza Split 06/22/2015  . Influenza,inj,quad, With Preservative 07/05/2016  . Influenza-Unspecified 07/22/2017  . Pneumococcal-Unspecified 09/25/2004  . Td 09/25/2004  . Tdap 12/01/2015    Preventative care: Last colonoscopy: 2015  Last mammogram: 2015, OVERDUE S/P BILATERAL MASECTOMY NOT DUE TO CANCER IN EARLY 80'S HAS IMPLANTS DEXA:No indicated at this time.  Prior vaccinations: TD or Tdap: 2017  Influenza: 2018 Pneumococcal: 2006  Shingles/Zostavax: Declined secondary to cost  Names of Other Physician/Practitioners you currently use: 1. [facility]- here for primary care 2. Dr. Katy Fitch, eye doctor, last visit 2016 3. [facility] , dentist, last visit Jan 2019 Patient Care Team: Unk Pinto, MD as PCP - General (Internal Medicine) Dian Queen, MD as Consulting Physician (Obstetrics and Gynecology) Milus Banister, MD as Attending Physician (Gastroenterology)  Surgical: She  has a past surgical history that includes Cholecystectomy (1975); Nasal sinus surgery (1996); Liposuction (1998); Breast surgery (Bilateral, 1983); Breast reconstruction (2008); Appendectomy; bil breast lumpectomy; bilateral breast masectomy (no breast cancer); and EUS (N/A, 04/23/2014). Family Her family history includes ADD / ADHD in her son; Alcohol abuse in her  father and paternal uncle; Anxiety disorder in her sister; COPD in her mother; Dementia in her mother; Depression in her mother and sister; Diabetes in her sister; Early death in her father and sister; Emphysema in her mother; Lung cancer in her mother; Schizophrenia in her sister. Social history  She reports that she has been smoking e-cigarettes.  She has been smoking about 0.50 packs per day. she has never used smokeless tobacco. She reports that she drinks about 2.4 - 3.0 oz of alcohol per week. She reports that she does not use drugs.  MEDICARE WELLNESS OBJECTIVES: Physical activity: Current Exercise Habits: The patient does not participate in regular exercise at present Cardiac risk factors: Cardiac Risk Factors include: advanced age (>63men, >9 women);dyslipidemia;hypertension;sedentary lifestyle Depression/mood screen:   Depression screen [facility] 2/9 11/13/2017  Decreased Interest 3  Down, Depressed, Hopeless 2  PHQ - 2 Score 5  Altered sleeping 3  Tired, decreased energy 2  Change in appetite 2  Feeling bad or failure about yourself  2  Trouble concentrating 2  Moving slowly or fidgety/restless 1  Suicidal thoughts 0  PHQ-9 Score 17  Difficult doing work/chores Extremely dIfficult  Some encounter information is confidential and restricted. Go to Review Flowsheets activity to see all data.  Some recent data might be hidden    ADLs:  In your present state of health, do you have any difficulty performing the following activities: 11/13/2017 10/09/2017  Hearing? N N  Vision? N N  Difficulty concentrating or making decisions? Y Y  Comment - indecisive  Walking or climbing stairs? N N  Dressing or bathing? N N  Doing errands, shopping? N N  Some recent data might be hidden     Cognitive Testing  Alert? Yes  Normal Appearance?Yes  Oriented to person? Yes  Place? Yes   Time? Yes  Recall of three objects?  Yes  Can perform simple calculations? Yes  Displays appropriate  judgment?Yes  Can read the correct time from a watch face?Yes  EOL planning: Does Patient Have a Medical Advance Directive?: No Would patient like information on creating a medical advance directive?: Yes (MAU/Ambulatory/Procedural Areas - Information given)   Objective:   Today's Vitals   11/13/17 1439  BP: 114/72  Pulse: 73  Resp: 14  Temp: 97.7 F (36.5 C)  SpO2: 97%  Weight: 133 lb 6.4 oz (60.5 kg)  Height: 5' 3.25" (1.607 m)  PainSc: 4   PainLoc: Foot   Body mass index is 23.44 kg/m.  General appearance: alert, no distress, WD/WN,  female HEENT: normocephalic, sclerae anicteric, TMs pearly, nares patent, no discharge or erythema, pharynx normal Oral cavity: MMM, no lesions Neck: supple, no lymphadenopathy, no thyromegaly, no masses Heart: RRR, normal S1, S2, no murmurs Lungs: CTA bilaterally,  no wheezes, rhonchi, or rales Abdomen: +bs, soft, non tender, non distended, no masses, no hepatomegaly, no splenomegaly Musculoskeletal: nontender, no swelling, no obvious deformity, RIGHT FOOT no redness, bruising, warmth, swelling, + pain with lateral squeeze test, no laxity or pain in ankle, full ROM. Some pain with palpation of forefoot. Good distal nuerovascular Extremities: no edema, no cyanosis, no clubbing Pulses: 2+ symmetric, upper and lower extremities, normal cap refill Neurological: alert, oriented x 3, CN2-12 intact, strength normal upper extremities and lower extremities, sensation normal throughout, DTRs 2+ throughout, no cerebellar signs, gait normal Psychiatric: normal affect, behavior normal, pleasant  Breast: defer Gyn: defer Rectal: defer   Medicare Attestation I have personally reviewed: The patient's medical and social history Their use of alcohol, tobacco or illicit drugs Their current medications and supplements The patient's functional ability including ADLs,fall risks, home safety risks, cognitive, and hearing and visual impairment Diet and physical  activities Evidence for depression or mood disorders  The patient's weight, height, BMI, and visual acuity have been recorded in the chart.  I have made referrals, counseling, and provided education to the patient based on review of the above and I have provided the patient with a written personalized care plan for preventive services.     Vicie Mutters, PA-C   11/13/2017

## 2017-11-13 NOTE — Patient Instructions (Addendum)
[facility]  7 a.m.-6:30 p.m., Monday 7 a.m.-5 p.m., Tuesday-Friday Schedule an appointment by calling [phone].  Encourage you to get the 3D Mammogram  The 3D Mammogram is much more specific and sensitive to pick up breast cancer. For women with fibrocystic breast or lumpy breast it can be hard to determine if it is cancer or not but the 3D mammogram is able to tell this difference which cuts back on unneeded additional tests or scary call backs.   - over 40% increase in detection of breast cancer - over 40% reduction in false positives.  - fewer call backs - reduced anxiety - improved outcomes - [facility]   Wear boot x 2 weeks Crutches x 2 weeks Freeze plastic water bottle and roll foot on it night and morning, 10 mins Can use ibuprofen and tylenol Elevate your foot as much as possible IF NOT BETTER WILL GET XRAY AND REFER TO ORTHO  Aleve, ibuprofen is an antiinflammatory You can take tylenol (500mg ) or tylenol arthritis (650mg ) with the meloxicam/antiinflammatories. The max you can take of tylenol a day is 3000mg  daily, this is a max of 6 pills a day of the regular tyelnol (500mg ) or a max of 4 a day of the tylenol arthritis (650mg ) as long as no other medications you are taking contain tylenol.   this can cause inflammation in your stomach and can cause ulcers or bleeding, this will look like black tarry stools Make sure you taking it with food Try not to take it daily, take AS needed Can take with zantac   Foot Pain Many things can cause foot pain. Some common causes are:  An injury.  A sprain.  Arthritis.  Blisters.  Bunions.  Follow these instructions at home: Pay attention to any changes in your symptoms. Take these actions to help with your discomfort:  If directed, put ice on the affected area: ? Put ice in a plastic bag. ? Place a towel between your skin and the bag. ? Leave the ice on for 15-20 minutes, 3?4 times a  day for 2 days.  Take over-the-counter and prescription medicines only as told by your health care provider.  Wear comfortable, supportive shoes that fit you well. Do not wear high heels.  Do not stand or walk for long periods of time.  Do not lift a lot of weight. This can put added pressure on your feet.  Do stretches to relieve foot pain and stiffness as told by your health care provider.  Rub your foot gently.  Keep your feet clean and dry.  Contact a health care provider if:  Your pain does not get better after a few days of self-care.  Your pain gets worse.  You cannot stand on your foot. Get help right away if:  Your foot is numb or tingling.  Your foot or toes are swollen.  Your foot or toes turn white or blue.  You have warmth and redness along your foot. This information is not intended to replace advice given to you by your health care provider. Make sure you discuss any questions you have with your health care provider. Document Released: 10/08/2015 Document Revised: 02/17/2016 Document Reviewed: 10/07/2014 Elsevier Interactive Patient Education  [employer].

## 2017-11-15 ENCOUNTER — Ambulatory Visit: Payer: Self-pay | Admitting: Internal Medicine

## 2017-11-16 DIAGNOSIS — F330 Major depressive disorder, recurrent, mild: Secondary | ICD-10-CM | POA: Diagnosis not present

## 2017-11-20 ENCOUNTER — Other Ambulatory Visit: Payer: Self-pay | Admitting: Internal Medicine

## 2017-11-21 ENCOUNTER — Other Ambulatory Visit: Payer: Self-pay | Admitting: Internal Medicine

## 2017-11-26 ENCOUNTER — Other Ambulatory Visit: Payer: Self-pay | Admitting: Internal Medicine

## 2017-12-12 ENCOUNTER — Ambulatory Visit: Payer: Self-pay | Admitting: Internal Medicine

## 2017-12-18 ENCOUNTER — Ambulatory Visit: Payer: Self-pay | Admitting: Internal Medicine

## 2017-12-20 ENCOUNTER — Ambulatory Visit: Payer: Self-pay | Admitting: Internal Medicine

## 2017-12-20 ENCOUNTER — Encounter: Payer: Self-pay | Admitting: Internal Medicine

## 2017-12-20 ENCOUNTER — Ambulatory Visit (INDEPENDENT_AMBULATORY_CARE_PROVIDER_SITE_OTHER): Payer: Medicare Other | Admitting: Internal Medicine

## 2017-12-20 VITALS — BP 122/70 | HR 72 | Temp 97.3°F | Resp 16 | Ht 63.25 in | Wt 139.4 lb

## 2017-12-20 DIAGNOSIS — F3341 Major depressive disorder, recurrent, in partial remission: Secondary | ICD-10-CM | POA: Diagnosis not present

## 2017-12-20 DIAGNOSIS — F909 Attention-deficit hyperactivity disorder, unspecified type: Secondary | ICD-10-CM

## 2017-12-20 DIAGNOSIS — E079 Disorder of thyroid, unspecified: Secondary | ICD-10-CM | POA: Diagnosis not present

## 2017-12-20 DIAGNOSIS — I10 Essential (primary) hypertension: Secondary | ICD-10-CM

## 2017-12-20 MED ORDER — METHYLPHENIDATE HCL 20 MG PO TABS: ORAL_TABLET | ORAL | 0 refills | Status: DC

## 2017-12-20 NOTE — Progress Notes (Signed)
  Subjective:    Patient ID: Margaret Nelson, female    DOB: 1951-07-14, 67 y.o.   MRN: [id number]  HPI  This very nice 67 yo DWF with hx/o Severe Depression  in a toxic relationship with a bipolar controlling partner & she was recently referred to establish with a new psychiatrist. She presents  relating that the visit with the psychiatrist was unproductive feeling that the provider was preoccupied and had mis information of her medical / psychiatric profile . She indicates a plan not to return to the Psychiatrist. She admits ongoing issues with Depression and difficulty with other interpersonal relationships. Denies SI.     Patient also is monitored for controlled Hypertension and systems review is negative.Also she has compensated Hypothyroidism.  Review of Systems  10 point systems review negative except as above.    Objective:   Physical Exam . BP 122/70   Pulse 72   Temp (!) 97.3 F (36.3 C)   Resp 16   Ht 5' 3.25" (1.607 m)   Wt 139 lb 6.4 oz (63.2 kg)   BMI 24.50 kg/m   HEENT - WNL. Neck - supple.  Chest - Clear equal BS. Cor - Nl HS. RRR w/o sig MGR. PP 1(+). No edema. MS- FROM w/o deformities.  Gait Nl. Neuro -  Nl w/o focal abnormalities.    Assessment & Plan:   1. Essential hypertension  2. Hypothyroidism  3. Depression, major, recurrent, in partial remission ([place])  4. Attention deficit hyperactivity disorder (ADHD), unspecified ADHD type  - methylphenidate (RITALIN) 20 MG tablet; Take 1/2 to 1 tablet 3 x day as directed for depression.  Do not take more than 5 days per week  Dispense: 90 tablet; Refill: 0

## 2017-12-26 ENCOUNTER — Other Ambulatory Visit: Payer: Self-pay | Admitting: *Deleted

## 2017-12-26 DIAGNOSIS — R609 Edema, unspecified: Secondary | ICD-10-CM

## 2017-12-26 DIAGNOSIS — I10 Essential (primary) hypertension: Secondary | ICD-10-CM

## 2017-12-26 MED ORDER — BUMETANIDE 2 MG PO TABS: 2.0000 mg | ORAL_TABLET | Freq: Two times a day (BID) | ORAL | 1 refills | Status: DC

## 2018-01-15 ENCOUNTER — Telehealth: Payer: Self-pay | Admitting: *Deleted

## 2018-01-15 NOTE — Telephone Encounter (Signed)
Patient called and requested Dr Melford Aase change her Ativan 0.5 mg to a different medication.  Per Dr Melford Aase, he informed her Dr Casimiro Needle, who ordered the Ativan would have to change the RX. The patient would like a different pyschatrist, but will discuss with her therapist at her next visit.

## 2018-01-29 DIAGNOSIS — F330 Major depressive disorder, recurrent, mild: Secondary | ICD-10-CM | POA: Diagnosis not present

## 2018-02-12 ENCOUNTER — Other Ambulatory Visit: Payer: Self-pay | Admitting: Internal Medicine

## 2018-02-12 DIAGNOSIS — K219 Gastro-esophageal reflux disease without esophagitis: Secondary | ICD-10-CM

## 2018-02-12 DIAGNOSIS — F909 Attention-deficit hyperactivity disorder, unspecified type: Secondary | ICD-10-CM

## 2018-02-12 MED ORDER — METHYLPHENIDATE HCL 20 MG PO TABS: ORAL_TABLET | ORAL | 0 refills | Status: DC

## 2018-02-12 MED ORDER — RANITIDINE HCL 300 MG PO TABS: ORAL_TABLET | ORAL | 3 refills | Status: DC

## 2018-03-12 DIAGNOSIS — M84374A Stress fracture, right foot, initial encounter for fracture: Secondary | ICD-10-CM | POA: Diagnosis not present

## 2018-03-12 DIAGNOSIS — G5761 Lesion of plantar nerve, right lower limb: Secondary | ICD-10-CM | POA: Diagnosis not present

## 2018-03-12 DIAGNOSIS — E559 Vitamin D deficiency, unspecified: Secondary | ICD-10-CM | POA: Diagnosis not present

## 2018-03-18 ENCOUNTER — Other Ambulatory Visit: Payer: Self-pay | Admitting: Internal Medicine

## 2018-03-18 DIAGNOSIS — F909 Attention-deficit hyperactivity disorder, unspecified type: Secondary | ICD-10-CM

## 2018-03-18 MED ORDER — METHYLPHENIDATE HCL 20 MG PO TABS: ORAL_TABLET | ORAL | 0 refills | Status: DC

## 2018-03-19 DIAGNOSIS — E559 Vitamin D deficiency, unspecified: Secondary | ICD-10-CM | POA: Diagnosis not present

## 2018-03-19 DIAGNOSIS — M84375D Stress fracture, left foot, subsequent encounter for fracture with routine healing: Secondary | ICD-10-CM | POA: Diagnosis not present

## 2018-04-09 DIAGNOSIS — M84374A Stress fracture, right foot, initial encounter for fracture: Secondary | ICD-10-CM | POA: Diagnosis not present

## 2018-04-11 ENCOUNTER — Other Ambulatory Visit: Payer: Self-pay | Admitting: Internal Medicine

## 2018-04-15 ENCOUNTER — Other Ambulatory Visit: Payer: Self-pay | Admitting: Internal Medicine

## 2018-04-15 DIAGNOSIS — F909 Attention-deficit hyperactivity disorder, unspecified type: Secondary | ICD-10-CM

## 2018-04-15 MED ORDER — METHYLPHENIDATE HCL 20 MG PO TABS: ORAL_TABLET | ORAL | 0 refills | Status: DC

## 2018-04-18 ENCOUNTER — Other Ambulatory Visit: Payer: Self-pay | Admitting: Internal Medicine

## 2018-04-18 DIAGNOSIS — K219 Gastro-esophageal reflux disease without esophagitis: Secondary | ICD-10-CM

## 2018-04-22 DIAGNOSIS — R82998 Other abnormal findings in urine: Secondary | ICD-10-CM | POA: Diagnosis not present

## 2018-04-22 DIAGNOSIS — R300 Dysuria: Secondary | ICD-10-CM | POA: Diagnosis not present

## 2018-04-23 DIAGNOSIS — M84374D Stress fracture, right foot, subsequent encounter for fracture with routine healing: Secondary | ICD-10-CM | POA: Diagnosis not present

## 2018-05-02 DIAGNOSIS — F330 Major depressive disorder, recurrent, mild: Secondary | ICD-10-CM | POA: Diagnosis not present

## 2018-05-14 DIAGNOSIS — L570 Actinic keratosis: Secondary | ICD-10-CM | POA: Diagnosis not present

## 2018-05-14 DIAGNOSIS — X32XXXD Exposure to sunlight, subsequent encounter: Secondary | ICD-10-CM | POA: Diagnosis not present

## 2018-05-20 ENCOUNTER — Other Ambulatory Visit: Payer: Self-pay | Admitting: Internal Medicine

## 2018-05-20 DIAGNOSIS — K219 Gastro-esophageal reflux disease without esophagitis: Secondary | ICD-10-CM

## 2018-05-21 ENCOUNTER — Ambulatory Visit (INDEPENDENT_AMBULATORY_CARE_PROVIDER_SITE_OTHER): Payer: Medicare Other | Admitting: Internal Medicine

## 2018-05-21 ENCOUNTER — Encounter: Payer: Self-pay | Admitting: Internal Medicine

## 2018-05-21 VITALS — BP 110/78 | HR 87 | Temp 97.2°F | Ht 63.25 in | Wt 135.0 lb

## 2018-05-21 DIAGNOSIS — K219 Gastro-esophageal reflux disease without esophagitis: Secondary | ICD-10-CM | POA: Diagnosis not present

## 2018-05-21 DIAGNOSIS — R7309 Other abnormal glucose: Secondary | ICD-10-CM | POA: Diagnosis not present

## 2018-05-21 DIAGNOSIS — R0789 Other chest pain: Secondary | ICD-10-CM | POA: Diagnosis not present

## 2018-05-21 DIAGNOSIS — E782 Mixed hyperlipidemia: Secondary | ICD-10-CM | POA: Diagnosis not present

## 2018-05-21 DIAGNOSIS — Z136 Encounter for screening for cardiovascular disorders: Secondary | ICD-10-CM

## 2018-05-21 DIAGNOSIS — R7303 Prediabetes: Secondary | ICD-10-CM

## 2018-05-21 DIAGNOSIS — I10 Essential (primary) hypertension: Secondary | ICD-10-CM | POA: Diagnosis not present

## 2018-05-21 DIAGNOSIS — F17200 Nicotine dependence, unspecified, uncomplicated: Secondary | ICD-10-CM

## 2018-05-21 DIAGNOSIS — E559 Vitamin D deficiency, unspecified: Secondary | ICD-10-CM

## 2018-05-21 DIAGNOSIS — Z1211 Encounter for screening for malignant neoplasm of colon: Secondary | ICD-10-CM

## 2018-05-21 DIAGNOSIS — F332 Major depressive disorder, recurrent severe without psychotic features: Secondary | ICD-10-CM | POA: Diagnosis not present

## 2018-05-21 DIAGNOSIS — F909 Attention-deficit hyperactivity disorder, unspecified type: Secondary | ICD-10-CM | POA: Diagnosis not present

## 2018-05-21 DIAGNOSIS — Z1212 Encounter for screening for malignant neoplasm of rectum: Secondary | ICD-10-CM

## 2018-05-21 DIAGNOSIS — Z79899 Other long term (current) drug therapy: Secondary | ICD-10-CM | POA: Diagnosis not present

## 2018-05-21 DIAGNOSIS — D369 Benign neoplasm, unspecified site: Secondary | ICD-10-CM

## 2018-05-21 DIAGNOSIS — Z8249 Family history of ischemic heart disease and other diseases of the circulatory system: Secondary | ICD-10-CM | POA: Diagnosis not present

## 2018-05-21 DIAGNOSIS — E079 Disorder of thyroid, unspecified: Secondary | ICD-10-CM | POA: Diagnosis not present

## 2018-05-21 MED ORDER — PREDNISONE 20 MG PO TABS: ORAL_TABLET | ORAL | 0 refills | Status: DC

## 2018-05-21 NOTE — Patient Instructions (Signed)

## 2018-05-21 NOTE — Progress Notes (Signed)
[facility] Unk Pinto, [name]     [name], [name] Liane Comber, [place] [address] [place], [place] [address] Telephone [phone] Telefax [phone]   Comprehensive Evaluation &  Examination     This very nice 67 y.o. DWF presents for a  comprehensive evaluation and management of multiple medical co-morbidities.  Patient has been followed for HTN, HLD, Prediabetes  and Vitamin D Deficiency. Patient has GERD controlled on her meds. Patient has been on thyroid replacement for many years.      Patient is on SS Disability for a long hx/o severe Depression circa 1990's w/hx/o ECT x 9 in 2008. She has been followed by Dr Toy Care. 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       HTN predates circa 2008. Patient's BP has been controlled at home and patient denies any cardiac symptoms as chest pain, palpitations, shortness of breath, dizziness or ankle swelling. Today's BP is at goal -  110/78.      Patient's hyperlipidemia is controlled with diet and medications. Patient denies myalgias or other medication SE's. Last lipids were at goal albeit elevated Trig's: Lab Results  Component Value Date   CHOL 171 05/21/2018   HDL 58 05/21/2018   LDLCALC 85 05/21/2018   TRIG 186 (H) 05/21/2018   CHOLHDL 2.9 05/21/2018      Patient has hx/o prediabetes predating  (A1c 5.7%/2012)  and patient denies reactive hypoglycemic symptoms, visual blurring, diabetic polys, or paresthesias. Last A1c was Normal & at goal Lab Results  Component Value Date   HGBA1C 5.4 05/21/2018      Patient has been on Thyroid Replacement since 2010.      Finally, patient has history of Vitamin D Deficiency ("36"/2008)  and last Vitamin D was still low (goal 70-100): Lab Results  Component Value Date   VD25OH 35  05/21/2018   Current Outpatient Medications on File Prior to Visit  Medication Sig  . atorvastatin (LIPITOR) 80 MG tablet take 1/2 to 1 tablets by mouth once daily if needed for cholesterol  . BREO ELLIPTA 200-25 MCG/INH AEPB inhale 1 puff INTO THE LUNGS DAILY  . bumetanide (BUMEX) 2 MG tablet Take 1 tablet (2 mg total) by mouth 2 (two) times daily.  Marland Kitchen buPROPion (WELLBUTRIN XL) 300 MG 24 hr tablet take 1 tablet by mouth once daily (Patient taking differently: Take 150 mg by mouth 3 (three) times daily. )  . Cholecalciferol (VITAMIN D PO) Take 5,000 Units by mouth daily.  Marland Kitchen levothyroxine (SYNTHROID, LEVOTHROID) 50 MCG tablet take 1 to 1 and 1/2 tablet by mouth once daily  . potassium chloride SA (K-DUR,KLOR-CON) 20 MEQ tablet TAKE 1 TABLET BY MOUTH THREE TIMES DAILY  . PROAIR HFA 108 (90 Base) MCG/ACT inhaler inhale 2 puffs by mouth every 6 hours if needed for wheezing or shortness of breath  . ranitidine (ZANTAC) 300 MG tablet TAKE 1 TO 2 TABLETS BY MOUTH EVERY DAY FOR HEARTBURN AND REFLUX TO ALLOW WEAN AND TRANSITION FROM PANTOPRAZOLE  . Venlafaxine HCl (EFFEXOR PO) Take by mouth.  . ALPRAZolam (XANAX) 1 MG tablet take 1/2 to 1 tablets by mouth three times a day to four times a day  . Flaxseed, Linseed, (FLAX SEED OIL) 1300 MG CAPS Take 1,300 mg by mouth 3 (three) times daily.  Marland Kitchen FLUoxetine (PROZAC) 40 MG  capsule tke 1 capsule daily for Mood  . Omega-3 Fatty Acids (FISH OIL) 1000 MG CAPS Take 1 capsule (1,000 mg total) by mouth 3 (three) times daily.   No current facility-administered medications on file prior to visit.    Allergies  Allergen Reactions  . Codeine Other (See Comments)    Abdominal pain  . Erythromycin Base     GI upset  . Penicillins Rash   Past Medical History:  Diagnosis Date  . Allergy   . Anxiety   . Arthritis   . Asthma    as a child, out grew   . Blood transfusion without reported diagnosis   . Cancer ([place])    basal cell left temple, left side of nose  .  Depression   . GERD (gastroesophageal reflux disease)   . Hyperlipidemia   . Hypertension   . Thyroid disease    hypo   Health Maintenance  Topic Date Due  . Hepatitis C Screening  [birth year]  . HIV Screening  12/12/1967  . MAMMOGRAM  03/31/2012  . COLONOSCOPY  04/13/2017  . DEXA SCAN  12/11/2017  . PNA vac Low Risk Adult (1 of 2 - PCV13) 12/11/2017  . PAP SMEAR  01/26/2018  . INFLUENZA VACCINE  04/25/2018  . TETANUS/TDAP  11/30/2025   Immunization History  Administered Date(s) Administered  . Influenza Split 06/22/2015  . Influenza,inj,quad, With Preservative 07/05/2016  . Influenza-Unspecified 07/22/2017  . Pneumococcal-Unspecified 09/25/2004  . Td 09/25/2004  . Tdap 12/01/2015   Last Colon - 04/13/2014 - Dr Hilarie Fredrickson - recc 3 yr f/u due July 2018 - Patient aware overdue.  Last MGM - 03/31/76  (s/p Bilat sub mastectomy)   Past Surgical History:  Procedure Laterality Date  . APPENDECTOMY    . bil breast lumpectomy    . bilateral breast masectomy  no breast cancer   bil breast removed to prevent ca.  Marland Kitchen BREAST RECONSTRUCTION  2008  . BREAST SURGERY Bilateral 1983   implants  . CHOLECYSTECTOMY  1975  . EUS N/A 04/23/2014   Procedure: LOWER ENDOSCOPIC ULTRASOUND (EUS);  Surgeon: Milus Banister, MD;  Location: [facility];  Service: Endoscopy;  Laterality: N/A;  . LIPOSUCTION  1998   with tummy tuck  . NASAL SINUS SURGERY  1996   Family History  Problem Relation Age of Onset  . COPD Mother   . Lung cancer Mother   . Emphysema Mother   . Depression Mother   . Dementia Mother   . Early death Father        murdered at 42  . Alcohol abuse Father   . Early death Sister        suicide at 41  . Anxiety disorder Sister   . Depression Sister   . Schizophrenia Sister   . Diabetes Sister   . Alcohol abuse Paternal Uncle   . ADD / ADHD Son   . Colon cancer Neg Hx   . Esophageal cancer Neg Hx   . Pancreatic cancer Neg Hx   . Rectal cancer Neg Hx   . Stomach cancer Neg  Hx    Social History   Tobacco Use  . Smoking status: Current Some Day Smoker    Packs/day: 0.50    Types: E-cigarettes  . Smokeless tobacco: Never Used  . Tobacco comment: Uses E-Cigs and trying to quit completely   Substance Use Topics  . Alcohol use: Yes    Alcohol/week: 4.0 - 5.0 standard drinks    Types:  4 - 5 Shots of liquor per week    Comment: occasional  . Drug use: No    ROS Constitutional: Denies fever, chills, weight loss/gain, headaches, insomnia,  night sweats, and change in appetite. Does c/o fatigue. Eyes: Denies redness, blurred vision, diplopia, discharge, itchy, watery eyes.  ENT: Denies discharge, congestion, post nasal drip, epistaxis, sore throat, earache, hearing loss, dental pain, Tinnitus, Vertigo, Sinus pain, snoring.  Cardio: Denies chest pain, palpitations, irregular heartbeat, syncope, dyspnea, diaphoresis, orthopnea, PND, claudication, edema Respiratory: denies cough, dyspnea, DOE, pleurisy, hoarseness, laryngitis, wheezing.  Gastrointestinal: Denies dysphagia, heartburn, reflux, water brash, pain, cramps, nausea, vomiting, bloating, diarrhea, constipation, hematemesis, melena, hematochezia, jaundice, hemorrhoids Genitourinary: Denies dysuria, frequency, urgency, nocturia, hesitancy, discharge, hematuria, flank pain Breast: Breast lumps, nipple discharge, bleeding.  Musculoskeletal: Denies arthralgia, myalgia, stiffness, Jt. Swelling, pain, limp, and strain/sprain. Denies falls. Skin: Denies puritis, rash, hives, warts, acne, eczema, changing in skin lesion Neuro: No weakness, tremor, incoordination, spasms, paresthesia, pain Psychiatric: Denies confusion, memory loss, sensory loss. Denies Depression. Endocrine: Denies change in weight, skin, hair change, nocturia, and paresthesia, diabetic polys, visual blurring, hyper / hypo glycemic episodes.  Heme/Lymph: No excessive bleeding, bruising, enlarged lymph nodes.  Physical Exam  BP 110/78   Pulse 87    Temp (!) 97.2 F (36.2 C)   Ht 5' 3.25" (1.607 m)   Wt 135 lb (61.2 kg)   SpO2 97%   BMI 23.73 kg/m   General Appearance: Well nourished, well groomed and in no apparent distress.  Eyes: PERRLA, EOMs, conjunctiva no swelling or erythema, normal fundi and vessels. Sinuses: No frontal/maxillary tenderness ENT/Mouth: EACs patent / TMs  nl. Nares clear without erythema, swelling, mucoid exudates. Oral hygiene is good. No erythema, swelling, or exudate. Tongue normal, non-obstructing. Tonsils not swollen or erythematous. Hearing normal.  Neck: Supple, thyroid not palpable. No bruits, nodes or JVD. Respiratory: Respiratory effort normal.  BS equal and clear bilateral without rales, rhonci, wheezing or stridor. Cardio: Heart sounds are normal with regular rate and rhythm and no murmurs, rubs or gallops. Peripheral pulses are normal and equal bilaterally without edema. No aortic or femoral bruits. Chest: symmetric with normal excursions and percussion. Breasts: Symmetric, without lumps, nipple discharge, retractions, or fibrocystic changes.  Abdomen: Flat, soft with bowel sounds active. Nontender, no guarding, rebound, hernias, masses, or organomegaly.  Lymphatics: Non tender without lymphadenopathy.  Genitourinary:  Musculoskeletal: Full ROM all peripheral extremities, joint stability, 5/5 strength, and normal gait. Skin: Warm and dry without rashes, lesions, cyanosis, clubbing or  ecchymosis.  Neuro: Cranial nerves intact, reflexes equal bilaterally. Normal muscle tone, no cerebellar symptoms. Sensation intact.  Pysch: Alert and oriented X 3, normal affect, Insight and Judgment appropriate.   Assessment and Plan 1. Essential hypertension  - EKG 12-Lead - Urinalysis, Routine w reflex microscopic - Microalbumin / creatinine urine ratio - CBC with Differential/Platelet - COMPLETE METABOLIC PANEL WITH GFR - Magnesium - TSH  2. Hyperlipidemia, mixed  - EKG 12-Lead - Lipid panel -  TSH  3. Abnormal glucose  - EKG 12-Lead - Hemoglobin A1c - Insulin, random  4. Vitamin D deficiency  - VITAMIN D 25 Hydroxyl  5. Prediabetes  - EKG 12-Lead - Hemoglobin A1c - Insulin, random  6. Hypothyroidism  - TSH  7. Severe recurrent major depression without psychotic features ([place])  8. Gastroesophageal reflux disease  - CBC with Differential/Platelet  9. Attention deficit hyperactivity disorder (ADHD)   10. Screening for colorectal cancer  - POC Hemoccult Bld/Stl  11. Screening  for ischemic heart disease  - EKG 12-Lead  12. FHx: heart disease  - EKG 12-Lead  13. Smoker  - EKG 12-Lead  14. Medication management  - Urinalysis, Routine w reflex microscopic - Microalbumin / creatinine urine ratio - CBC with Differential/Platelet - COMPLETE METABOLIC PANEL WITH GFR - Magnesium - Lipid panel - TSH - Hemoglobin A1c - Insulin, random - VITAMIN D 25 Hydroxyl        Patient was counseled in prudent diet to achieve/maintain BMI less than 25 for weight control, BP monitoring, regular exercise and medications. Discussed med's effects and SE's. Screening labs and tests as requested with regular follow-up as recommended. Over 40 minutes of exam, counseling, chart review and high complex critical decision making was performed.

## 2018-05-22 LAB — MICROALBUMIN / CREATININE URINE RATIO
Creatinine, Urine: 117 mg/dL (ref 20–275)
Microalb Creat Ratio: 6 mcg/mg creat (ref <30)
Microalb, Ur: 0.7 mg/dL

## 2018-05-22 LAB — COMPLETE METABOLIC PANEL WITH GFR
AG Ratio: 1.7 (calc) (ref 1.0–2.5)
ALT: 16 U/L (ref 6–29)
AST: 15 U/L (ref 10–35)
Albumin: 4.3 g/dL (ref 3.6–5.1)
Alkaline phosphatase (APISO): 92 U/L (ref 33–130)
BUN: 17 mg/dL (ref 7–25)
CO2: 31 mmol/L (ref 20–32)
Calcium: 9.6 mg/dL (ref 8.6–10.4)
Chloride: 100 mmol/L (ref 98–110)
Creat: 0.77 mg/dL (ref 0.50–0.99)
GFR, Est African American: 94 mL/min/{1.73_m2} (ref >=60)
GFR, Est Non African American: 81 mL/min/{1.73_m2} (ref >=60)
Globulin: 2.5 g/dL (calc) (ref 1.9–3.7)
Glucose, Bld: 88 mg/dL (ref 65–99)
Potassium: 3.8 mmol/L (ref 3.5–5.3)
Sodium: 142 mmol/L (ref 135–146)
Total Bilirubin: 0.4 mg/dL (ref 0.2–1.2)
Total Protein: 6.8 g/dL (ref 6.1–8.1)

## 2018-05-22 LAB — URINALYSIS, ROUTINE W REFLEX MICROSCOPIC
Bacteria, UA: NONE SEEN /HPF
Bilirubin Urine: NEGATIVE
Glucose, UA: NEGATIVE
Hgb urine dipstick: NEGATIVE
Hyaline Cast: NONE SEEN /LPF
Ketones, ur: NEGATIVE
Nitrite: NEGATIVE
Protein, ur: NEGATIVE
Specific Gravity, Urine: 1.021 (ref 1.001–1.03)
pH: 5.5 (ref 5.0–8.0)

## 2018-05-22 LAB — CBC WITH DIFFERENTIAL/PLATELET
Basophils Absolute: 71 cells/uL (ref 0–200)
Basophils Relative: 0.9 %
Eosinophils Absolute: 221 cells/uL (ref 15–500)
Eosinophils Relative: 2.8 %
HCT: 37.8 % (ref 35.0–45.0)
Hemoglobin: 12.4 g/dL (ref 11.7–15.5)
Lymphs Abs: 3808 cells/uL (ref 850–3900)
MCH: 30.4 pg (ref 27.0–33.0)
MCHC: 32.8 g/dL (ref 32.0–36.0)
MCV: 92.6 fL (ref 80.0–100.0)
MPV: 10.9 fL (ref 7.5–12.5)
Monocytes Relative: 6 %
Neutro Abs: 3326 cells/uL (ref 1500–7800)
Neutrophils Relative %: 42.1 %
Platelets: 341 10*3/uL (ref 140–400)
RBC: 4.08 10*6/uL (ref 3.80–5.10)
RDW: 12.7 % (ref 11.0–15.0)
Total Lymphocyte: 48.2 %
WBC mixed population: 474 cells/uL (ref 200–950)
WBC: 7.9 10*3/uL (ref 3.8–10.8)

## 2018-05-22 LAB — LIPID PANEL
Cholesterol: 171 mg/dL (ref <200)
HDL: 58 mg/dL (ref >50)
LDL Cholesterol (Calc): 85 mg/dL (calc)
Non-HDL Cholesterol (Calc): 113 mg/dL (calc) (ref <130)
Total CHOL/HDL Ratio: 2.9 (calc) (ref <5.0)
Triglycerides: 186 mg/dL — ABNORMAL HIGH (ref <150)

## 2018-05-22 LAB — MAGNESIUM: Magnesium: 1.7 mg/dL (ref 1.5–2.5)

## 2018-05-22 LAB — HEMOGLOBIN A1C
Hgb A1c MFr Bld: 5.4 % of total Hgb (ref <5.7)
Mean Plasma Glucose: 108 (calc)
eAG (mmol/L): 6 (calc)

## 2018-05-22 LAB — TSH: TSH: 0.43 mIU/L (ref 0.40–4.50)

## 2018-05-22 LAB — INSULIN, RANDOM: Insulin: 1.3 u[IU]/mL — ABNORMAL LOW (ref 2.0–19.6)

## 2018-05-22 LAB — VITAMIN D 25 HYDROXY (VIT D DEFICIENCY, FRACTURES): Vit D, 25-Hydroxy: 35 ng/mL (ref 30–100)

## 2018-05-23 ENCOUNTER — Other Ambulatory Visit: Payer: Self-pay

## 2018-05-23 DIAGNOSIS — F909 Attention-deficit hyperactivity disorder, unspecified type: Secondary | ICD-10-CM

## 2018-05-23 MED ORDER — METHYLPHENIDATE HCL 20 MG PO TABS: ORAL_TABLET | ORAL | 0 refills | Status: DC

## 2018-05-23 NOTE — Telephone Encounter (Signed)
Ritalin refill request. PMP checked, last filled on 04/22/18, #60, 30 day supply. Last office visit on 05/21/18, next office visit on 08/27/18. In que for your review.

## 2018-05-27 ENCOUNTER — Encounter: Payer: Self-pay | Admitting: Internal Medicine

## 2018-06-03 ENCOUNTER — Other Ambulatory Visit: Payer: Self-pay | Admitting: Internal Medicine

## 2018-06-10 DIAGNOSIS — M659 Synovitis and tenosynovitis, unspecified: Secondary | ICD-10-CM | POA: Diagnosis not present

## 2018-06-10 DIAGNOSIS — M7671 Peroneal tendinitis, right leg: Secondary | ICD-10-CM | POA: Diagnosis not present

## 2018-06-10 DIAGNOSIS — M19071 Primary osteoarthritis, right ankle and foot: Secondary | ICD-10-CM | POA: Diagnosis not present

## 2018-06-13 DIAGNOSIS — M7671 Peroneal tendinitis, right leg: Secondary | ICD-10-CM | POA: Diagnosis not present

## 2018-06-13 DIAGNOSIS — M659 Synovitis and tenosynovitis, unspecified: Secondary | ICD-10-CM | POA: Diagnosis not present

## 2018-06-20 ENCOUNTER — Other Ambulatory Visit: Payer: Self-pay | Admitting: Internal Medicine

## 2018-06-20 DIAGNOSIS — M659 Synovitis and tenosynovitis, unspecified: Secondary | ICD-10-CM | POA: Diagnosis not present

## 2018-06-20 DIAGNOSIS — M19071 Primary osteoarthritis, right ankle and foot: Secondary | ICD-10-CM | POA: Diagnosis not present

## 2018-06-20 DIAGNOSIS — F909 Attention-deficit hyperactivity disorder, unspecified type: Secondary | ICD-10-CM

## 2018-06-20 DIAGNOSIS — M7671 Peroneal tendinitis, right leg: Secondary | ICD-10-CM | POA: Diagnosis not present

## 2018-06-20 MED ORDER — METHYLPHENIDATE HCL 20 MG PO TABS: ORAL_TABLET | ORAL | 0 refills | Status: DC

## 2018-06-27 DIAGNOSIS — M659 Synovitis and tenosynovitis, unspecified: Secondary | ICD-10-CM | POA: Diagnosis not present

## 2018-06-29 ENCOUNTER — Other Ambulatory Visit: Payer: Self-pay | Admitting: Internal Medicine

## 2018-06-29 DIAGNOSIS — R609 Edema, unspecified: Secondary | ICD-10-CM

## 2018-06-29 DIAGNOSIS — I10 Essential (primary) hypertension: Secondary | ICD-10-CM

## 2018-07-01 DIAGNOSIS — F330 Major depressive disorder, recurrent, mild: Secondary | ICD-10-CM | POA: Diagnosis not present

## 2018-07-03 ENCOUNTER — Other Ambulatory Visit: Payer: Self-pay | Admitting: Internal Medicine

## 2018-07-03 DIAGNOSIS — E079 Disorder of thyroid, unspecified: Secondary | ICD-10-CM

## 2018-07-09 ENCOUNTER — Ambulatory Visit (INDEPENDENT_AMBULATORY_CARE_PROVIDER_SITE_OTHER): Payer: Medicare Other

## 2018-07-09 DIAGNOSIS — E079 Disorder of thyroid, unspecified: Secondary | ICD-10-CM | POA: Diagnosis not present

## 2018-07-09 NOTE — Progress Notes (Signed)
Patient presents to the office for a nurse visit to have TSH lab drawn. Currently taking Levothyroxine, 96mcg, 1.5 tablets daily. Patient is concerned about weight gain, 10-15lbs within the last month. Patient states that she has had to buy two sizes bigger in clothing. Feels that she has gained weight all over. She is under a lot of stress but that has been an ongoing issue.

## 2018-07-10 LAB — TSH: TSH: 2.63 mIU/L (ref 0.40–4.50)

## 2018-07-11 DIAGNOSIS — M84374A Stress fracture, right foot, initial encounter for fracture: Secondary | ICD-10-CM | POA: Diagnosis not present

## 2018-07-11 DIAGNOSIS — M67371 Transient synovitis, right ankle and foot: Secondary | ICD-10-CM | POA: Diagnosis not present

## 2018-07-16 ENCOUNTER — Ambulatory Visit (INDEPENDENT_AMBULATORY_CARE_PROVIDER_SITE_OTHER): Payer: Medicare Other

## 2018-07-16 ENCOUNTER — Ambulatory Visit: Payer: Self-pay

## 2018-07-16 VITALS — Temp 97.3°F

## 2018-07-16 DIAGNOSIS — Z23 Encounter for immunization: Secondary | ICD-10-CM

## 2018-07-18 DIAGNOSIS — M84374D Stress fracture, right foot, subsequent encounter for fracture with routine healing: Secondary | ICD-10-CM | POA: Diagnosis not present

## 2018-07-23 ENCOUNTER — Other Ambulatory Visit: Payer: Self-pay | Admitting: Internal Medicine

## 2018-07-23 DIAGNOSIS — F909 Attention-deficit hyperactivity disorder, unspecified type: Secondary | ICD-10-CM

## 2018-07-23 MED ORDER — METHYLPHENIDATE HCL 20 MG PO TABS: ORAL_TABLET | ORAL | 0 refills | Status: DC

## 2018-08-01 ENCOUNTER — Encounter: Payer: Self-pay | Admitting: *Deleted

## 2018-08-06 ENCOUNTER — Other Ambulatory Visit: Payer: Self-pay | Admitting: Internal Medicine

## 2018-08-06 DIAGNOSIS — R609 Edema, unspecified: Secondary | ICD-10-CM

## 2018-08-06 DIAGNOSIS — I10 Essential (primary) hypertension: Secondary | ICD-10-CM

## 2018-08-07 ENCOUNTER — Ambulatory Visit: Payer: Self-pay | Admitting: Internal Medicine

## 2018-08-08 DIAGNOSIS — D3132 Benign neoplasm of left choroid: Secondary | ICD-10-CM | POA: Diagnosis not present

## 2018-08-08 DIAGNOSIS — H2513 Age-related nuclear cataract, bilateral: Secondary | ICD-10-CM | POA: Diagnosis not present

## 2018-08-08 DIAGNOSIS — H04123 Dry eye syndrome of bilateral lacrimal glands: Secondary | ICD-10-CM | POA: Diagnosis not present

## 2018-08-09 DIAGNOSIS — M84374D Stress fracture, right foot, subsequent encounter for fracture with routine healing: Secondary | ICD-10-CM | POA: Diagnosis not present

## 2018-08-20 ENCOUNTER — Telehealth: Payer: Self-pay | Admitting: Internal Medicine

## 2018-08-20 ENCOUNTER — Other Ambulatory Visit: Payer: Self-pay | Admitting: Internal Medicine

## 2018-08-20 NOTE — Telephone Encounter (Signed)
Patient called to request  Refill for Ritlan to [facility]

## 2018-08-21 ENCOUNTER — Other Ambulatory Visit: Payer: Self-pay | Admitting: Internal Medicine

## 2018-08-21 DIAGNOSIS — F909 Attention-deficit hyperactivity disorder, unspecified type: Secondary | ICD-10-CM

## 2018-08-21 MED ORDER — METHYLPHENIDATE HCL 20 MG PO TABS: ORAL_TABLET | ORAL | 0 refills | Status: DC

## 2018-08-26 DIAGNOSIS — M84374D Stress fracture, right foot, subsequent encounter for fracture with routine healing: Secondary | ICD-10-CM | POA: Diagnosis not present

## 2018-08-27 ENCOUNTER — Ambulatory Visit: Payer: Self-pay | Admitting: Adult Health

## 2018-08-27 ENCOUNTER — Ambulatory Visit: Payer: Self-pay | Admitting: Adult Health Nurse Practitioner

## 2018-09-01 ENCOUNTER — Other Ambulatory Visit: Payer: Self-pay | Admitting: Internal Medicine

## 2018-09-09 NOTE — Progress Notes (Deleted)
FOLLOW UP  Assessment and Plan:    Diagnoses and all orders for this visit:  Essential hypertension  Extrinsic asthma without complication, unspecified asthma severity, unspecified whether persistent  Gastroesophageal reflux disease, esophagitis presence not specified  Hypothyroidism  Hyperlipidemia, mixed  Vitamin D deficiency  Medication management  Smoker  Body mass index (BMI) of 23.0-23.9 in adult  Severe recurrent major depression without psychotic features ([place])    Hypertension Well controlled with current medications  Monitor blood pressure at home; patient to call if consistently greater than 130/80 Continue DASH diet.   Reminder to go to the ER if any CP, SOB, nausea, dizziness, severe HA, changes vision/speech, left arm numbness and tingling and jaw pain.  Cholesterol Currently at goal;  Continue low cholesterol diet and exercise.  Check lipid panel.   Diabetes {with or without complications:30421263} Continue medication: Continue diet and exercise.  Perform daily foot/skin check, notify office of any concerning changes.  Check A1C  Obesity with co morbidities Long discussion about weight loss, diet, and exercise Recommended diet heavy in fruits and veggies and low in animal meats, cheeses, and dairy products, appropriate calorie intake Discussed ideal weight for height (below ***) and initial weight goal (***) Patient will work on *** Will follow up in 3 months  Vitamin D Def At goal at last visit; continue supplementation to maintain goal of 70-100 Defer Vit D level  Continue diet and meds as discussed. Further disposition pending results of labs. Discussed med's effects and SE's.   Over 30 minutes of exam, counseling, chart review, and critical decision making was performed.   Future Appointments  Date Time Provider [place]  09/10/2018  2:00 PM Garnet Sierras, NP [facility] None  11/27/2018  2:30 PM Unk Pinto, MD  [facility] None  06/24/2019  2:00 PM Unk Pinto, MD [facility] None    ----------------------------------------------------------------------------------------------------------------------  HPI 67 y.o. female  presents for 3 month follow up on hypertension, cholesterol, diabetes, weight and vitamin D deficiency.   BMI is There is no height or weight on file to calculate BMI., she {HAS HAS YTK:35465} been working on diet and exercise. Wt Readings from Last 3 Encounters:  05/21/18 135 lb (61.2 kg)  12/20/17 139 lb 6.4 oz (63.2 kg)  11/13/17 133 lb 6.4 oz (60.5 kg)    Her blood pressure {HAS HAS NOT:18834} been controlled at home, today their BP is    She {DOES_DOES KCL:27517} workout. She denies chest pain, shortness of breath, dizziness.   She {ACTION; IS/IS GYF:74944967} on cholesterol medication {Cholesterol meds:21887} and denies myalgias. Her cholesterol {ACTION; IS/IS NOT:21021397} at goal. The cholesterol last visit was:   Lab Results  Component Value Date   CHOL 171 05/21/2018   HDL 58 05/21/2018   LDLCALC 85 05/21/2018   TRIG 186 (H) 05/21/2018   CHOLHDL 2.9 05/21/2018    She {Has/has not:18111} been working on diet and exercise for prediabetes, and denies {Symptoms; diabetes w/o none:19199}. Last A1C in the office was:  Lab Results  Component Value Date   HGBA1C 5.4 05/21/2018   Patient is on Vitamin D supplement.   Lab Results  Component Value Date   VD25OH 35 05/21/2018        Current Medications:  Current Outpatient Medications on File Prior to Visit  Medication Sig  . ALPRAZolam (XANAX) 1 MG tablet take 1/2 to 1 tablets by mouth three times a day to four times a day  . atorvastatin (LIPITOR) 80 MG tablet TAKE 1/2 TO 1 TABLET  BY MOUTH ONCE DAILY IF NEEDED FOR CHOLESTEROL  . BREO ELLIPTA 200-25 MCG/INH AEPB inhale 1 puff INTO THE LUNGS DAILY  . bumetanide (BUMEX) 2 MG tablet TAKE 1 TABLET(2 MG) BY MOUTH TWICE DAILY  . bumetanide (BUMEX) 2 MG tablet TAKE  1 TABLET(2 MG) BY MOUTH TWICE DAILY  . buPROPion (WELLBUTRIN XL) 300 MG 24 hr tablet take 1 tablet by mouth once daily (Patient taking differently: Take 150 mg by mouth 3 (three) times daily. )  . Cholecalciferol (VITAMIN D PO) Take 5,000 Units by mouth daily.  . Flaxseed, Linseed, (FLAX SEED OIL) 1300 MG CAPS Take 1,300 mg by mouth 3 (three) times daily.  Marland Kitchen FLUoxetine (PROZAC) 40 MG capsule tke 1 capsule daily for Mood  . levothyroxine (SYNTHROID, LEVOTHROID) 50 MCG tablet TAKE 1 TO 1 AND 1/2 TABLETS BY MOUTH EVERY DAY  . methylphenidate (RITALIN) 20 MG tablet Take 1/2-1 tablet 2 or 3 x /day as directed for depression.  Do not take more than 5 days per week . Should last longer than 30 days.  . Omega-3 Fatty Acids (FISH OIL) 1000 MG CAPS Take 1 capsule (1,000 mg total) by mouth 3 (three) times daily.  . potassium chloride SA (K-DUR,KLOR-CON) 20 MEQ tablet TAKE 1 TABLET BY MOUTH THREE TIMES DAILY  . PROAIR HFA 108 (90 Base) MCG/ACT inhaler inhale 2 puffs by mouth every 6 hours if needed for wheezing or shortness of breath  . ranitidine (ZANTAC) 300 MG tablet TAKE 1 TO 2 TABLETS BY MOUTH EVERY DAY FOR HEARTBURN AND REFLUX TO ALLOW WEAN AND TRANSITION FROM PANTOPRAZOLE  . Venlafaxine HCl (EFFEXOR PO) Take by mouth.   No current facility-administered medications on file prior to visit.      Allergies:  Allergies  Allergen Reactions  . Codeine Other (See Comments)    Abdominal pain  . Erythromycin Base     GI upset  . Penicillins Rash     Medical History:  Past Medical History:  Diagnosis Date  . Allergy   . Anxiety   . Arthritis   . Asthma    as a child, out grew   . Blood transfusion without reported diagnosis   . Cancer ([place])    basal cell left temple, left side of nose  . Depression   . GERD (gastroesophageal reflux disease)   . Hyperlipidemia   . Hypertension   . Thyroid disease    hypo  . Tubular adenoma of colon    Family history- Reviewed and unchanged Social  history- Reviewed and unchanged   Review of Systems:  ROS    Physical Exam: There were no vitals taken for this visit. Wt Readings from Last 3 Encounters:  05/21/18 135 lb (61.2 kg)  12/20/17 139 lb 6.4 oz (63.2 kg)  11/13/17 133 lb 6.4 oz (60.5 kg)   General Appearance: Well nourished, in no apparent distress. Eyes: PERRLA, EOMs, conjunctiva no swelling or erythema Sinuses: No Frontal/maxillary tenderness ENT/Mouth: Ext aud canals clear, TMs without erythema, bulging. No erythema, swelling, or exudate on post pharynx.  Tonsils not swollen or erythematous. Hearing normal.  Neck: Supple, thyroid normal.  Respiratory: Respiratory effort normal, BS equal bilaterally without rales, rhonchi, wheezing or stridor.  Cardio: RRR with no MRGs. Brisk peripheral pulses without edema.  Abdomen: Soft, + BS.  Non tender, no guarding, rebound, hernias, masses. Lymphatics: Non tender without lymphadenopathy.  Musculoskeletal: Full ROM, 5/5 strength, {PSY - GAIT AND STATION:22860} gait Skin: Warm, dry without rashes, lesions, ecchymosis.  Neuro:  Cranial nerves intact. No cerebellar symptoms.  Psych: Awake and oriented X 3, normal affect, Insight and Judgment appropriate.    Garnet Sierras, NP 9:13 PM [facility]

## 2018-09-10 ENCOUNTER — Ambulatory Visit: Payer: Self-pay | Admitting: Adult Health Nurse Practitioner

## 2018-09-23 DIAGNOSIS — L570 Actinic keratosis: Secondary | ICD-10-CM | POA: Diagnosis not present

## 2018-09-23 DIAGNOSIS — X32XXXD Exposure to sunlight, subsequent encounter: Secondary | ICD-10-CM | POA: Diagnosis not present

## 2018-09-26 ENCOUNTER — Other Ambulatory Visit: Payer: Self-pay | Admitting: Internal Medicine

## 2018-09-26 DIAGNOSIS — M84374D Stress fracture, right foot, subsequent encounter for fracture with routine healing: Secondary | ICD-10-CM | POA: Diagnosis not present

## 2018-09-26 DIAGNOSIS — F909 Attention-deficit hyperactivity disorder, unspecified type: Secondary | ICD-10-CM

## 2018-09-26 MED ORDER — METHYLPHENIDATE HCL 20 MG PO TABS: ORAL_TABLET | ORAL | 0 refills | Status: DC

## 2018-09-27 ENCOUNTER — Other Ambulatory Visit: Payer: Self-pay | Admitting: Internal Medicine

## 2018-09-27 DIAGNOSIS — I10 Essential (primary) hypertension: Secondary | ICD-10-CM

## 2018-09-27 DIAGNOSIS — R609 Edema, unspecified: Secondary | ICD-10-CM

## 2018-10-01 DIAGNOSIS — F330 Major depressive disorder, recurrent, mild: Secondary | ICD-10-CM | POA: Diagnosis not present

## 2018-10-30 ENCOUNTER — Other Ambulatory Visit: Payer: Self-pay | Admitting: Internal Medicine

## 2018-11-04 ENCOUNTER — Other Ambulatory Visit: Payer: Self-pay | Admitting: Internal Medicine

## 2018-11-04 DIAGNOSIS — F909 Attention-deficit hyperactivity disorder, unspecified type: Secondary | ICD-10-CM

## 2018-11-04 MED ORDER — METHYLPHENIDATE HCL 20 MG PO TABS: ORAL_TABLET | ORAL | 0 refills | Status: DC

## 2018-11-27 ENCOUNTER — Ambulatory Visit: Payer: Self-pay | Admitting: Internal Medicine

## 2018-12-02 NOTE — Progress Notes (Signed)
MEDICARE ANNUAL WELLNESS VISIT AND FOLLOW UP  Assessment:   Margaret Nelson was seen today for medicare wellness.  Diagnoses and all orders for this visit:  Encounter for Medicare annual wellness exam Yearly Essential hypertension Discussed dietary and exercise modifications -     CBC with Differential/Platelet -     COMPLETE METABOLIC PANEL WITH GFR -     Urinalysis w microscopic + reflex cultur  Extrinsic asthma without complication, unspecified asthma severity, unspecified whether persistent Has rescue inhaler, has not had to use this recently, in date. Continue Breo daily  Gastroesophageal reflux disease, esophagitis presence not specified Continue zantac with benefit -     Magnesium  Hypothyroidism Continue same dose -     TSH  Attention deficit hyperactivity disorder (ADHD), unspecified ADHD type Taking ritalin Doing well with this  Depression, major, recurrent, in partial remission (HCC) Taking Wellbutrin and venlafaxine daily Continue with benefit  Hyperlipidemia, mixed Taking Lipitor 80mg , half tablet daily -     Lipid panel  Severe recurrent major depression without psychotic features ([place]) Continue follow up with this Continue current medication regiment  Herpes simplex -     valACYclovir (VALTREX) 500 MG tablet; Take two tablets by mouth twice a day at onset of symptoms. Asymptomatic at this time  Vitamin D deficiency -     VITAMIN D 25 Hydroxy (Vit-D Deficiency, Fractures)  Abnormal glucose Discussed dietary and exercise modifications -     Hemoglobin A1c -     Insulin, random  Smoker Discussed smoking cessation and resources Not ready to quit at this time Will continue to assess readiness   Body mass index (BMI) of 23.0-23.9 in adult Discussed dietary and exercise modifications  Need for vaccination against Streptococcus pneumoniae using pneumococcal conjugate vaccine 13 -     Pneumococcal conjugate vaccine 13-valent  Medication management -      CBC with Differential/Platelet -     COMPLETE METABOLIC PANEL WITH GFR -     Magnesium -     Lipid panel -     TSH -     Hemoglobin A1c -     Insulin, random -     VITAMIN D 25 Hydroxy (Vit-D Deficiency, Fractures) -     Urinalysis w microscopic + reflex cultur  Abnormal urine finding -     REFLEXIVE URINE CULTURE    Over 40 minutes of exam, counseling, chart review and critical decision making was performed Future Appointments  Date Time Provider [place]  06/24/2019  2:00 PM Unk Pinto, MD [id number] None     Plan:   During the course of the visit the patient was educated and counseled about appropriate screening and preventive services including:    Pneumococcal vaccine   Prevnar 13  Influenza vaccine  Td vaccine  Screening electrocardiogram  Bone densitometry screening  Colorectal cancer screening  Diabetes screening  Glaucoma screening  Nutrition counseling   Advanced directives: requested   Subjective:  Margaret Nelson is a 68 y.o. female who presents for Medicare Annual Wellness Visit and 3 month follow up.   This very nice 68 y.o. DWF presents for an AWV and 3 month follow up for management of multiple medical co-morbidities.  Patient has been followed for HTN, HLD, Prediabetes  and Vitamin D Deficiency. Patient has GERD controlled on her meds. Patient has been on thyroid replacement for many years. Reports she is doing well since last visit.  She has no health concerns although she would like a refill  for valtrex related to intermittent flare ups, usually related to stress.                                         Patient is on SS disability for a long hx/o severe Depression circa 1990's w/hx/o ECT x 9 in 2008. She was seeing Dr Jeneen Montgomery.  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  She reports that this is chronic and  remains in this situation.   Reports she has been putting on weight continuously.  She has gained 11 pounds in approximately 7 months. She has not been monitoring her diet or exercising.                             She has had elevated blood pressure for 12 years, since 2008. Her blood pressure has been controlled at home, today their BP is BP: 112/66 She does not workout. She denies chest pain, shortness of breath, dizziness.  She is on cholesterol medication and denies myalgias. Her cholesterol is not at goal. The cholesterol last visit was:   Lab Results  Component Value Date   CHOL 171 05/21/2018   HDL 58 05/21/2018   LDLCALC 85 05/21/2018   TRIG 186 (H) 05/21/2018   CHOLHDL 2.9 05/21/2018   She has had pre diabetes with elevated A1c of 5.7 in 2012 and highest at 5.9 in 2016. She has not been working on diet and exercise for prediabetes/abnormal glucose, and denies hyperglycemia, hypoglycemia , nausea, polydipsia and polyuria. Last A1C in the office was:  Lab Results  Component Value Date   HGBA1C 5.4 05/21/2018   Last GFR:   Lab Results  Component Value Date   GFRNONAA 81 05/21/2018   Lab Results  Component Value Date   GFRAA 94 05/21/2018   Patient is on Vitamin D supplement.   Lab Results  Component Value Date   VD25OH 35 05/21/2018      Medication Review: Current Outpatient Medications on File Prior to Visit  Medication Sig Dispense Refill  . ALPRAZolam (XANAX) 1 MG tablet take 1/2 to 1 tablets by mouth three times a day to four times a day 120 tablet 1  . atorvastatin (LIPITOR) 80 MG tablet TAKE 1/2 TO 1 TABLET BY MOUTH ONCE DAILY IF NEEDED FOR CHOLESTEROL 30 tablet 0  . BREO ELLIPTA 200-25 MCG/INH AEPB inhale 1 puff INTO THE LUNGS DAILY 60 each 11  . bumetanide (BUMEX) 2 MG tablet TAKE 1 TABLET(2 MG) BY MOUTH TWICE DAILY 180 tablet 0  . buPROPion (WELLBUTRIN XL) 300 MG 24 hr tablet take 1 tablet by mouth once daily (Patient taking differently: Take 150 mg by  mouth 3 (three) times daily. ) 90 tablet 1  . Cholecalciferol (VITAMIN D PO) Take 5,000 Units by mouth daily.    . Flaxseed, Linseed, (FLAX SEED OIL) 1300 MG CAPS Take 1,300 mg by mouth 3 (three) times daily.    Marland Kitchen levothyroxine (SYNTHROID, LEVOTHROID) 50 MCG tablet TAKE 1 TO 1 AND 1/2 TABLETS BY MOUTH EVERY DAY 90 tablet 1  . methylphenidate (RITALIN) 20 MG tablet Take 1/2-1 tablet 2  x /day as directed for depression.  Do not take more than 5 days per week . Should last longer  than 30 days. 60 tablet 0  . Omega-3 Fatty Acids (FISH OIL) 1000 MG CAPS Take 1 capsule (1,000 mg total) by mouth 3 (three) times daily.  0  . potassium chloride SA (K-DUR,KLOR-CON) 20 MEQ tablet TAKE 1 TABLET BY MOUTH THREE TIMES DAILY 270 tablet 1  . PROAIR HFA 108 (90 Base) MCG/ACT inhaler inhale 2 puffs by mouth every 6 hours if needed for wheezing or shortness of breath 8.5 g 2  . 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 Venlafaxine HCl (EFFEXOR PO) Take by mouth.    . bumetanide (BUMEX) 2 MG tablet TAKE 1 TABLET(2 MG) BY MOUTH TWICE DAILY 180 tablet 0  . FLUoxetine (PROZAC) 40 MG capsule tke 1 capsule daily for Mood 90 capsule 1   No current facility-administered medications on file prior to visit.     Allergies  Allergen Reactions  . Codeine Other (See Comments)    Abdominal pain  . Erythromycin Base     GI upset  . Penicillins Rash    Current Problems (verified) Patient Active Problem List   Diagnosis Date Noted  . Abnormal glucose 05/21/2018  . Screening for ischemic heart disease 05/21/2018  . Attention deficit hyperactivity disorder (ADHD) 05/21/2018  . Severe recurrent major depression without psychotic features ([place]) 09/17/2017    Class: Chronic  . Extrinsic asthma 07/29/2015  . Body mass index (BMI) of 23.0-23.9 in adult 07/29/2015  . Smoker 04/14/2015  . Screening for  colorectal cancer 04/14/2015  . Gastroesophageal reflux disease 02/15/2015  . Medication management 02/12/2014  . Hypothyroidism   . Essential hypertension 08/18/2013  . Hyperlipidemia, mixed 08/18/2013  . Depression, major, recurrent, in partial remission ([place]) 08/18/2013  . Vitamin D deficiency 08/18/2013    Screening Tests Immunization History  Administered Date(s) Administered  . Influenza Split 06/22/2015  . Influenza, High Dose Seasonal PF 07/16/2018  . Influenza,inj,quad, With Preservative 07/05/2016  . Influenza-Unspecified 07/22/2017  . Pneumococcal-Unspecified 09/25/2004  . Td 09/25/2004  . Tdap 12/01/2015    Preventative care: Last colonoscopy: 2015 Last mammogram: 2011, DUE Last pap smear/pelvic exam: 2016   DEXA:DUE  Prior vaccinations: TD or Tdap: 2017  Influenza: 2019 Pneumococcal: 2006 Prevnar13: received today Shingles/Zostavax: DUE  Check with local pharmacy   Names of Other Physician/Practitioners you currently use: 1. [facility] here for primary care 2.Eye Exam, 2019 3. Dentist 2020  Patient Care Team: Unk Pinto, MD as PCP - General (Internal Medicine) Dian Queen, MD as Consulting Physician (Obstetrics and Gynecology) Milus Banister, MD as Attending Physician (Gastroenterology)  SURGICAL HISTORY She  has a past surgical history that includes Cholecystectomy (1975); Nasal sinus surgery (1996); Liposuction (1998); Breast surgery (Bilateral, 1983); Breast reconstruction (2008); Appendectomy; bil breast lumpectomy; bilateral breast masectomy (no breast cancer); and EUS (N/A, 04/23/2014). FAMILY HISTORY Her family history includes ADD / ADHD in her son; Alcohol abuse in her father and paternal uncle; Anxiety disorder in her sister; COPD in her mother; Dementia in her mother; Depression in her mother and sister; Diabetes in her sister; Early death in her father and sister; Emphysema in her mother; Lung  cancer in her mother; Schizophrenia in her sister. SOCIAL HISTORY She  reports that she has been smoking e-cigarettes. She has been smoking about 0.50 packs per day. She has never used smokeless tobacco. She reports current alcohol use of about  4.0 - 5.0 standard drinks of alcohol per week. She reports that she does not use drugs.   MEDICARE WELLNESS OBJECTIVES: Physical activity: Current Exercise Habits: Home exercise routine, Type of exercise: Other - see comments(House and yard work), Exercise limited by: psychological condition(s) Cardiac risk factors: Cardiac Risk Factors include: none Depression/mood screen:   Depression screen PHQ 2/9 12/03/2018  Decreased Interest 2  Down, Depressed, Hopeless 3  PHQ - 2 Score 5  Altered sleeping 0  Tired, decreased energy 3  Change in appetite 3  Feeling bad or failure about yourself  1  Trouble concentrating 1  Moving slowly or fidgety/restless 0  Suicidal thoughts 0  PHQ-9 Score 13  Difficult doing work/chores Not difficult at all  Some encounter information is confidential and restricted. Go to Review Flowsheets activity to see all data.  Some recent data might be hidden    ADLs:  In your present state of health, do you have any difficulty performing the following activities: 12/03/2018 05/21/2018  Hearing? N N  Vision? N N  Difficulty concentrating or making decisions? N N  Walking or climbing stairs? N N  Dressing or bathing? N N  Doing errands, shopping? N N  Preparing Food and eating ? N -  Using the Toilet? N -  In the past six months, have you accidently leaked urine? N -  Do you have problems with loss of bowel control? N -  Managing your Medications? N -  Managing your Finances? N -  Housekeeping or managing your Housekeeping? N -  Some recent data might be hidden     Cognitive Testing  Alert? Yes  Normal Appearance?Yes  Oriented to person? Yes  Place? Yes   Time? Yes  Recall of three objects?  Yes  Can perform simple  calculations? Yes  Displays appropriate judgment?Yes  Can read the correct time from a watch face?Yes  EOL planning: Does Patient Have a Medical Advance Directive?: No Would patient like information on creating a medical advance directive?: Yes (Inpatient - patient requests chaplain consult to create a medical advance directive)  Review of Systems  Constitutional: Negative for chills, diaphoresis, fever, malaise/fatigue and weight loss.  HENT: Negative for congestion, ear discharge, ear pain, hearing loss, nosebleeds, sinus pain, sore throat and tinnitus.   Eyes: Negative for blurred vision, double vision, photophobia, pain, discharge and redness.  Respiratory: Negative for cough, hemoptysis, sputum production, shortness of breath, wheezing and stridor.   Cardiovascular: Negative for chest pain, palpitations, orthopnea, claudication, leg swelling and PND.  Gastrointestinal: Negative for abdominal pain, blood in stool, constipation, diarrhea, heartburn, melena, nausea and vomiting.  Genitourinary: Negative for dysuria, flank pain, frequency, hematuria and urgency.  Musculoskeletal: Negative for back pain, falls, joint pain, myalgias and neck pain.  Skin: Negative for itching and rash.  Neurological: Negative for dizziness, tingling, tremors, sensory change, speech change, focal weakness, seizures, loss of consciousness, weakness and headaches.  Endo/Heme/Allergies: Negative for environmental allergies and polydipsia. Does not bruise/bleed easily.  Psychiatric/Behavioral: Negative for depression, hallucinations, memory loss, substance abuse and suicidal ideas. The patient is not nervous/anxious and does not have insomnia.      Objective:     Today's Vitals   12/03/18 1459  BP: 112/66  Pulse: 89  Temp: (!) 97.3 F (36.3 C)  SpO2: 95%  Weight: 144 lb (65.3 kg)  Height: 5' 3.25" (1.607 m)   Body mass index is 25.31 kg/m.  General appearance: alert, no distress, WD/WN, female HEENT:  normocephalic, sclerae  anicteric, TMs pearly, nares patent, no discharge or erythema, pharynx normal Oral cavity: MMM, no lesions Neck: supple, no lymphadenopathy, no thyromegaly, no masses Heart: RRR, normal S1, S2, no murmurs Lungs: CTA bilaterally, no wheezes, rhonchi, or rales Abdomen: +bs, soft, non tender, non distended, no masses, no hepatomegaly, no splenomegaly Musculoskeletal: nontender, no swelling, no obvious deformity Extremities: no edema, no cyanosis, no clubbing Pulses: 2+ symmetric, upper and lower extremities, normal cap refill Neurological: alert, oriented x 3, CN2-12 intact, strength normal upper extremities and lower extremities, sensation normal throughout, DTRs 2+ throughout, no cerebellar signs, gait normal Psychiatric: normal affect, behavior normal, pleasant   Medicare Attestation I have personally reviewed: The patient's medical and social history Their use of alcohol, tobacco or illicit drugs Their current medications and supplements The patient's functional ability including ADLs,fall risks, home safety risks, cognitive, and hearing and visual impairment Diet and physical activities Evidence for depression or mood disorders  The patient's weight, height, BMI, and visual acuity have been recorded in the chart.  I have made referrals, counseling, and provided education to the patient based on review of the above and I have provided the patient with a written personalized care plan for preventive services.     Garnet Sierras, NP   12/03/2018

## 2018-12-03 ENCOUNTER — Encounter: Payer: Self-pay | Admitting: Adult Health Nurse Practitioner

## 2018-12-03 ENCOUNTER — Ambulatory Visit: Payer: Self-pay | Admitting: Internal Medicine

## 2018-12-03 ENCOUNTER — Ambulatory Visit (INDEPENDENT_AMBULATORY_CARE_PROVIDER_SITE_OTHER): Payer: Medicare Other | Admitting: Adult Health Nurse Practitioner

## 2018-12-03 VITALS — BP 112/66 | HR 89 | Temp 97.3°F | Ht 63.25 in | Wt 144.0 lb

## 2018-12-03 DIAGNOSIS — Z79899 Other long term (current) drug therapy: Secondary | ICD-10-CM

## 2018-12-03 DIAGNOSIS — Z23 Encounter for immunization: Secondary | ICD-10-CM | POA: Diagnosis not present

## 2018-12-03 DIAGNOSIS — Z0000 Encounter for general adult medical examination without abnormal findings: Secondary | ICD-10-CM

## 2018-12-03 DIAGNOSIS — F3341 Major depressive disorder, recurrent, in partial remission: Secondary | ICD-10-CM

## 2018-12-03 DIAGNOSIS — R8290 Unspecified abnormal findings in urine: Secondary | ICD-10-CM

## 2018-12-03 DIAGNOSIS — I10 Essential (primary) hypertension: Secondary | ICD-10-CM

## 2018-12-03 DIAGNOSIS — F332 Major depressive disorder, recurrent severe without psychotic features: Secondary | ICD-10-CM

## 2018-12-03 DIAGNOSIS — Z6823 Body mass index (BMI) 23.0-23.9, adult: Secondary | ICD-10-CM | POA: Diagnosis not present

## 2018-12-03 DIAGNOSIS — B009 Herpesviral infection, unspecified: Secondary | ICD-10-CM | POA: Diagnosis not present

## 2018-12-03 DIAGNOSIS — R7309 Other abnormal glucose: Secondary | ICD-10-CM

## 2018-12-03 DIAGNOSIS — E782 Mixed hyperlipidemia: Secondary | ICD-10-CM

## 2018-12-03 DIAGNOSIS — F17200 Nicotine dependence, unspecified, uncomplicated: Secondary | ICD-10-CM | POA: Diagnosis not present

## 2018-12-03 DIAGNOSIS — Z0001 Encounter for general adult medical examination with abnormal findings: Secondary | ICD-10-CM

## 2018-12-03 DIAGNOSIS — J45909 Unspecified asthma, uncomplicated: Secondary | ICD-10-CM

## 2018-12-03 DIAGNOSIS — E559 Vitamin D deficiency, unspecified: Secondary | ICD-10-CM | POA: Diagnosis not present

## 2018-12-03 DIAGNOSIS — F909 Attention-deficit hyperactivity disorder, unspecified type: Secondary | ICD-10-CM | POA: Diagnosis not present

## 2018-12-03 DIAGNOSIS — K219 Gastro-esophageal reflux disease without esophagitis: Secondary | ICD-10-CM

## 2018-12-03 DIAGNOSIS — E079 Disorder of thyroid, unspecified: Secondary | ICD-10-CM

## 2018-12-03 DIAGNOSIS — R6889 Other general symptoms and signs: Secondary | ICD-10-CM | POA: Diagnosis not present

## 2018-12-03 MED ORDER — VALACYCLOVIR HCL 500 MG PO TABS: ORAL_TABLET | ORAL | 3 refills | Status: DC

## 2018-12-03 NOTE — Patient Instructions (Signed)
We will contact you in 1-3 day by phone with your lab results  We printed a prescription for you for Valtrex to take to your pharmacy.  Use this at onset of symptoms   Follow up in 3 months    We Do NOT Approve of  [facility], [employer]  Our Patients  To Do Home Visits & We Do NOT Approve of [employer] > > > > > > > > > > > > > > > > > > > > > > > > > > > > > > > > > > > > > > >  Preventive Care for Adults  A healthy lifestyle and preventive care can promote health and wellness. Preventive health guidelines for women include the following key practices.  A routine yearly physical is a good way to check with your health care provider about your health and preventive screening. It is a chance to share any concerns and updates on your health and to receive a thorough exam.  Visit your dentist for a routine exam and preventive care every 6 months. Brush your teeth twice a day and floss once a day. Good oral hygiene prevents tooth decay and gum disease.  The frequency of eye exams is based on your age, health, family medical history, use of contact lenses, and other factors. Follow your health care provider's recommendations for frequency of eye exams.  Eat a healthy diet. Foods like vegetables, fruits, whole grains, low-fat dairy products, and lean protein foods contain the nutrients you need without too many calories. Decrease your intake of foods high in solid fats, added sugars, and salt. Eat the right amount of calories for you. Get information about a proper diet from your health care provider, if necessary.  Regular physical exercise is one of the most important things you can do for your health. Most adults should get at least 150 minutes of moderate-intensity exercise (any activity that increases your heart rate and causes you to sweat) each week. In addition, most adults need muscle-strengthening exercises on 2 or more days a week.  Maintain a healthy  weight. The body mass index (BMI) is a screening tool to identify possible weight problems. It provides an estimate of body fat based on height and weight. Your health care provider can find your BMI and can help you achieve or maintain a healthy weight. For adults 20 years and older:  A BMI below 18.5 is considered underweight.  A BMI of 18.5 to 24.9 is normal.  A BMI of 25 to 29.9 is considered overweight.  A BMI of 30 and above is considered obese.  Maintain normal blood lipids and cholesterol levels by exercising and minimizing your intake of saturated fat. Eat a balanced diet with plenty of fruit and vegetables. If your lipid or cholesterol levels are high, you are over 50, or you are at high risk for heart disease, you may need your cholesterol levels checked more frequently. Ongoing high lipid and cholesterol levels should be treated with medicines if diet and exercise are not working.  If you smoke, find out from your health care provider how to quit. If you do not use tobacco, do not start.  Lung cancer screening is recommended for adults aged 21-80 years who are at high risk for developing lung cancer because of a history of smoking. A yearly low-dose CT scan of the lungs is recommended for people who have at least a 30-pack-year history of smoking and are  a current smoker or have quit within the past 15 years. A pack year of smoking is smoking an average of 1 pack of cigarettes a day for 1 year (for example: 1 pack a day for 30 years or 2 packs a day for 15 years). Yearly screening should continue until the smoker has stopped smoking for at least 15 years. Yearly screening should be stopped for people who develop a health problem that would prevent them from having lung cancer treatment.  Avoid use of street drugs. Do not share needles with anyone. Ask for help if you need support or instructions about stopping the use of drugs.  High blood pressure causes heart disease and increases the  risk of stroke.  Ongoing high blood pressure should be treated with medicines if weight loss and exercise do not work.  If you are 29-7 years old, ask your health care provider if you should take aspirin to prevent strokes.  Diabetes screening involves taking a blood sample to check your fasting blood sugar level. This should be done once every 3 years, after age 5, if you are within normal weight and without risk factors for diabetes. Testing should be considered at a younger age or be carried out more frequently if you are overweight and have at least 1 risk factor for diabetes.  Breast cancer screening is essential preventive care for women. You should practice "breast self-awareness." This means understanding the normal appearance and feel of your breasts and may include breast self-examination. Any changes detected, no matter how small, should be reported to a health care provider. Women in their 78s and 30s should have a clinical breast exam (CBE) by a health care provider as part of a regular health exam every 1 to 3 years. After age 40, women should have a CBE every year. Starting at age 41, women should consider having a mammogram (breast X-ray test) every year. Women who have a family history of breast cancer should talk to their health care provider about genetic screening. Women at a high risk of breast cancer should talk to their health care providers about having an MRI and a mammogram every year.  Breast cancer gene (BRCA)-related cancer risk assessment is recommended for women who have family members with BRCA-related cancers. BRCA-related cancers include breast, ovarian, tubal, and peritoneal cancers. Having family members with these cancers may be associated with an increased risk for harmful changes (mutations) in the breast cancer genes BRCA1 and BRCA2. Results of the assessment will determine the need for genetic counseling and BRCA1 and BRCA2 testing.  Routine pelvic exams to screen  for cancer are no longer recommended for nonpregnant women who are considered low risk for cancer of the pelvic organs (ovaries, uterus, and vagina) and who do not have symptoms. Ask your health care provider if a screening pelvic exam is right for you.  If you have had past treatment for cervical cancer or a condition that could lead to cancer, you need Pap tests and screening for cancer for at least 20 years after your treatment. If Pap tests have been discontinued, your risk factors (such as having a new sexual partner) need to be reassessed to determine if screening should be resumed. Some women have medical problems that increase the chance of getting cervical cancer. In these cases, your health care provider may recommend more frequent screening and Pap tests.    Colorectal cancer can be detected and often prevented. Most routine colorectal cancer screening begins at the age  of 50 years and continues through age 80 years. However, your health care provider may recommend screening at an earlier age if you have risk factors for colon cancer. On a yearly basis, your health care provider may provide home test kits to check for hidden blood in the stool. Use of a small camera at the end of a tube, to directly examine the colon (sigmoidoscopy or colonoscopy), can detect the earliest forms of colorectal cancer. Talk to your health care provider about this at age 68, when routine screening begins.  Direct exam of the colon should be repeated every 5-10 years through age 24 years, unless early forms of pre-cancerous polyps or small growths are found.  Osteoporosis is a disease in which the bones lose minerals and strength with aging. This can result in serious bone fractures or breaks. The risk of osteoporosis can be identified using a bone density scan. Women ages 30 years and over and women at risk for fractures or osteoporosis should discuss screening with their health care providers. Ask your health care  provider whether you should take a calcium supplement or vitamin D to reduce the rate of osteoporosis.  Menopause can be associated with physical symptoms and risks. Hormone replacement therapy is available to decrease symptoms and risks. You should talk to your health care provider about whether hormone replacement therapy is right for you.  Use sunscreen. Apply sunscreen liberally and repeatedly throughout the day. You should seek shade when your shadow is shorter than you. Protect yourself by wearing long sleeves, pants, a wide-brimmed hat, and sunglasses year round, whenever you are outdoors.  Once a month, do a whole body skin exam, using a mirror to look at the skin on your back. Tell your health care provider of new moles, moles that have irregular borders, moles that are larger than a pencil eraser, or moles that have changed in shape or color.  Stay current with required vaccines (immunizations).  Influenza vaccine. All adults should be immunized every year.  Tetanus, diphtheria, and acellular pertussis (Td, Tdap) vaccine. Pregnant women should receive 1 dose of Tdap vaccine during each pregnancy. The dose should be obtained regardless of the length of time since the last dose. Immunization is preferred during the 27th-36th week of gestation. An adult who has not previously received Tdap or who does not know her vaccine status should receive 1 dose of Tdap. This initial dose should be followed by tetanus and diphtheria toxoids (Td) booster doses every 10 years. Adults with an unknown or incomplete history of completing a 3-dose immunization series with Td-containing vaccines should begin or complete a primary immunization series including a Tdap dose. Adults should receive a Td booster every 10 years.    Zoster vaccine. One dose is recommended for adults aged 20 years or older unless certain conditions are present.    Pneumococcal 13-valent conjugate (PCV13) vaccine. When indicated, a  person who is uncertain of her immunization history and has no record of immunization should receive the PCV13 vaccine. An adult aged 86 years or older who has certain medical conditions and has not been previously immunized should receive 1 dose of PCV13 vaccine. This PCV13 should be followed with a dose of pneumococcal polysaccharide (PPSV23) vaccine. The PPSV23 vaccine dose should be obtained at least 1 or more year(s) after the dose of PCV13 vaccine. An adult aged 43 years or older who has certain medical conditions and previously received 1 or more doses of PPSV23 vaccine should receive 1 dose  of PCV13. The PCV13 vaccine dose should be obtained 1 or more years after the last PPSV23 vaccine dose.    Pneumococcal polysaccharide (PPSV23) vaccine. When PCV13 is also indicated, PCV13 should be obtained first. All adults aged 38 years and older should be immunized. An adult younger than age 63 years who has certain medical conditions should be immunized. Any person who resides in a nursing home or long-term care facility should be immunized. An adult smoker should be immunized. People with an immunocompromised condition and certain other conditions should receive both PCV13 and PPSV23 vaccines. People with human immunodeficiency virus (HIV) infection should be immunized as soon as possible after diagnosis. Immunization during chemotherapy or radiation therapy should be avoided. Routine use of PPSV23 vaccine is not recommended for American Indians, [place] Natives, or people younger than 65 years unless there are medical conditions that require PPSV23 vaccine. When indicated, people who have unknown immunization and have no record of immunization should receive PPSV23 vaccine. One-time revaccination 5 years after the first dose of PPSV23 is recommended for people aged 19-64 years who have chronic kidney failure, nephrotic syndrome, asplenia, or immunocompromised conditions. People who received 1-2 doses of PPSV23  before age 35 years should receive another dose of PPSV23 vaccine at age 13 years or later if at least 5 years have passed since the previous dose. Doses of PPSV23 are not needed for people immunized with PPSV23 at or after age 30 years.   Preventive Services / Frequency  Ages 32 years and over  Blood pressure check.  Lipid and cholesterol check.  Lung cancer screening. / Every year if you are aged 55-80 years and have a 30-pack-year history of smoking and currently smoke or have quit within the past 15 years. Yearly screening is stopped once you have quit smoking for at least 15 years or develop a health problem that would prevent you from having lung cancer treatment.  Clinical breast exam.** / Every year after age 31 years.   BRCA-related cancer risk assessment.** / For women who have family members with a BRCA-related cancer (breast, ovarian, tubal, or peritoneal cancers).  Mammogram.** / Every year beginning at age 73 years and continuing for as long as you are in good health. Consult with your health care provider.  Pap test.** / Every 3 years starting at age 76 years through age 56 or 86 years with 3 consecutive normal Pap tests. Testing can be stopped between 65 and 70 years with 3 consecutive normal Pap tests and no abnormal Pap or HPV tests in the past 10 years.  Fecal occult blood test (FOBT) of stool. / Every year beginning at age 54 years and continuing until age 49 years. You may not need to do this test if you get a colonoscopy every 10 years.  Flexible sigmoidoscopy or colonoscopy.** / Every 5 years for a flexible sigmoidoscopy or every 10 years for a colonoscopy beginning at age 11 years and continuing until age 24 years.  Hepatitis C blood test.** / For all people born from [birth year] through 1963 and any individual with known risks for hepatitis C.  Osteoporosis screening.** / A one-time screening for women ages 8 years and over and women at risk for fractures or  osteoporosis.  Skin self-exam. / Monthly.  Influenza vaccine. / Every year.  Tetanus, diphtheria, and acellular pertussis (Tdap/Td) vaccine.** / 1 dose of Td every 10 years.  Zoster vaccine.** / 1 dose for adults aged 17 years or older.  Pneumococcal 13-valent conjugate (  PCV13) vaccine.** / Consult your health care provider.  Pneumococcal polysaccharide (PPSV23) vaccine.** / 1 dose for all adults aged 35 years and older. Screening for abdominal aortic aneurysm (AAA)  by ultrasound is recommended for people who have history of high blood pressure or who are current or former smokers. ++++++++++++++++++++ Recommend Adult Low Dose Aspirin or  coated  Aspirin 81 mg daily  To reduce risk of Colon Cancer 20 %,  Skin Cancer 26 % ,  Melanoma 46%  and  Pancreatic cancer 60% ++++++++++++++++++++ Vitamin D goal  is between 70-100.  Please make sure that you are taking your Vitamin D as directed.  It is very important as a natural anti-inflammatory  helping hair, skin, and nails, as well as reducing stroke and heart attack risk.  It helps your bones and helps with mood. It also decreases numerous cancer risks so please take it as directed.  Low Vit D is associated with a 200-300% higher risk for CANCER  and 200-300% higher risk for HEART   ATTACK  &  STROKE.   .....................................Marland Kitchen It is also associated with higher death rate at younger ages,  autoimmune diseases like Rheumatoid arthritis, Lupus, Multiple Sclerosis.    Also many other serious conditions, like depression, Alzheimer's Dementia, infertility, muscle aches, fatigue, fibromyalgia - just to name a few. ++++++++++++++++++ Recommend the book "The END of DIETING" by Dr Excell Seltzer  & the book "The END of DIABETES " by Dr Excell Seltzer At [email or web address] - get book & Audio CD's    Being diabetic has a  300% increased risk for heart attack, stroke, cancer, and alzheimer- type vascular dementia. It is very important that  you work harder with diet by avoiding all foods that are white. Avoid white rice (brown & wild rice is OK), white potatoes (sweetpotatoes in moderation is OK), White bread or wheat bread or anything made out of white flour like bagels, donuts, rolls, buns, biscuits, cakes, pastries, cookies, pizza crust, and pasta (made from white flour & egg whites) - vegetarian pasta or spinach or wheat pasta is OK. Multigrain breads like Arnold's or Pepperidge [employer], or multigrain sandwich thins or flatbreads.  Diet, exercise and weight loss can reverse and cure diabetes in the early stages.  Diet, exercise and weight loss is very important in the control and prevention of complications of diabetes which affects every system in your body, ie. Brain - dementia/stroke, eyes - glaucoma/blindness, heart - heart attack/heart failure, kidneys - dialysis, stomach - gastric paralysis, intestines - malabsorption, nerves - severe painful neuritis, circulation - gangrene & loss of a leg(s), and finally cancer and Alzheimers.    I recommend avoid fried & greasy foods,  sweets/candy, white rice (brown or wild rice or Quinoa is OK), white potatoes (sweet potatoes are OK) - anything made from white flour - bagels, doughnuts, rolls, buns, biscuits,white and wheat breads, pizza crust and traditional pasta made of white flour & egg white(vegetarian pasta or spinach or wheat pasta is OK).  Multi-grain bread is OK - like multi-grain flat bread or sandwich thins. Avoid alcohol in excess. Exercise is also important.    Eat all the vegetables you want - avoid meat, especially red meat and dairy - especially cheese.  Cheese is the most concentrated form of trans-fats which is the worst thing to clog up our arteries. Veggie cheese is OK which can be found in the fresh produce section at [employer] or [employer] or [employer]  +++++++++++++++++++ DASH Eating Plan  DASH stands for "Dietary Approaches to Stop Hypertension."   The DASH eating  plan is a healthy eating plan that has been shown to reduce high blood pressure (hypertension). Additional health benefits may include reducing the risk of type 2 diabetes mellitus, heart disease, and stroke. The DASH eating plan may also help with weight loss. WHAT DO I NEED TO KNOW ABOUT THE DASH EATING PLAN? For the DASH eating plan, you will follow these general guidelines:  Choose foods with a percent daily value for sodium of less than 5% (as listed on the food label).  Use salt-free seasonings or herbs instead of table salt or sea salt.  Check with your health care provider or pharmacist before using salt substitutes.  Eat lower-sodium products, often labeled as "lower sodium" or "no salt added."  Eat fresh foods.  Eat more vegetables, fruits, and low-fat dairy products.  Choose whole grains. Look for the word "whole" as the first word in the ingredient list.  Choose fish   Limit sweets, desserts, sugars, and sugary drinks.  Choose heart-healthy fats.  Eat veggie cheese   Eat more home-cooked food and less restaurant, buffet, and fast food.  Limit fried foods.  Cook foods using methods other than frying.  Limit canned vegetables. If you do use them, rinse them well to decrease the sodium.  When eating at a restaurant, ask that your food be prepared with less salt, or no salt if possible.                      WHAT FOODS CAN I EAT? Read Dr Fara Olden Fuhrman's books on The End of Dieting & The End of Diabetes  Grains Whole grain or whole wheat bread. Brown rice. Whole grain or whole wheat pasta. Quinoa, bulgur, and whole grain cereals. Low-sodium cereals. Corn or whole wheat flour tortillas. Whole grain cornbread. Whole grain crackers. Low-sodium crackers.  Vegetables Fresh or frozen vegetables (raw, steamed, roasted, or grilled). Low-sodium or reduced-sodium tomato and vegetable juices. Low-sodium or reduced-sodium tomato sauce and paste. Low-sodium or reduced-sodium canned  vegetables.   Fruits All fresh, canned (in natural juice), or frozen fruits.  Protein Products  All fish and seafood.  Dried beans, peas, or lentils. Unsalted nuts and seeds. Unsalted canned beans.  Dairy Low-fat dairy products, such as skim or 1% milk, 2% or reduced-fat cheeses, low-fat ricotta or cottage cheese, or plain low-fat yogurt. Low-sodium or reduced-sodium cheeses.  Fats and Oils Tub margarines without trans fats. Light or reduced-fat mayonnaise and salad dressings (reduced sodium). Avocado. Safflower, olive, or canola oils. Natural peanut or almond butter.  Other Unsalted popcorn and pretzels. The items listed above may not be a complete list of recommended foods or beverages. Contact your dietitian for more options.  +++++++++++++++  WHAT FOODS ARE NOT RECOMMENDED? Grains/ White flour or wheat flour White bread. White pasta. White rice. Refined cornbread. Bagels and croissants. Crackers that contain trans fat.  Vegetables  Creamed or fried vegetables. Vegetables in a . Regular canned vegetables. Regular canned tomato sauce and paste. Regular tomato and vegetable juices.  Fruits Dried fruits. Canned fruit in light or heavy syrup. Fruit juice.  Meat and Other Protein Products Meat in general - RED meat & White meat.  Fatty cuts of meat. Ribs, chicken wings, all processed meats as bacon, sausage, bologna, salami, fatback, hot dogs, bratwurst and packaged luncheon meats.  Dairy Whole or 2% milk, cream, half-and-half, and cream cheese. Whole-fat or sweetened yogurt. Full-fat cheeses  or blue cheese. Non-dairy creamers and whipped toppings. Processed cheese, cheese spreads, or cheese curds.  Condiments Onion and garlic salt, seasoned salt, table salt, and sea salt. Canned and packaged gravies. Worcestershire sauce. Tartar sauce. Barbecue sauce. Teriyaki sauce. Soy sauce, including reduced sodium. Steak sauce. Fish sauce. Oyster sauce. Cocktail sauce. Horseradish. Ketchup  and mustard. Meat flavorings and tenderizers. Bouillon cubes. Hot sauce. Tabasco sauce. Marinades. Taco seasonings. Relishes.  Fats and Oils Butter, stick margarine, lard, shortening and bacon fat. Coconut, palm kernel, or palm oils. Regular salad dressings.  Pickles and olives. Salted popcorn and pretzels.  The items listed above may not be a complete list of foods and beverages to avoid.

## 2018-12-04 LAB — CBC WITH DIFFERENTIAL/PLATELET
Absolute Monocytes: 422 cells/uL (ref 200–950)
Basophils Absolute: 82 cells/uL (ref 0–200)
Basophils Relative: 1.2 %
Eosinophils Absolute: 211 cells/uL (ref 15–500)
Eosinophils Relative: 3.1 %
HCT: 39 % (ref 35.0–45.0)
Hemoglobin: 13.1 g/dL (ref 11.7–15.5)
Lymphs Abs: 3611 cells/uL (ref 850–3900)
MCH: 30.6 pg (ref 27.0–33.0)
MCHC: 33.6 g/dL (ref 32.0–36.0)
MCV: 91.1 fL (ref 80.0–100.0)
MPV: 11 fL (ref 7.5–12.5)
Monocytes Relative: 6.2 %
Neutro Abs: 2475 cells/uL (ref 1500–7800)
Neutrophils Relative %: 36.4 %
Platelets: 355 10*3/uL (ref 140–400)
RBC: 4.28 10*6/uL (ref 3.80–5.10)
RDW: 12.6 % (ref 11.0–15.0)
Total Lymphocyte: 53.1 %
WBC: 6.8 10*3/uL (ref 3.8–10.8)

## 2018-12-04 LAB — LIPID PANEL
Cholesterol: 239 mg/dL — ABNORMAL HIGH (ref <200)
HDL: 60 mg/dL (ref >=50)
LDL Cholesterol (Calc): 145 mg/dL (calc) — ABNORMAL HIGH
Non-HDL Cholesterol (Calc): 179 mg/dL (calc) — ABNORMAL HIGH (ref <130)
Total CHOL/HDL Ratio: 4 (calc) (ref <5.0)
Triglycerides: 203 mg/dL — ABNORMAL HIGH (ref <150)

## 2018-12-04 LAB — HEMOGLOBIN A1C
Hgb A1c MFr Bld: 5.6 % of total Hgb (ref <5.7)
Mean Plasma Glucose: 114 (calc)
eAG (mmol/L): 6.3 (calc)

## 2018-12-04 LAB — COMPLETE METABOLIC PANEL WITH GFR
AG Ratio: 1.6 (calc) (ref 1.0–2.5)
ALT: 20 U/L (ref 6–29)
AST: 18 U/L (ref 10–35)
Albumin: 4.4 g/dL (ref 3.6–5.1)
Alkaline phosphatase (APISO): 82 U/L (ref 37–153)
BUN: 14 mg/dL (ref 7–25)
CO2: 33 mmol/L — ABNORMAL HIGH (ref 20–32)
Calcium: 9.6 mg/dL (ref 8.6–10.4)
Chloride: 96 mmol/L — ABNORMAL LOW (ref 98–110)
Creat: 0.74 mg/dL (ref 0.50–0.99)
GFR, Est African American: 99 mL/min/{1.73_m2} (ref >=60)
GFR, Est Non African American: 85 mL/min/{1.73_m2} (ref >=60)
Globulin: 2.8 g/dL (calc) (ref 1.9–3.7)
Glucose, Bld: 95 mg/dL (ref 65–99)
Potassium: 3.9 mmol/L (ref 3.5–5.3)
Sodium: 139 mmol/L (ref 135–146)
Total Bilirubin: 0.4 mg/dL (ref 0.2–1.2)
Total Protein: 7.2 g/dL (ref 6.1–8.1)

## 2018-12-04 LAB — URINALYSIS W MICROSCOPIC + REFLEX CULTURE
Bacteria, UA: NONE SEEN /HPF
Bilirubin Urine: NEGATIVE
Glucose, UA: NEGATIVE
Hgb urine dipstick: NEGATIVE
Hyaline Cast: NONE SEEN /LPF
Ketones, ur: NEGATIVE
Leukocyte Esterase: NEGATIVE
Nitrites, Initial: NEGATIVE
Protein, ur: NEGATIVE
RBC / HPF: NONE SEEN /HPF (ref 0–2)
Specific Gravity, Urine: 1.019 (ref 1.001–1.03)
Squamous Epithelial / HPF: NONE SEEN /HPF (ref <=5)
WBC, UA: NONE SEEN /HPF (ref 0–5)
pH: 5.5 (ref 5.0–8.0)

## 2018-12-04 LAB — NO CULTURE INDICATED

## 2018-12-04 LAB — MAGNESIUM: Magnesium: 1.9 mg/dL (ref 1.5–2.5)

## 2018-12-04 LAB — INSULIN, RANDOM: Insulin: 2 u[IU]/mL

## 2018-12-04 LAB — TSH: TSH: 4.07 mIU/L (ref 0.40–4.50)

## 2018-12-04 LAB — VITAMIN D 25 HYDROXY (VIT D DEFICIENCY, FRACTURES): Vit D, 25-Hydroxy: 43 ng/mL (ref 30–100)

## 2018-12-09 DIAGNOSIS — M25532 Pain in left wrist: Secondary | ICD-10-CM | POA: Diagnosis not present

## 2018-12-09 DIAGNOSIS — M79632 Pain in left forearm: Secondary | ICD-10-CM | POA: Diagnosis not present

## 2018-12-17 ENCOUNTER — Other Ambulatory Visit: Payer: Self-pay | Admitting: Internal Medicine

## 2018-12-17 DIAGNOSIS — F909 Attention-deficit hyperactivity disorder, unspecified type: Secondary | ICD-10-CM

## 2018-12-17 MED ORDER — METHYLPHENIDATE HCL 20 MG PO TABS: ORAL_TABLET | ORAL | 0 refills | Status: DC

## 2018-12-27 ENCOUNTER — Other Ambulatory Visit: Payer: Self-pay | Admitting: Internal Medicine

## 2018-12-27 ENCOUNTER — Other Ambulatory Visit: Payer: Self-pay | Admitting: *Deleted

## 2018-12-27 MED ORDER — ATORVASTATIN CALCIUM 80 MG PO TABS: ORAL_TABLET | ORAL | 0 refills | Status: DC

## 2019-01-27 ENCOUNTER — Ambulatory Visit: Payer: Medicare Other | Admitting: Adult Health

## 2019-01-27 ENCOUNTER — Other Ambulatory Visit: Payer: Self-pay

## 2019-01-27 ENCOUNTER — Encounter: Payer: Self-pay | Admitting: Adult Health

## 2019-01-27 DIAGNOSIS — J4521 Mild intermittent asthma with (acute) exacerbation: Secondary | ICD-10-CM

## 2019-01-27 MED ORDER — DOXYCYCLINE HYCLATE 100 MG PO CAPS: ORAL_CAPSULE | ORAL | 0 refills | Status: DC

## 2019-01-27 MED ORDER — FLUTICASONE FUROATE-VILANTEROL 200-25 MCG/INH IN AEPB: 1.0000 | INHALATION_SPRAY | Freq: Every day | RESPIRATORY_TRACT | 11 refills | Status: DC

## 2019-01-27 MED ORDER — ALBUTEROL SULFATE HFA 108 (90 BASE) MCG/ACT IN AERS: INHALATION_SPRAY | RESPIRATORY_TRACT | 2 refills | Status: DC

## 2019-01-27 MED ORDER — PROMETHAZINE-DM 6.25-15 MG/5ML PO SYRP: 5.0000 mL | ORAL_SOLUTION | Freq: Four times a day (QID) | ORAL | 1 refills | Status: DC | PRN

## 2019-01-27 MED ORDER — PREDNISONE 20 MG PO TABS: ORAL_TABLET | ORAL | 0 refills | Status: DC

## 2019-01-27 NOTE — Progress Notes (Signed)
Virtual Visit via Telephone Note  I connected with Margaret Nelson on 01/27/19 at  3:00 PM EDT by telephone and verified that I am speaking with the correct person using two identifiers.  Location: Patient: home  Provider: [place] office   I discussed the limitations, risks, security and privacy concerns of performing an evaluation and management service by telephone and the availability of in person appointments. I also discussed with the patient that there may be a patient responsible charge related to this service. The patient expressed understanding and agreed to proceed.   History of Present Illness:  There were no vitals taken for this visit.   68 y.o. female with hx of htn controlled off of medications, GERD (recently controlled off of medications), extrinsic asthma, typically controlled without daily inhalers calls to reports new cough x 2 days. She is unable to provide VS today.   She reports new onset cough intermittently productive x 2 days, mildly wheezy/short of breath with coughing fits, "sounds croupy." She reports all symptoms improve in the evening and are worst when she gets up and starts moving around in the morning. She reports this feels similar to historical episodes of bronchitis. Denies chest pain/achiness. Denies LE edema, recent weight gain, PND, orthopnea.   She denies typically having allergies requiring antihistamine, She endorses post nasal drainage but denies nasal congestion, sneezing, watery/itchy eyes.    She reports sore throat yesterday but has since resolved; denies headache, blurry vision, nasal congestion, sinus tenderness, fever/chills, sneezing, watery/itchy eyes, n/v/d.   She denies recent travel, known sick contacts, has been wearing N95 mask when going out.   She has BREO bur does use frequently, current inhaler expired in 01/2018. She is out of albuterol.   Hasn't tried any OTC medications for her symptoms.   Past Medical History:  Diagnosis  Date  . Allergy   . Anxiety   . Arthritis   . Asthma    as a child, out grew   . Blood transfusion without reported diagnosis   . Cancer ([place])    basal cell left temple, left side of nose  . Depression   . GERD (gastroesophageal reflux disease)   . Hyperlipidemia   . Hypertension   . Thyroid disease    hypo  . Tubular adenoma of colon    All medications reviewed.   Allergies  Allergen Reactions  . Codeine Other (See Comments)    Abdominal pain  . Erythromycin Base     GI upset  . Penicillins Rash      Observations/Objective:  General : Well sounding patient in no apparent distress HEENT: no hoarseness, single brief coughing episode <10 sec for duration of visit Lungs: speaks in complete sentences, no audible wheezing, no apparent distress Neurological: alert, oriented x 3 Psychiatric: pleasant, judgement appropriate    Assessment and Plan:  Ani was seen today for cough and wheezing.  Diagnoses and all orders for this visit:  Mild intermittent asthmatic bronchitis with acute exacerbation Early in course; no highly concerning for CAP or covid 19 thus far Will restart asthma medications, patient impression is bronchitis which she has recurrent hx of  Restart BREO daily, albuterol PRN, steroid taper, cough medication Monitor for a few days; hold abx; start if symptoms persistent or with fever or increasingly productive cough Discussed the importance of avoiding unnecessary antibiotic therapy. Suggested symptomatic OTC remedies. Follow up as needed. Present to ED for sudden, persistent or severe dyspnea/chest pain or other severe symptoms -  fluticasone furoate-vilanterol (BREO ELLIPTA) 200-25 MCG/INH AEPB; Inhale 1 puff into the lungs daily. -     albuterol (PROAIR HFA) 108 (90 Base) MCG/ACT inhaler; inhale 2 puffs by mouth every 6 hours if needed for wheezing or shortness of breath -     promethazine-dextromethorphan (PROMETHAZINE-DM) 6.25-15 MG/5ML syrup;  Take 5 mLs by mouth 4 (four) times daily as needed for cough.  -     predniSONE (DELTASONE) 20 MG tablet; 2 tablets daily for 3 days, 1 tablet daily for 4 days. -     doxycycline (VIBRAMYCIN) 100 MG capsule; Take 1 capsule twice daily with food   Follow Up Instructions:    I discussed the assessment and treatment plan with the patient. The patient was provided an opportunity to ask questions and all were answered. The patient agreed with the plan and demonstrated an understanding of the instructions.   The patient was advised to call back or seek an in-person evaluation if the symptoms worsen or if the condition fails to improve as anticipated.  I provided 20 minutes of non-face-to-face time during this encounter.   Izora Ribas, NP

## 2019-02-03 ENCOUNTER — Other Ambulatory Visit: Payer: Self-pay | Admitting: Internal Medicine

## 2019-02-03 DIAGNOSIS — F909 Attention-deficit hyperactivity disorder, unspecified type: Secondary | ICD-10-CM

## 2019-02-03 MED ORDER — METHYLPHENIDATE HCL 20 MG PO TABS: ORAL_TABLET | ORAL | 0 refills | Status: DC

## 2019-02-09 ENCOUNTER — Other Ambulatory Visit: Payer: Self-pay | Admitting: Internal Medicine

## 2019-02-27 DIAGNOSIS — F330 Major depressive disorder, recurrent, mild: Secondary | ICD-10-CM | POA: Diagnosis not present

## 2019-03-05 ENCOUNTER — Ambulatory Visit: Payer: Self-pay | Admitting: Physician Assistant

## 2019-03-05 DIAGNOSIS — M84374A Stress fracture, right foot, initial encounter for fracture: Secondary | ICD-10-CM | POA: Diagnosis not present

## 2019-03-06 DIAGNOSIS — F330 Major depressive disorder, recurrent, mild: Secondary | ICD-10-CM | POA: Diagnosis not present

## 2019-03-06 NOTE — Progress Notes (Deleted)
3 MONTH FOLLOW UP  Assessment:   Essential hypertension -cont monitoring -mildly low today - TSH   Hypothyroidism -patient not taking medication on an empty stomach - TSH  Hyperlipidemia -cont diet and exercise - Lipid panel  Vitamin D deficiency -Cont Vit D supplement -recheck Vit D at 6 month intervals   Medication management - CBC with Differential/Platelet - BASIC METABOLIC PANEL WITH GFR - Hepatic function panel   Tobacco use disorder -recommended quitting -patient not interested in quitting  COPD exacerbation (HCC) -     azithromycin (ZITHROMAX) 250 MG tablet; Take 2 tablets (500 mg) on  Day 1,  followed by 1 tablet (250 mg) once daily on Days 2 through 5. -     predniSONE (DELTASONE) 20 MG tablet; 2 tablets daily for 3 days, 1 tablet daily for 4 days. -     promethazine-dextromethorphan (PROMETHAZINE-DM) 6.25-15 MG/5ML syrup; Take 5 mLs by mouth 4 (four) times daily as needed for cough. -     ipratropium-albuterol (DUONEB) 0.5-2.5 (3) MG/3ML nebulizer solution 3 mL; Take 3 mLs by nebulization once. -     albuterol (PROVENTIL HFA;VENTOLIN HFA) 108 (90 Base) MCG/ACT inhaler; Inhale 2 puffs into the lungs every 6 (six) hours as needed for wheezing or shortness of breath.     Over 30 minutes of exam, counseling, chart review, and critical decision making was performed  Future Appointments  Date Time Provider [place]  03/10/2019  1:45 PM Vicie Mutters, PA-C [facility] None  06/24/2019  2:00 PM Unk Pinto, MD [facility] None    Subjective:   Margaret Nelson is a 68 y.o. female who presents for 3 month follow up on hypertension, prediabetes, hyperlipidemia, vitamin D def.   Her blood pressure has been controlled at home, today their BP is   She does workout. She denies chest pain, shortness of breath, dizziness.  Non productive cough x 1 week with wheezing, has been on breo x 4 days in a row, is smoker, likely COPD,     She is on cholesterol  medication and denies myalgias. Her cholesterol is at goal. The cholesterol last visit was:   Lab Results  Component Value Date   CHOL 239 (H) 12/03/2018   HDL 60 12/03/2018   LDLCALC 145 (H) 12/03/2018   TRIG 203 (H) 12/03/2018   CHOLHDL 4.0 12/03/2018   Last A1C Lab Results  Component Value Date   HGBA1C 5.6 12/03/2018   Patient is on Vitamin D supplement. Lab Results  Component Value Date   VD25OH 43 12/03/2018      BMI is There is no height or weight on file to calculate BMI., she is working on diet and exercise. Wt Readings from Last 3 Encounters:  12/03/18 144 lb (65.3 kg)  05/21/18 135 lb (61.2 kg)  12/20/17 139 lb 6.4 oz (63.2 kg)   She is on thyroid medication. Her medication was not changed last visit.   Lab Results  Component Value Date   TSH 4.07 12/03/2018  .     Medication Review Current Outpatient Medications on File Prior to Visit  Medication Sig Dispense Refill  . albuterol (PROAIR HFA) 108 (90 Base) MCG/ACT inhaler inhale 2 puffs by mouth every 6 hours if needed for wheezing or shortness of breath 8.5 g 2  . ALPRAZolam (XANAX) 1 MG tablet take 1/2 to 1 tablets by mouth three times a day to four times a day (Patient not taking: Reported on 01/27/2019) 120 tablet 1  . atorvastatin (LIPITOR)  80 MG tablet TAKE 1/2 TO 1 TABLET BY MOUTH EVERY DAY AS NEEDED FOR CHOLESTEROL 90 tablet 0  . bumetanide (BUMEX) 2 MG tablet TAKE 1 TABLET(2 MG) BY MOUTH TWICE DAILY 180 tablet 0  . buPROPion (WELLBUTRIN XL) 300 MG 24 hr tablet take 1 tablet by mouth once daily (Patient taking differently: Take 150 mg by mouth 3 (three) times daily. ) 90 tablet 1  . Cholecalciferol (VITAMIN D PO) Take 5,000 Units by mouth daily.    Marland Kitchen doxycycline (VIBRAMYCIN) 100 MG capsule Take 1 capsule twice daily with food 20 capsule 0  . Flaxseed, Linseed, (FLAX SEED OIL) 1300 MG CAPS Take 1,300 mg by mouth 3 (three) times daily.    . fluticasone furoate-vilanterol (BREO ELLIPTA) 200-25 MCG/INH AEPB  Inhale 1 puff into the lungs daily. 60 each 11  . levothyroxine (SYNTHROID) 50 MCG tablet Take 1 to 1&1/2 tablet daily & Take on an empty stomach with only water for 30 minutes & no Antacid meds, Calcium or Magnesium for 4 hours & avoid Biotin 135 tablet 0  . methylphenidate (RITALIN) 20 MG tablet Take 1/2-1 tablet 2  x /day as directed for depression.  Do not take more than 5 days per week . Should last longer than 30 days. 60 tablet 0  . Omega-3 Fatty Acids (FISH OIL) 1000 MG CAPS Take 1 capsule (1,000 mg total) by mouth 3 (three) times daily.  0  . potassium chloride SA (K-DUR,KLOR-CON) 20 MEQ tablet TAKE 1 TABLET BY MOUTH THREE TIMES DAILY 270 tablet 1  . predniSONE (DELTASONE) 20 MG tablet 2 tablets daily for 3 days, 1 tablet daily for 4 days. 10 tablet 0  . promethazine-dextromethorphan (PROMETHAZINE-DM) 6.25-15 MG/5ML syrup Take 5 mLs by mouth 4 (four) times daily as needed for cough. 240 mL 1  . ranitidine (ZANTAC) 300 MG tablet TAKE 1 TO 2 TABLETS BY MOUTH EVERY DAY FOR HEARTBURN AND REFLUX TO ALLOW WEAN AND TRANSITION FROM PANTOPRAZOLE (Patient not taking: Reported on 01/27/2019) 180 tablet 0  . valACYclovir (VALTREX) 500 MG tablet Take two tablets by mouth twice a day at onset of symptoms. (Patient taking differently: as needed. Take two tablets by mouth twice a day at onset of symptoms.) 30 tablet 3  . Venlafaxine HCl (EFFEXOR PO) Take 75 mg by mouth daily.      No current facility-administered medications on file prior to visit.     Allergies: Allergies  Allergen Reactions  . Codeine Other (See Comments)    Abdominal pain  . Erythromycin Base     GI upset  . Penicillins Rash    Current Problems (verified) has Essential hypertension; Hyperlipidemia, mixed; Depression, major, recurrent, in partial remission ([place]); Vitamin D deficiency; Hypothyroidism; Medication management; Gastroesophageal reflux disease; Smoker; Screening for colorectal cancer; Extrinsic asthma; Body mass index  (BMI) of 23.0-23.9 in adult; Severe recurrent major depression without psychotic features ([place]); Abnormal glucose; Screening for ischemic heart disease; and Attention deficit hyperactivity disorder (ADHD) on their problem list.  Surgical: She  has a past surgical history that includes Cholecystectomy (1975); Nasal sinus surgery (1996); Liposuction (1998); Breast surgery (Bilateral, 1983); Breast reconstruction (2008); Appendectomy; bil breast lumpectomy; bilateral breast masectomy (no breast cancer); and EUS (N/A, 04/23/2014). Family Her family history includes ADD / ADHD in her son; Alcohol abuse in her father and paternal uncle; Anxiety disorder in her sister; COPD in her mother; Dementia in her mother; Depression in her mother and sister; Diabetes in her sister; Early death in her father  and sister; Emphysema in her mother; Lung cancer in her mother; Schizophrenia in her sister. Social history  She reports that she has been smoking e-cigarettes. She has been smoking about 0.50 packs per day. She has never used smokeless tobacco. She reports current alcohol use of about 4.0 - 5.0 standard drinks of alcohol per week. She reports that she does not use drugs.    Objective:   There were no vitals filed for this visit. There is no height or weight on file to calculate BMI.  General appearance: alert, no distress, WD/WN,  female HEENT: normocephalic, sclerae anicteric, TMs pearly, nares patent, no discharge or erythema, pharynx normal Oral cavity: MMM, no lesions Neck: supple, no lymphadenopathy, no thyromegaly, no masses Heart: RRR, normal S1, S2, no murmurs Lungs: CTA bilaterally diffuse wheezing, no rhonchi, or rales Abdomen: +bs, soft, non tender, non distended, no masses, no hepatomegaly, no splenomegaly Musculoskeletal: nontender, no swelling, no obvious deformity Extremities: no edema, no cyanosis, no clubbing Pulses: 2+ symmetric, upper and lower extremities, normal cap  refill Neurological: alert, oriented x 3, CN2-12 intact, strength normal upper extremities and lower extremities, sensation normal throughout, DTRs 2+ throughout, no cerebellar signs, gait normal Psychiatric: normal affect, behavior normal, pleasant     Vicie Mutters, PA-C   03/06/2019

## 2019-03-10 ENCOUNTER — Ambulatory Visit: Payer: Medicare Other | Admitting: Physician Assistant

## 2019-03-11 ENCOUNTER — Other Ambulatory Visit: Payer: Self-pay | Admitting: Internal Medicine

## 2019-03-11 DIAGNOSIS — R609 Edema, unspecified: Secondary | ICD-10-CM

## 2019-03-11 DIAGNOSIS — I10 Essential (primary) hypertension: Secondary | ICD-10-CM

## 2019-03-12 ENCOUNTER — Telehealth: Payer: Self-pay | Admitting: Physician Assistant

## 2019-03-12 DIAGNOSIS — F909 Attention-deficit hyperactivity disorder, unspecified type: Secondary | ICD-10-CM

## 2019-03-12 MED ORDER — METHYLPHENIDATE HCL 20 MG PO TABS: ORAL_TABLET | ORAL | 0 refills | Status: DC

## 2019-03-12 NOTE — Telephone Encounter (Signed)
-----   Message from Elenor Quinones, [place] sent at 03/10/2019  3:21 PM EDT ----- Regarding: med refill Contact: [phone] Refill on RITALIN  Please & thank you.  [place]

## 2019-03-18 NOTE — Progress Notes (Signed)
3 MONTH FOLLOW UP  THIS ENCOUNTER IS A VIRTUAL/TELEPHONE VISIT DUE TO COVID-19 - PATIENT WAS NOT SEEN IN THE OFFICE.  PATIENT HAS CONSENTED TO VIRTUAL VISIT / TELEMEDICINE VISIT  This provider placed a call to [employer] using telephone, her appointment was changed to a virtual office visit to reduce the risk of exposure to the COVID-19 virus and to help [employer] remain healthy and safe. The virtual visit will also provide continuity of care. She verbalizes understanding.    Assessment:   Essential hypertension -cont monitoring -mildly low today   Hypothyroidism -patient not taking medication on an empty stomach  Hyperlipidemia -cont diet and exercise - increase lipitor to every day  Vitamin D deficiency -Cont Vit D supplement   Tobacco use disorder -recommended quitting -patient not interested in quitting   Over 30 minutes of exam, counseling, chart review, and critical decision making was performed  Future Appointments  Date Time Provider [place]  06/24/2019  2:00 PM Unk Pinto, MD [facility] None    Subjective:   Margaret Nelson is a 68 y.o. female who presents for 3 month follow up on hypertension, prediabetes, hyperlipidemia, vitamin D def.   Her blood pressure has been controlled at home, today their BP is   She does workout. She denies chest pain, shortness of breath, dizziness.  She tripped on concrete 1 week ago and broke her foot, saw podiatry. She has never had a DEXA.    She has cough from allergies, she smokes jewel now, recurrent bronchitis. Not on inhaler.   She is following with Dr. Krista Blue, had xanax 120 last year, fiilled 10/2017, wants refill.    She is on cholesterol medication and denies myalgias. Her cholesterol is not at goal. The cholesterol last visit was:  She is on 1/2 of lipitor every other day.  Lab Results  Component Value Date   CHOL 239 (H) 12/03/2018   HDL 60 12/03/2018   LDLCALC 145 (H) 12/03/2018    TRIG 203 (H) 12/03/2018   CHOLHDL 4.0 12/03/2018   Last A1C Lab Results  Component Value Date   HGBA1C 5.6 12/03/2018   Patient is on Vitamin D supplement. Lab Results  Component Value Date   VD25OH 43 12/03/2018      BMI is There is no height or weight on file to calculate BMI., she is working on diet and exercise. Wt Readings from Last 3 Encounters:  12/03/18 144 lb (65.3 kg)  05/21/18 135 lb (61.2 kg)  12/20/17 139 lb 6.4 oz (63.2 kg)   She is on thyroid medication. Her medication was not changed last visit, she is on 1.5 pills a day, takes with coffee in the AM.    Lab Results  Component Value Date   TSH 4.07 12/03/2018  .     Medication Review Current Outpatient Medications on File Prior to Visit  Medication Sig Dispense Refill  . albuterol (PROAIR HFA) 108 (90 Base) MCG/ACT inhaler inhale 2 puffs by mouth every 6 hours if needed for wheezing or shortness of breath 8.5 g 2  . ALPRAZolam (XANAX) 1 MG tablet take 1/2 to 1 tablets by mouth three times a day to four times a day 120 tablet 1  . atorvastatin (LIPITOR) 80 MG tablet TAKE 1/2 TO 1 TABLET BY MOUTH EVERY DAY AS NEEDED FOR CHOLESTEROL 90 tablet 0  . bumetanide (BUMEX) 2 MG tablet Take 1 tablet 2 x /day fr BP & Fluid 180 tablet 1  .  buPROPion (WELLBUTRIN XL) 300 MG 24 hr tablet take 1 tablet by mouth once daily (Patient taking differently: Take 150 mg by mouth 3 (three) times daily. ) 90 tablet 1  . Cholecalciferol (VITAMIN D PO) Take 5,000 Units by mouth daily.    . Flaxseed, Linseed, (FLAX SEED OIL) 1300 MG CAPS Take 1,300 mg by mouth 3 (three) times daily.    . fluticasone furoate-vilanterol (BREO ELLIPTA) 200-25 MCG/INH AEPB Inhale 1 puff into the lungs daily. 60 each 11  . levothyroxine (SYNTHROID) 50 MCG tablet Take 1 to 1&1/2 tablet daily & Take on an empty stomach with only water for 30 minutes & no Antacid meds, Calcium or Magnesium for 4 hours & avoid Biotin 135 tablet 0  . methylphenidate (RITALIN) 20 MG  tablet Take 1/2-1 tablet 2  x /day as directed for depression.  Do not take more than 5 days per week . Should last longer than 30 days. 60 tablet 0  . Omega-3 Fatty Acids (FISH OIL) 1000 MG CAPS Take 1 capsule (1,000 mg total) by mouth 3 (three) times daily.  0  . potassium chloride SA (K-DUR,KLOR-CON) 20 MEQ tablet TAKE 1 TABLET BY MOUTH THREE TIMES DAILY 270 tablet 1  . Venlafaxine HCl (EFFEXOR PO) Take 75 mg by mouth daily.      No current facility-administered medications on file prior to visit.     Allergies: Allergies  Allergen Reactions  . Codeine Other (See Comments)    Abdominal pain  . Erythromycin Base     GI upset  . Penicillins Rash    Current Problems (verified) has Essential hypertension; Hyperlipidemia, mixed; Depression, major, recurrent, in partial remission ([place]); Vitamin D deficiency; Hypothyroidism; Medication management; Gastroesophageal reflux disease; Smoker; Screening for colorectal cancer; Extrinsic asthma; Body mass index (BMI) of 23.0-23.9 in adult; Severe recurrent major depression without psychotic features ([place]); Abnormal glucose; Screening for ischemic heart disease; and Attention deficit hyperactivity disorder (ADHD) on their problem list.  Surgical: She  has a past surgical history that includes Cholecystectomy (1975); Nasal sinus surgery (1996); Liposuction (1998); Breast surgery (Bilateral, 1983); Breast reconstruction (2008); Appendectomy; bil breast lumpectomy; bilateral breast masectomy (no breast cancer); and EUS (N/A, 04/23/2014). Family Her family history includes ADD / ADHD in her son; Alcohol abuse in her father and paternal uncle; Anxiety disorder in her sister; COPD in her mother; Dementia in her mother; Depression in her mother and sister; Diabetes in her sister; Early death in her father and sister; Emphysema in her mother; Lung cancer in her mother; Schizophrenia in her sister. Social history  She reports that she has been smoking  e-cigarettes. She has been smoking about 0.50 packs per day. She has never used smokeless tobacco. She reports current alcohol use of about 4.0 - 5.0 standard drinks of alcohol per week. She reports that she does not use drugs.    Objective:   There were no vitals filed for this visit. There is no height or weight on file to calculate BMI.  General Appearance:Well sounding, in no apparent distress.  ENT/Mouth: No hoarseness, No cough for duration of visit.  Respiratory: completing full sentences without distress, without audible wheeze Neuro: Awake and oriented X 3,  Psych:  Insight and Judgment appropriate.      Vicie Mutters, PA-C   03/19/2019

## 2019-03-19 ENCOUNTER — Other Ambulatory Visit: Payer: Self-pay

## 2019-03-19 ENCOUNTER — Encounter: Payer: Self-pay | Admitting: Physician Assistant

## 2019-03-19 ENCOUNTER — Ambulatory Visit: Payer: Medicare Other | Admitting: Physician Assistant

## 2019-03-19 DIAGNOSIS — F3341 Major depressive disorder, recurrent, in partial remission: Secondary | ICD-10-CM | POA: Diagnosis not present

## 2019-03-19 DIAGNOSIS — F332 Major depressive disorder, recurrent severe without psychotic features: Secondary | ICD-10-CM | POA: Diagnosis not present

## 2019-03-19 DIAGNOSIS — E348 Other specified endocrine disorders: Secondary | ICD-10-CM | POA: Diagnosis not present

## 2019-03-19 DIAGNOSIS — F17200 Nicotine dependence, unspecified, uncomplicated: Secondary | ICD-10-CM | POA: Diagnosis not present

## 2019-03-19 DIAGNOSIS — B009 Herpesviral infection, unspecified: Secondary | ICD-10-CM

## 2019-03-19 DIAGNOSIS — I10 Essential (primary) hypertension: Secondary | ICD-10-CM

## 2019-03-19 DIAGNOSIS — E782 Mixed hyperlipidemia: Secondary | ICD-10-CM | POA: Diagnosis not present

## 2019-03-19 DIAGNOSIS — R7309 Other abnormal glucose: Secondary | ICD-10-CM | POA: Diagnosis not present

## 2019-03-19 DIAGNOSIS — Z79899 Other long term (current) drug therapy: Secondary | ICD-10-CM | POA: Diagnosis not present

## 2019-03-19 DIAGNOSIS — E559 Vitamin D deficiency, unspecified: Secondary | ICD-10-CM | POA: Diagnosis not present

## 2019-03-19 DIAGNOSIS — S92901D Unspecified fracture of right foot, subsequent encounter for fracture with routine healing: Secondary | ICD-10-CM

## 2019-03-19 DIAGNOSIS — E079 Disorder of thyroid, unspecified: Secondary | ICD-10-CM

## 2019-03-19 MED ORDER — ALPRAZOLAM 1 MG PO TABS: ORAL_TABLET | ORAL | 0 refills | Status: DC

## 2019-03-19 MED ORDER — VALACYCLOVIR HCL 500 MG PO TABS: ORAL_TABLET | ORAL | 3 refills | Status: DC

## 2019-03-20 DIAGNOSIS — G5791 Unspecified mononeuropathy of right lower limb: Secondary | ICD-10-CM | POA: Diagnosis not present

## 2019-03-20 DIAGNOSIS — M84374D Stress fracture, right foot, subsequent encounter for fracture with routine healing: Secondary | ICD-10-CM | POA: Diagnosis not present

## 2019-04-14 DIAGNOSIS — M84374D Stress fracture, right foot, subsequent encounter for fracture with routine healing: Secondary | ICD-10-CM | POA: Diagnosis not present

## 2019-04-15 ENCOUNTER — Other Ambulatory Visit: Payer: Self-pay

## 2019-04-15 DIAGNOSIS — F909 Attention-deficit hyperactivity disorder, unspecified type: Secondary | ICD-10-CM

## 2019-04-15 NOTE — Telephone Encounter (Signed)
Patient states that she called last week requesting a refill for Ritalin, still nothing at the pharmacy. Please advise.

## 2019-04-17 MED ORDER — METHYLPHENIDATE HCL 20 MG PO TABS: ORAL_TABLET | ORAL | 0 refills | Status: DC

## 2019-04-17 NOTE — Telephone Encounter (Signed)
-----   Message from Elenor Quinones, [place] sent at 04/17/2019 12:41 PM EDT ----- Regarding: med refill Contact: [phone] PER PT/YELLOW NOTE:  Refill on RITALIN Please & thank you!  Pharmacy: [address]

## 2019-04-21 ENCOUNTER — Ambulatory Visit (INDEPENDENT_AMBULATORY_CARE_PROVIDER_SITE_OTHER): Payer: Medicare Other | Admitting: Physician Assistant

## 2019-04-21 ENCOUNTER — Encounter: Payer: Self-pay | Admitting: Physician Assistant

## 2019-04-21 ENCOUNTER — Other Ambulatory Visit: Payer: Self-pay

## 2019-04-21 VITALS — BP 114/76 | HR 77 | Temp 97.5°F | Ht 63.5 in | Wt 147.2 lb

## 2019-04-21 DIAGNOSIS — E559 Vitamin D deficiency, unspecified: Secondary | ICD-10-CM | POA: Diagnosis not present

## 2019-04-21 DIAGNOSIS — E782 Mixed hyperlipidemia: Secondary | ICD-10-CM

## 2019-04-21 DIAGNOSIS — R7309 Other abnormal glucose: Secondary | ICD-10-CM

## 2019-04-21 DIAGNOSIS — E611 Iron deficiency: Secondary | ICD-10-CM | POA: Diagnosis not present

## 2019-04-21 DIAGNOSIS — Z79899 Other long term (current) drug therapy: Secondary | ICD-10-CM

## 2019-04-21 DIAGNOSIS — E2839 Other primary ovarian failure: Secondary | ICD-10-CM | POA: Diagnosis not present

## 2019-04-21 DIAGNOSIS — R531 Weakness: Secondary | ICD-10-CM | POA: Diagnosis not present

## 2019-04-21 DIAGNOSIS — I10 Essential (primary) hypertension: Secondary | ICD-10-CM | POA: Diagnosis not present

## 2019-04-21 DIAGNOSIS — E538 Deficiency of other specified B group vitamins: Secondary | ICD-10-CM | POA: Diagnosis not present

## 2019-04-21 DIAGNOSIS — E079 Disorder of thyroid, unspecified: Secondary | ICD-10-CM | POA: Diagnosis not present

## 2019-04-21 MED ORDER — BIMATOPROST 0.03 % EX SOLN: CUTANEOUS | 12 refills | Status: DC

## 2019-04-21 NOTE — Patient Instructions (Signed)
Muscle aches Stop the statin for 1 week and then start back after we get the lab back. We are getting a lab that will look for muscle break down with the statin.  We will check your potassium, magnesium to see if these are low which can cause muscle aches.  Please make sure you are taking 64 oz of water a day as long as you do now have a heart condition.  -Please take Tylenol or Aleve for pain. -You can take tylenol (500mg ) or tylenol arthritis (650mg ). The max you can take of tylenol a day is 3000mg  daily, this is a max of 6 pills a day of the regular tyelnol (500mg ) or a max of 4 a day of the tylenol arthritis (650mg ) as long as no other medications you are taking contain tylenol.  -Aleve/Naproxen Sodium- can take 1-2 in the morning and 1 at night. Max 3 a day. Take with food to avoid ulcers. Does affect your kidney function so use sparingly.   If you have had tick exposure, we can check for lyme disease or [place] spotted fever.   Muscle Pain Muscle pain (myalgia) may be caused by many things, including:  Overuse or muscle strain, especially if you are not in shape. This is the most common cause of muscle pain.  Injury.  Bruises.  Viruses, such as the flu.  Infectious diseases.  Fibromyalgia, which is a chronic condition that causes muscle tenderness, fatigue, and headache.  Autoimmune diseases, including lupus.  Certain drugs, including ACE inhibitors and statins. Muscle pain may be mild or severe. In most cases, the pain lasts only a short time and goes away without treatment. To diagnose the cause of your muscle pain, your health care provider will take your medical history. This means he or she will ask you when your muscle pain began and what has been happening. If you have not had muscle pain for very long, your health care provider may want to wait before doing much testing. If your muscle pain has lasted a long time, your health care provider may want to run tests right  away. If your health care provider thinks your muscle pain may be caused by illness, you may need to have additional tests to rule out certain conditions.  Treatment for muscle pain depends on the cause. Home care is often enough to relieve muscle pain. Your health care provider may also prescribe anti-inflammatory medicine. HOME CARE INSTRUCTIONS Watch your condition for any changes. The following actions may help to lessen any discomfort you are feeling:  Only take over-the-counter or prescription medicines as directed by your health care provider.  Apply ice to the sore muscle:  Put ice in a plastic bag.  Place a towel between your skin and the bag.  Leave the ice on for 15-20 minutes, 3-4 times a day.  You may alternate applying hot and cold packs to the muscle as directed by your health care provider.  If overuse is causing your muscle pain, slow down your activities until the pain goes away.  Remember that it is normal to feel some muscle pain after starting a workout program. Muscles that have not been used often will be sore at first.  Do regular, gentle exercises if you are not usually active.  Warm up before exercising to lower your risk of muscle pain.  Do not continue working out if the pain is very bad. Bad pain could mean you have injured a muscle. [place]  IF:  Your muscle pain gets worse, and medicines do not help.  You have muscle pain that lasts longer than 3 days.  You have a rash or fever along with muscle pain.  You have muscle pain after a tick bite.  You have muscle pain while working out, even though you are in good physical condition.  You have redness, soreness, or swelling along with muscle pain.  You have muscle pain after starting a new medicine or changing the dose of a medicine. SEEK IMMEDIATE MEDICAL CARE IF:  You have trouble breathing.  You have trouble swallowing.  You have muscle pain along with a stiff neck, fever, and  vomiting.  You have severe muscle weakness or cannot move part of your body. MAKE SURE YOU:   Understand these instructions.  Will watch your condition.  Will get help right away if you are not doing well or get worse. Document Released: 08/03/2006 Document Revised: 09/16/2013 Document Reviewed: 07/08/2013 [facility] Patient Information 2015 [place], [place]. This information is not intended to replace advice given to you by your health care provider. Make sure you discuss any questions you have with your health care provider.    Weakness Weakness is a lack of strength. You may feel weak all over your body (generalized), or you may feel weak in one specific part of your body (focal). Common causes of weakness include:  Infection and immune system disorders.  Physical exhaustion.  Internal bleeding or other blood loss that results in a lack of red blood cells (anemia).  Dehydration.  An imbalance in mineral (electrolyte) levels, such as potassium.  Heart disease, circulation problems, or stroke. Other causes include:  Some medicines or cancer treatment.  Stress, anxiety, or depression.  Nervous system disorders.  Thyroid disorders.  Loss of muscle strength because of age or inactivity.  Poor sleep quality or sleep disorders. The cause of your weakness may not be known. Some causes of weakness can be serious, so it is important to see your health care provider. Follow these instructions at home: Activity  Rest as needed.  Try to get enough sleep. Most adults need 7-8 hours of quality sleep each night. Talk to your health care provider about how much sleep you need each night.  Do exercises, such as arm curls and leg raises, for 30 minutes at least 2 days a week or as told by your health care provider. This helps build muscle strength.  Consider working with a physical therapist or trainer who can develop an exercise plan to help you gain muscle strength. General  instructions   Take over-the-counter and prescription medicines only as told by your health care provider.  Eat a healthy, well-balanced diet. This includes: ? Proteins to build muscles, such as lean meats and fish. ? Fresh fruits and vegetables. ? Carbohydrates to boost energy, such as whole grains.  Drink enough fluid to keep your urine pale yellow.  Keep all follow-up visits as told by your health care provider. This is important. Contact a health care provider if your weakness:  Does not improve or gets worse.  Affects your ability to think clearly.  Affects your ability to do your normal daily activities. Get help right away if you:  Develop sudden weakness, especially on one side of your face or body.  Have chest pain.  Have trouble breathing or shortness of breath.  Have problems with your vision.  Have trouble talking or swallowing.  Have trouble standing or walking.  Are light-headed  or lose consciousness. Summary  Weakness is a lack of strength. You may feel weak all over your body or just in one specific part of your body.  Weakness can be caused by a variety of things. In some cases, the cause may be unknown.  Rest as needed, and try to get enough sleep. Most adults need 7-8 hours of quality sleep each night.  Eat a healthy, well-balanced diet. This information is not intended to replace advice given to you by your health care provider. Make sure you discuss any questions you have with your health care provider. Document Released: 09/11/2005 Document Revised: 04/17/2018 Document Reviewed: 04/17/2018 Elsevier Patient Education  2020 [employer].

## 2019-04-21 NOTE — Progress Notes (Signed)
FOLLOW UP   Assessment:   Hyperlipidemia, mixed -     Lipid panel -     CK -     EKG 12-Lead check lipids decrease fatty foods increase activity.   Hypothyroidism -     TSH Hypothyroidism-check TSH level, continue medications the same, reminded to take on an empty stomach 30-26mins before food.   Essential hypertension -     CBC with Differential/Platelet -     COMPLETE METABOLIC PANEL WITH GFR -     TSH -     EKG 12-Lead - continue medications, DASH diet, exercise and monitor at home. Call if greater than 130/80.   Medication management -     Magnesium  Abnormal glucose -     Hemoglobin A1c Discussed disease progression and risks Discussed diet/exercise, weight management and risk modification  Vitamin D deficiency -     VITAMIN D 25 Hydroxy (Vit-D Deficiency, Fractures)  Estrogen deficiency -     HAS SET UP IN FEB DG Bone Density; Future  Iron deficiency -     Iron,Total/Total Iron Binding Cap -     Ferritin  B12 deficiency -     Vitamin B12  Weakness CHECK LABS EKG NEGATIVE Go to the ER if any chest pain, shortness of breath, nausea, dizziness, severe HA, changes vision/speech -     Rocky mtn spotted fvr abs pnl(IgG+IgM) -     Ehrlichia antibody panel -     B. burgdorfi antibodies -     EKG 12-Lead  Other orders -     bimatoprost (LATISSE) 0.03 % ophthalmic solution; 1 drop on applicator, apply evenly along the skin of the upper eyelid at base of eyelashes at bedtime; repeat procedure for second eye     Over 30 minutes of exam, counseling, chart review, and critical decision making was performed  Future Appointments  Date Time Provider [place]  06/16/2019  4:00 PM [facility] DX DEXA 1 [facility]  06/24/2019  2:00 PM Unk Pinto, MD [facility] None    Subjective:   Margaret Nelson is a 68 y.o. female who presents for 3 month follow up on hypertension, prediabetes, hyperlipidemia, vitamin D def.   Her blood pressure has  been controlled at home, today their BP is BP: 114/76 She does workout. She denies chest pain, shortness of breath, dizziness.  She tripped on concrete 1 week ago and broke her foot, saw podiatry. She has never had a DEXA. She states her foot feels like it is concrete and heavy when she walks.   She also states she goes through "spells of weakness". She states she has been taking more of her statin drug. States is not with exertion, can be with doing nothing, just feels all over weak.    She has cough from allergies, she smokes jewel now, recurrent bronchitis. Not on inhaler.   She is following with Dr. Krista Blue.    She is on cholesterol medication and denies myalgias. Her cholesterol is not at goal. The cholesterol last visit was:  She is on 1/2 of lipitor everyday. .  Lab Results  Component Value Date   CHOL 239 (H) 12/03/2018   HDL 60 12/03/2018   LDLCALC 145 (H) 12/03/2018   TRIG 203 (H) 12/03/2018   CHOLHDL 4.0 12/03/2018   Last A1C Lab Results  Component Value Date   HGBA1C 5.6 12/03/2018   Patient is on Vitamin D supplement. Lab Results  Component Value Date   VD25OH 35  12/03/2018      BMI is Body mass index is 25.67 kg/m., she is working on diet and exercise. Wt Readings from Last 3 Encounters:  04/21/19 147 lb 3.2 oz (66.8 kg)  12/03/18 144 lb (65.3 kg)  05/21/18 135 lb (61.2 kg)   She is on thyroid medication. Her medication was not changed last visit, she is on 1.5 pills a day, takes with coffee in the AM.    Lab Results  Component Value Date   TSH 4.07 12/03/2018  .     Medication Review Current Outpatient Medications on File Prior to Visit  Medication Sig Dispense Refill  . albuterol (PROAIR HFA) 108 (90 Base) MCG/ACT inhaler inhale 2 puffs by mouth every 6 hours if needed for wheezing or shortness of breath 8.5 g 2  . ALPRAZolam (XANAX) 1 MG tablet take 1/2 to 1 tablets by daily for anxiety 120 tablet 0  . atorvastatin (LIPITOR) 80 MG tablet TAKE 1/2 TO  1 TABLET BY MOUTH EVERY DAY AS NEEDED FOR CHOLESTEROL 90 tablet 0  . bumetanide (BUMEX) 2 MG tablet Take 1 tablet 2 x /day fr BP & Fluid 180 tablet 1  . buPROPion (WELLBUTRIN XL) 300 MG 24 hr tablet take 1 tablet by mouth once daily (Patient taking differently: Take 150 mg by mouth 3 (three) times daily. ) 90 tablet 1  . Cholecalciferol (VITAMIN D PO) Take 5,000 Units by mouth daily.    . Flaxseed, Linseed, (FLAX SEED OIL) 1300 MG CAPS Take 1,300 mg by mouth 3 (three) times daily.    . fluticasone furoate-vilanterol (BREO ELLIPTA) 200-25 MCG/INH AEPB Inhale 1 puff into the lungs daily. 60 each 11  . levothyroxine (SYNTHROID) 50 MCG tablet Take 1 to 1&1/2 tablet daily & Take on an empty stomach with only water for 30 minutes & no Antacid meds, Calcium or Magnesium for 4 hours & avoid Biotin 135 tablet 0  . methylphenidate (RITALIN) 20 MG tablet Take 1/2-1 tablet 2  x /day as directed for depression.  Do not take more than 5 days per week . Should last longer than 30 days. 60 tablet 0  . Omega-3 Fatty Acids (FISH OIL) 1000 MG CAPS Take 1 capsule (1,000 mg total) by mouth 3 (three) times daily.  0  . potassium chloride SA (K-DUR,KLOR-CON) 20 MEQ tablet TAKE 1 TABLET BY MOUTH THREE TIMES DAILY 270 tablet 1  . valACYclovir (VALTREX) 500 MG tablet Take two tablets by mouth twice a day at onset of symptoms. 30 tablet 3  . Venlafaxine HCl (EFFEXOR PO) Take 75 mg by mouth daily.      No current facility-administered medications on file prior to visit.     Allergies: Allergies  Allergen Reactions  . Codeine Other (See Comments)    Abdominal pain  . Erythromycin Base     GI upset  . Penicillins Rash    Current Problems (verified) has Essential hypertension; Hyperlipidemia, mixed; Depression, major, recurrent, in partial remission ([address]); Vitamin D deficiency; Hypothyroidism; Medication management; Gastroesophageal reflux disease; Smoker; Screening for colorectal cancer; Extrinsic asthma; Body mass  index (BMI) of 23.0-23.9 in adult; Severe recurrent major depression without psychotic features ([place]); Abnormal glucose; Screening for ischemic heart disease; and Attention deficit hyperactivity disorder (ADHD) on their problem list.  Surgical: She  has a past surgical history that includes Cholecystectomy (1975); Nasal sinus surgery (1996); Liposuction (1998); Breast surgery (Bilateral, 1983); Breast reconstruction (2008); Appendectomy; bil breast lumpectomy; bilateral breast masectomy (no breast cancer); and EUS (N/A,  04/23/2014). Family Her family history includes ADD / ADHD in her son; Alcohol abuse in her father and paternal uncle; Anxiety disorder in her sister; COPD in her mother; Dementia in her mother; Depression in her mother and sister; Diabetes in her sister; Early death in her father and sister; Emphysema in her mother; Lung cancer in her mother; Schizophrenia in her sister. Social history  She reports that she has been smoking e-cigarettes. She has been smoking about 0.50 packs per day. She has never used smokeless tobacco. She reports current alcohol use of about 4.0 - 5.0 standard drinks of alcohol per week. She reports that she does not use drugs.    Objective:   Today's Vitals   04/21/19 1536  BP: 114/76  Pulse: 77  Temp: (!) 97.5 F (36.4 C)  SpO2: 97%  Weight: 147 lb 3.2 oz (66.8 kg)  Height: 5' 3.5" (1.613 m)   Body mass index is 25.67 kg/m. General appearance: alert, no distress, WD/WN, female HEENT: normocephalic, sclerae anicteric, TMs pearly, nares patent, no discharge or erythema, pharynx normal Oral cavity: MMM, no lesions Neck: supple, no lymphadenopathy, no thyromegaly, no masses Heart: RRR, normal S1, S2, no murmurs Lungs: CTA bilaterally, no wheezes, rhonchi, or rales Abdomen: +bs, soft, non tender, non distended, no masses, no hepatomegaly, no splenomegaly Musculoskeletal: nontender, no swelling, no obvious deformity Extremities: no edema, no cyanosis,  no clubbing Pulses: 2+ symmetric, upper and lower extremities, normal cap refill Neurological: alert, oriented x 3, CN2-12 intact, strength normal upper extremities and lower extremities, sensation normal throughout, DTRs 2+ throughout, no cerebellar signs, gait normal Psychiatric: normal affect, behavior normal, pleasant    Vicie Mutters, PA-C   04/21/2019

## 2019-04-24 DIAGNOSIS — F330 Major depressive disorder, recurrent, mild: Secondary | ICD-10-CM | POA: Diagnosis not present

## 2019-04-25 LAB — CBC WITH DIFFERENTIAL/PLATELET
Absolute Monocytes: 548 cells/uL (ref 200–950)
Basophils Absolute: 83 cells/uL (ref 0–200)
Basophils Relative: 1 %
Eosinophils Absolute: 324 cells/uL (ref 15–500)
Eosinophils Relative: 3.9 %
HCT: 36.5 % (ref 35.0–45.0)
Hemoglobin: 12.4 g/dL (ref 11.7–15.5)
Lymphs Abs: 3486 cells/uL (ref 850–3900)
MCH: 31.2 pg (ref 27.0–33.0)
MCHC: 34 g/dL (ref 32.0–36.0)
MCV: 91.7 fL (ref 80.0–100.0)
MPV: 11.1 fL (ref 7.5–12.5)
Monocytes Relative: 6.6 %
Neutro Abs: 3860 cells/uL (ref 1500–7800)
Neutrophils Relative %: 46.5 %
Platelets: 289 10*3/uL (ref 140–400)
RBC: 3.98 10*6/uL (ref 3.80–5.10)
RDW: 12.8 % (ref 11.0–15.0)
Total Lymphocyte: 42 %
WBC: 8.3 10*3/uL (ref 3.8–10.8)

## 2019-04-25 LAB — EHRLICHIA ANTIBODY PANEL
E. CHAFFEENSIS AB IGG: <1:64 {titer}
E. CHAFFEENSIS AB IGM: <1:20 {titer}

## 2019-04-25 LAB — COMPLETE METABOLIC PANEL WITH GFR
AG Ratio: 1.6 (calc) (ref 1.0–2.5)
ALT: 15 U/L (ref 6–29)
AST: 15 U/L (ref 10–35)
Albumin: 4.2 g/dL (ref 3.6–5.1)
Alkaline phosphatase (APISO): 84 U/L (ref 37–153)
BUN: 19 mg/dL (ref 7–25)
CO2: 28 mmol/L (ref 20–32)
Calcium: 8.9 mg/dL (ref 8.6–10.4)
Chloride: 99 mmol/L (ref 98–110)
Creat: 0.78 mg/dL (ref 0.50–0.99)
GFR, Est African American: 92 mL/min/{1.73_m2} (ref >=60)
GFR, Est Non African American: 79 mL/min/{1.73_m2} (ref >=60)
Globulin: 2.6 g/dL (calc) (ref 1.9–3.7)
Glucose, Bld: 91 mg/dL (ref 65–99)
Potassium: 3.5 mmol/L (ref 3.5–5.3)
Sodium: 141 mmol/L (ref 135–146)
Total Bilirubin: 0.4 mg/dL (ref 0.2–1.2)
Total Protein: 6.8 g/dL (ref 6.1–8.1)

## 2019-04-25 LAB — LIPID PANEL
Cholesterol: 192 mg/dL (ref <200)
HDL: 58 mg/dL (ref >=50)
LDL Cholesterol (Calc): 100 mg/dL (calc) — ABNORMAL HIGH
Non-HDL Cholesterol (Calc): 134 mg/dL (calc) — ABNORMAL HIGH (ref <130)
Total CHOL/HDL Ratio: 3.3 (calc) (ref <5.0)
Triglycerides: 220 mg/dL — ABNORMAL HIGH (ref <150)

## 2019-04-25 LAB — FERRITIN: Ferritin: 127 ng/mL (ref 16–288)

## 2019-04-25 LAB — HEMOGLOBIN A1C
Hgb A1c MFr Bld: 5.6 % of total Hgb (ref <5.7)
Mean Plasma Glucose: 114 (calc)
eAG (mmol/L): 6.3 (calc)

## 2019-04-25 LAB — B. BURGDORFI ANTIBODIES: B burgdorferi Ab IgG+IgM: <0.9 index

## 2019-04-25 LAB — IRON, TOTAL/TOTAL IRON BINDING CAP
%SAT: 31 % (calc) (ref 16–45)
Iron: 106 ug/dL (ref 45–160)
TIBC: 342 mcg/dL (calc) (ref 250–450)

## 2019-04-25 LAB — VITAMIN D 25 HYDROXY (VIT D DEFICIENCY, FRACTURES): Vit D, 25-Hydroxy: 54 ng/mL (ref 30–100)

## 2019-04-25 LAB — TSH: TSH: 1.11 mIU/L (ref 0.40–4.50)

## 2019-04-25 LAB — ROCKY MTN SPOTTED FVR ABS PNL(IGG+IGM)
RMSF IgG: NOT DETECTED
RMSF IgM: NOT DETECTED

## 2019-04-25 LAB — CK: Total CK: 52 U/L (ref 29–143)

## 2019-04-25 LAB — VITAMIN B12: Vitamin B-12: 651 pg/mL (ref 200–1100)

## 2019-04-25 LAB — MAGNESIUM: Magnesium: 1.2 mg/dL — ABNORMAL LOW (ref 1.5–2.5)

## 2019-04-28 ENCOUNTER — Telehealth: Payer: Self-pay | Admitting: Physician Assistant

## 2019-04-28 NOTE — Telephone Encounter (Signed)
She will try to increase the magnesium, to see if this helps leg weakness.  If her leg weakness if not better we will refer to neurology for evaluation.   Go to the ER if you have any new weakness in your legs, have trouble controlling your urine or bowels, or have worsening pain.

## 2019-05-19 NOTE — Progress Notes (Signed)
Assessment and Plan:  Leg Weakness Improved with magnesium- normal neuro exam Still some leg weakness just with standing subjectively ? Coming from her back- will monitor, may need to follow up ortho -     CBC with Differential/Platelet -     COMPLETE METABOLIC PANEL WITH GFR -     Magnesium  Gastroesophageal reflux disease with esophagitis Get back on PPI May need follow up GI Avoid ETOH, etc -     esomeprazole (NEXIUM) 20 MG capsule; Take 1 capsule (20 mg total) by mouth daily. -     famotidine (PEPCID) 40 MG tablet; Take 1 tablet (40 mg total) by mouth at bedtime.  Hypomagnesemia -     Magnesium - likely from bumex, take AS needed and not dily  Insomnia, unspecified type -     amitriptyline (ELAVIL) 50 MG tablet; Take 1 tablet (50 mg total) by mouth at bedtime as needed for sleep.  Cough -     DG Chest 2 View; Future Possibly from LPR, lungs CTAB no fever, chills, SOB.  - get back on PPI, if not better or any new symptoms get CXR or go to ER      HPI 68 y.o.female presents for follow up for weakness.  She was started on magnesium but states she is still having leg muscle weakness. She has lower back pain as well. States muscle weakness is better, she has some leg weakness with standing for a long time better with moving.  She has had trouble sleeping, states trazodone does not help.  She has had some diarrhea with with the high dose of magnesium.  She also stopped her GERD med due to low mag, she is on bumex as needed not taking in a week.  Lab Results  Component Value Date   MG 1.2 (L) 04/21/2019   Labs from last office visit: Office Visit on 04/21/2019  Component Date Value Ref Range Status  . WBC 04/21/2019 8.3  3.8 - 10.8 Thousand/uL Final  . RBC 04/21/2019 3.98  3.80 - 5.10 Million/uL Final  . Hemoglobin 04/21/2019 12.4  11.7 - 15.5 g/dL Final  . HCT 04/21/2019 36.5  35.0 - 45.0 % Final  . MCV 04/21/2019 91.7  80.0 - 100.0 fL Final  . MCH 04/21/2019 31.2   27.0 - 33.0 pg Final  . MCHC 04/21/2019 34.0  32.0 - 36.0 g/dL Final  . RDW 04/21/2019 12.8  11.0 - 15.0 % Final  . Platelets 04/21/2019 289  140 - 400 Thousand/uL Final  . MPV 04/21/2019 11.1  7.5 - 12.5 fL Final  . Neutro Abs 04/21/2019 3,860  1,500 - 7,800 cells/uL Final  . Lymphs Abs 04/21/2019 3,486  850 - 3,900 cells/uL Final  . Absolute Monocytes 04/21/2019 548  200 - 950 cells/uL Final  . Eosinophils Absolute 04/21/2019 324  15 - 500 cells/uL Final  . Basophils Absolute 04/21/2019 83  0 - 200 cells/uL Final  . Neutrophils Relative % 04/21/2019 46.5  % Final  . Total Lymphocyte 04/21/2019 42.0  % Final  . Monocytes Relative 04/21/2019 6.6  % Final  . Eosinophils Relative 04/21/2019 3.9  % Final  . Basophils Relative 04/21/2019 1.0  % Final  . Glucose, Bld 04/21/2019 91  65 - 99 mg/dL Final   Comment: .            Fasting reference interval .   . BUN 04/21/2019 19  7 - 25 mg/dL Final  . Creat 04/21/2019 0.78  0.50 -  0.99 mg/dL Final   Comment: For patients >65 years of age, the reference limit for Creatinine is approximately 13% higher for people identified as African-American. .   . GFR, Est Non African American 04/21/2019 79  > OR = 60 mL/min/1.84m2 Final  . GFR, Est African American 04/21/2019 92  > OR = 60 mL/min/1.27m2 Final  . BUN/Creatinine Ratio [id number] NOT APPLICABLE  6 - 22 (calc) Final  . Sodium 04/21/2019 141  135 - 146 mmol/L Final  . Potassium 04/21/2019 3.5  3.5 - 5.3 mmol/L Final  . Chloride 04/21/2019 99  98 - 110 mmol/L Final  . CO2 04/21/2019 28  20 - 32 mmol/L Final  . Calcium 04/21/2019 8.9  8.6 - 10.4 mg/dL Final  . Total Protein 04/21/2019 6.8  6.1 - 8.1 g/dL Final  . Albumin 04/21/2019 4.2  3.6 - 5.1 g/dL Final  . Globulin 04/21/2019 2.6  1.9 - 3.7 g/dL (calc) Final  . AG Ratio 04/21/2019 1.6  1.0 - 2.5 (calc) Final  . Total Bilirubin 04/21/2019 0.4  0.2 - 1.2 mg/dL Final  . Alkaline phosphatase (APISO) 04/21/2019 84  37 - 153 U/L Final  . AST  04/21/2019 15  10 - 35 U/L Final  . ALT 04/21/2019 15  6 - 29 U/L Final  . TSH 04/21/2019 1.11  0.40 - 4.50 mIU/L Final  . Cholesterol 04/21/2019 192  <200 mg/dL Final  . HDL 04/21/2019 58  > OR = 50 mg/dL Final  . Triglycerides 04/21/2019 220* <150 mg/dL Final   Comment: . If a non-fasting specimen was collected, consider repeat triglyceride testing on a fasting specimen if clinically indicated.  Yates Decamp et al. J. of Clin. Lipidol. [id number]. .   . LDL Cholesterol (Calc) 04/21/2019 100* mg/dL (calc) Final   Comment: Reference range: <100 . Desirable range <100 mg/dL for primary prevention;   <70 mg/dL for patients with CHD or diabetic patients  with > or = 2 CHD risk factors. Marland Kitchen LDL-C is now calculated using the Martin-Hopkins  calculation, which is a validated novel method providing  better accuracy than the Friedewald equation in the  estimation of LDL-C.  Cresenciano Genre et al. Annamaria Helling. [id number]): 2061-2068  (http://education.[facility].com/faq/FAQ164)   . Total CHOL/HDL Ratio 04/21/2019 3.3  <5.0 (calc) Final  . Non-HDL Cholesterol (Calc) 04/21/2019 134* <130 mg/dL (calc) Final   Comment: For patients with diabetes plus 1 major ASCVD risk  factor, treating to a non-HDL-C goal of <100 mg/dL  (LDL-C of <70 mg/dL) is considered a therapeutic  option.   . Hgb A1c MFr Bld 04/21/2019 5.6  <5.7 % of total Hgb Final   Comment: For the purpose of screening for the presence of diabetes: . <5.7%       Consistent with the absence of diabetes 5.7-6.4%    Consistent with increased risk for diabetes             (prediabetes) > or =6.5%  Consistent with diabetes . This assay result is consistent with a decreased risk of diabetes. . Currently, no consensus exists regarding use of hemoglobin A1c for diagnosis of diabetes in children. . According to American Diabetes Association (ADA) guidelines, hemoglobin A1c <7.0% represents optimal control in non-pregnant diabetic  patients. Different metrics may apply to specific patient populations.  Standards of Medical Care in Diabetes(ADA). .   . Mean Plasma Glucose 04/21/2019 114  (calc) Final  . eAG (mmol/L) 04/21/2019 6.3  (calc) Final  . Magnesium 04/21/2019 1.2* 1.5 - 2.5 mg/dL Final  .  Vit D, 25-Hydroxy 04/21/2019 54  30 - 100 ng/mL Final   Comment: Vitamin D Status         25-OH Vitamin D: . Deficiency:                    <20 ng/mL Insufficiency:             20 - 29 ng/mL Optimal:                 > or = 30 ng/mL . For 25-OH Vitamin D testing on patients on  D2-supplementation and patients for whom quantitation  of D2 and D3 fractions is required, the [facility](TM) 25-OH VIT D, (D2,D3), LC/MS/MS is recommended: order  code [phone] (patients >33yrs). See Note 1 . Note 1 . For additional information, please refer to  http://education.[facility].com/faq/FAQ199  (This link is being provided for informational/ educational purposes only.)   . Total CK 04/21/2019 52  29 - 143 U/L Final  . Iron 04/21/2019 106  45 - 160 mcg/dL Final  . TIBC 04/21/2019 342  250 - 450 mcg/dL (calc) Final  . %SAT 04/21/2019 31  16 - 45 % (calc) Final  . Ferritin 04/21/2019 127  16 - 288 ng/mL Final  . Vitamin B-12 04/21/2019 651  200 - 1,100 pg/mL Final  . RMSF IgG 04/21/2019 NOT DETECTED  NOT DETECT Final  . RMSF IgM 04/21/2019 NOT DETECTED  NOT DETECT Final  . E. CHAFFEENSIS AB IGG 04/21/2019 <1:64  <1:64 Final  . E. CHAFFEENSIS AB IGM 04/21/2019 <1:20  <1:20 Final  . INTERPRETATION 04/21/2019 see note   Final   Comment: Antibody Not Detected .   Marland Kitchen COMMENT 04/21/2019 see note   Final   Comment: . Ehrlichia chaffeenis has been identified as the causative agent of Human Monocytic Ehrlichiosis (HME). Infected individuals produce specific antibodies to E. chaffeensis that can be detected by an immuno- fluorescent antibody (IFA) test.  Single IgG IFA titers of 1:64 or greater indicate exposure to E.  chaffeensis.  A four-fold rise in IgG titers between acute and convalescent samples and/or the presence of IgM antibody against E. chaffeensis suggest recent or current infection. . This test was developed and its analytical performance characteristics have been determined by [facility], [place], [place]. It has not been cleared or approved by the U.S. Food and Drug Administration. This assay has been validated pursuant to the CLIA regulations and is used for clinical purposes. .   . B burgdorferi Ab IgG+IgM 04/21/2019 <0.90  index Final   Comment:                    Index                Interpretation                    -----                --------------                    < 0.90               Negative                    0.90-1.09            Equivocal                    >  1.09               Positive . As recommended by the Food and Drug Administration  (FDA), all samples with positive or equivocal  results in a Borrelia burgdorferi antibody screen will be tested using a blot method. Positive or  equivocal screening test results should not be  interpreted as truly positive until verified as such  using a supplemental assay (e.g., B. burgdorferi blot). . The screening test and/or blot for B. burgdorferi  antibodies may be falsely negative in early stages of Lyme disease, including the period when erythema  migrans is apparent. .       Past Medical History:  Diagnosis Date  . Allergy   . Anxiety   . Arthritis   . Asthma    as a child, out grew   . Blood transfusion without reported diagnosis   . Cancer ([place])    basal cell left temple, left side of nose  . Depression   . GERD (gastroesophageal reflux disease)   . Hyperlipidemia   . Hypertension   . Thyroid disease    hypo  . Tubular adenoma of colon      Allergies  Allergen Reactions  . Codeine Other (See Comments)    Abdominal pain  . Erythromycin Base     GI upset  . Penicillins  Rash    Current Outpatient Medications on File Prior to Visit  Medication Sig  . albuterol (PROAIR HFA) 108 (90 Base) MCG/ACT inhaler inhale 2 puffs by mouth every 6 hours if needed for wheezing or shortness of breath  . ALPRAZolam (XANAX) 1 MG tablet take 1/2 to 1 tablets by daily for anxiety  . atorvastatin (LIPITOR) 80 MG tablet TAKE 1/2 TO 1 TABLET BY MOUTH EVERY DAY AS NEEDED FOR CHOLESTEROL  . bimatoprost (LATISSE) 0.03 % ophthalmic solution 1 drop on applicator, apply evenly along the skin of the upper eyelid at base of eyelashes at bedtime; repeat procedure for second eye  . bumetanide (BUMEX) 2 MG tablet Take 1 tablet 2 x /day fr BP & Fluid  . buPROPion (WELLBUTRIN XL) 300 MG 24 hr tablet take 1 tablet by mouth once daily (Patient taking differently: Take 150 mg by mouth 3 (three) times daily. )  . Cholecalciferol (VITAMIN D PO) Take 5,000 Units by mouth daily.  . Flaxseed, Linseed, (FLAX SEED OIL) 1300 MG CAPS Take 1,300 mg by mouth 3 (three) times daily.  . fluticasone furoate-vilanterol (BREO ELLIPTA) 200-25 MCG/INH AEPB Inhale 1 puff into the lungs daily.  Marland Kitchen levothyroxine (SYNTHROID) 50 MCG tablet Take 1 to 1&1/2 tablet daily & Take on an empty stomach with only water for 30 minutes & no Antacid meds, Calcium or Magnesium for 4 hours & avoid Biotin  . methylphenidate (RITALIN) 20 MG tablet Take 1/2-1 tablet 2  x /day as directed for depression.  Do not take more than 5 days per week . Should last longer than 30 days.  . Omega-3 Fatty Acids (FISH OIL) 1000 MG CAPS Take 1 capsule (1,000 mg total) by mouth 3 (three) times daily.  . potassium chloride SA (K-DUR,KLOR-CON) 20 MEQ tablet TAKE 1 TABLET BY MOUTH THREE TIMES DAILY  . valACYclovir (VALTREX) 500 MG tablet Take two tablets by mouth twice a day at onset of symptoms.  . Venlafaxine HCl (EFFEXOR PO) Take 75 mg by mouth daily.    No current facility-administered medications on file prior to visit.     ROS: all negative except  above.   Physical Exam: Filed Weights   05/21/19 1537  Weight: 147 lb 12.8 oz (67 kg)   BP 130/80   Pulse (!) 102   Temp (!) 97.5 F (36.4 C)   Ht 5' 3.5" (1.613 m)   Wt 147 lb 12.8 oz (67 kg)   SpO2 98%   BMI 25.77 kg/m  General Appearance: Well nourished, in no apparent distress. Eyes: PERRLA, EOMs, conjunctiva no swelling or erythema Sinuses: No Frontal/maxillary tenderness ENT/Mouth: Ext aud canals clear, TMs without erythema, bulging. No erythema, swelling, or exudate on post pharynx.  Tonsils not swollen or erythematous. Hearing normal.  Neck: Supple, thyroid normal.  Respiratory: Respiratory effort normal, BS equal bilaterally without rales, rhonchi, wheezing or stridor.  Cardio: RRR with no MRGs. Brisk peripheral pulses without edema.  Abdomen: Soft, + BS.  Non tender, no guarding, rebound, hernias, masses. Lymphatics: Non tender without lymphadenopathy.  Musculoskeletal: Full ROM, 5/5 strength, normal gait.  Skin: Warm, dry without rashes, lesions, ecchymosis.  Neuro: Cranial nerves intact. Normal muscle tone, no cerebellar symptoms. Sensation intact.  Psych: Awake and oriented X 3, normal affect, Insight and Judgment appropriate.     Vicie Mutters, PA-C 4:42 PM [facility]

## 2019-05-21 ENCOUNTER — Other Ambulatory Visit: Payer: Self-pay

## 2019-05-21 ENCOUNTER — Ambulatory Visit (INDEPENDENT_AMBULATORY_CARE_PROVIDER_SITE_OTHER): Payer: Medicare Other | Admitting: Physician Assistant

## 2019-05-21 ENCOUNTER — Encounter: Payer: Self-pay | Admitting: Physician Assistant

## 2019-05-21 VITALS — BP 130/80 | HR 102 | Temp 97.5°F | Ht 63.5 in | Wt 147.8 lb

## 2019-05-21 DIAGNOSIS — R531 Weakness: Secondary | ICD-10-CM | POA: Diagnosis not present

## 2019-05-21 DIAGNOSIS — G4700 Insomnia, unspecified: Secondary | ICD-10-CM | POA: Diagnosis not present

## 2019-05-21 DIAGNOSIS — R059 Cough, unspecified: Secondary | ICD-10-CM

## 2019-05-21 DIAGNOSIS — K2100 Gastro-esophageal reflux disease with esophagitis, without bleeding: Secondary | ICD-10-CM

## 2019-05-21 DIAGNOSIS — R05 Cough: Secondary | ICD-10-CM | POA: Diagnosis not present

## 2019-05-21 MED ORDER — ESOMEPRAZOLE MAGNESIUM 20 MG PO CPDR: 20.0000 mg | DELAYED_RELEASE_CAPSULE | Freq: Every day | ORAL | 1 refills | Status: DC

## 2019-05-21 MED ORDER — AMITRIPTYLINE HCL 50 MG PO TABS: 50.0000 mg | ORAL_TABLET | Freq: Every evening | ORAL | 0 refills | Status: DC | PRN

## 2019-05-21 MED ORDER — FAMOTIDINE 40 MG PO TABS: 40.0000 mg | ORAL_TABLET | Freq: Every day | ORAL | 1 refills | Status: DC

## 2019-05-21 NOTE — Patient Instructions (Addendum)
Try amitriptyline for sleep as needed, can do 1/2 -1 tablet  Ways to prevent diarrhea with magnesium:  1) Don't take all your magnesium at the same time, have 2-3 smaller doses through out the day 2) Try taking your magnesium with high fiber meals.  3) If this does not help, take the magnesium on an empty stomach. Fiber for some people can bind the magnesium too well and prevent absorption in your gut.  4) Lastly try different types of magnesium. Most people are taking magnesium citrate, you can also try dimalate capsules which are slow release. You can also find magnesium lotions/sprays for the skin that bypass the gut. Another one that has good absorption is ReMag (pico-iconic magnesium formula), this has great cellular absorption so less of a laxative effect. You can find these type at health food stores or online.    Take the nexium 20 mg every other day with pepcid 40mg  on the days that you are not taking the nexium and see how you do.   If you have to you can do nexium 20 mg every day and the pepcid at night.   Avoid alcohol, spicy foods, NSAIDS (aleve, ibuprofen) at this time.  See foods below.   GET ON THE REFLUX MEDICATION, IF YOU ARE STILL COUGHING 1-2 WEEKS OR ANY CHANGES THEN WE WILL GET A CHEST XRAY INFORMATION ABOUT YOUR XRAY  Can walk into [address] for an Insurance account manager. They will have the order and take you back. You do not any paper work, I should get the result back today or tomorrow. This order is good for a year.  Can call [phone] to schedule an appointment if you wish.    Food Choices for Gastroesophageal Reflux Disease When you have gastroesophageal reflux disease (GERD), the foods you eat and your eating habits are very important. Choosing the right foods can help ease the discomfort of GERD. WHAT GENERAL GUIDELINES DO I NEED TO FOLLOW?  Choose fruits, vegetables, whole grains, low-fat dairy products, and low-fat meat, fish, and poultry.  Limit fats  such as oils, salad dressings, butter, nuts, and avocado.  Keep a food diary to identify foods that cause symptoms.  Avoid foods that cause reflux. These may be different for different people.  Eat frequent small meals instead of three large meals each day.  Eat your meals slowly, in a relaxed setting.  Limit fried foods.  Cook foods using methods other than frying.  Avoid drinking alcohol.  Avoid drinking large amounts of liquids with your meals.  Avoid bending over or lying down until 2-3 hours after eating. WHAT FOODS ARE NOT RECOMMENDED? The following are some foods and drinks that may worsen your symptoms: Vegetables Tomatoes. Tomato juice. Tomato and spaghetti sauce. Chili peppers. Onion and garlic. Horseradish. Fruits Oranges, grapefruit, and lemon (fruit and juice). Meats High-fat meats, fish, and poultry. This includes hot dogs, ribs, ham, sausage, salami, and bacon. Dairy Whole milk and chocolate milk. Sour cream. Cream. Butter. Ice cream. Cream cheese.  Beverages Coffee and tea, with or without caffeine. Carbonated beverages or energy drinks. Condiments Hot sauce. Barbecue sauce.  Sweets/Desserts Chocolate and cocoa. Donuts. Peppermint and spearmint. Fats and Oils High-fat foods, including [place] fries and potato chips. Other Vinegar. Strong spices, such as black pepper, white pepper, red pepper, cayenne, curry powder, cloves, ginger, and chili powder.  Silent reflux: Not all heartburn burns...Marland KitchenMarland KitchenMarland Kitchen  What is LPR? Laryngopharyngeal reflux (LPR) or silent reflux is a condition in which acid that  is made in the stomach travels up the esophagus (swallowing tube) and gets to the throat. Not everyone with reflux has a lot of heartburn or indigestion. In fact, many people with LPR never have heartburn. This is why LPR is called SILENT REFLUX, and the terms "Silent reflux" and "LPR" are often used interchangeably. Because LPR is silent, it is sometimes difficult to  diagnose.  How can you tell if you have LPR?  Marland Kitchen Chronic hoarseness- Some people have hoarseness that comes and goes . throat clearing  . Cough . It can cause shortness of breath and cause asthma like symptoms. Marland Kitchen a feeling of a lump in the throat  . difficulty swallowing . a problem with too much nose and throat drainage.  . Some people will feel their esophagus spasm which feels like their heart beating hard and fast, this will usually be after a meal, at rest, or lying down at night.    How do I treat this? Treatment for LPR should be individualized, and your doctor will suggest the best treatment for you. Generally there are several treatments for LPR: . changing habits and diet to reduce reflux,  . medications to reduce stomach acid, and  . surgery to prevent reflux. Most people with LPR need to modify how and when they eat, as well as take some medication, to get well. Sometimes, nonprescription liquid antacids, such as Maalox, Gelucil and Mylanta are recommended. When used, these antacids should be taken four times each day - one tablespoon one hour after each meal and before bedtime. Dietary and lifestyle changes alone are not often enough to control LPR - medications that reduce stomach acid are also usually needed. These must be prescribed by our doctor.   TIPS FOR REDUCING REFLUX AND LPR Control your LIFE-STYLE and your DIET! Marland Kitchen If you use tobacco, QUIT.  Marland Kitchen Smoking makes you reflux. After every cigarette you have some LPR.  . Don't wear clothing that is too tight, especially around the waist (trousers, corsets, belts).  . Do not lie down just after eating...in fact, do not eat within three hours of bedtime.  . You should be on a low-fat diet.  . Limit your intake of red meat.  . Limit your intake of butter.  Marland Kitchen Avoid fried foods.  . Avoid chocolate  . Avoid cheese.  Marland Kitchen Avoid eggs. Marland Kitchen Specifically avoid caffeine (especially coffee and tea), soda pop (especially cola) and  mints.  . Avoid alcoholic beverages, particularly in the evening.

## 2019-05-22 LAB — CBC WITH DIFFERENTIAL/PLATELET
Absolute Monocytes: 486 cells/uL (ref 200–950)
Basophils Absolute: 73 cells/uL (ref 0–200)
Basophils Relative: 0.9 %
Eosinophils Absolute: 227 cells/uL (ref 15–500)
Eosinophils Relative: 2.8 %
HCT: 35.8 % (ref 35.0–45.0)
Hemoglobin: 11.8 g/dL (ref 11.7–15.5)
Lymphs Abs: 3240 cells/uL (ref 850–3900)
MCH: 30.7 pg (ref 27.0–33.0)
MCHC: 33 g/dL (ref 32.0–36.0)
MCV: 93.2 fL (ref 80.0–100.0)
MPV: 10.9 fL (ref 7.5–12.5)
Monocytes Relative: 6 %
Neutro Abs: 4074 cells/uL (ref 1500–7800)
Neutrophils Relative %: 50.3 %
Platelets: 324 10*3/uL (ref 140–400)
RBC: 3.84 10*6/uL (ref 3.80–5.10)
RDW: 13 % (ref 11.0–15.0)
Total Lymphocyte: 40 %
WBC: 8.1 10*3/uL (ref 3.8–10.8)

## 2019-05-22 LAB — COMPLETE METABOLIC PANEL WITH GFR
AG Ratio: 1.8 (calc) (ref 1.0–2.5)
ALT: 24 U/L (ref 6–29)
AST: 15 U/L (ref 10–35)
Albumin: 4.1 g/dL (ref 3.6–5.1)
Alkaline phosphatase (APISO): 96 U/L (ref 37–153)
BUN: 17 mg/dL (ref 7–25)
CO2: 29 mmol/L (ref 20–32)
Calcium: 9.6 mg/dL (ref 8.6–10.4)
Chloride: 104 mmol/L (ref 98–110)
Creat: 0.76 mg/dL (ref 0.50–0.99)
GFR, Est African American: 95 mL/min/{1.73_m2} (ref >=60)
GFR, Est Non African American: 82 mL/min/{1.73_m2} (ref >=60)
Globulin: 2.3 g/dL (calc) (ref 1.9–3.7)
Glucose, Bld: 104 mg/dL — ABNORMAL HIGH (ref 65–99)
Potassium: 4.8 mmol/L (ref 3.5–5.3)
Sodium: 141 mmol/L (ref 135–146)
Total Bilirubin: 0.3 mg/dL (ref 0.2–1.2)
Total Protein: 6.4 g/dL (ref 6.1–8.1)

## 2019-05-22 LAB — MAGNESIUM: Magnesium: 2.3 mg/dL (ref 1.5–2.5)

## 2019-05-28 ENCOUNTER — Telehealth: Payer: Self-pay | Admitting: Physician Assistant

## 2019-05-28 DIAGNOSIS — F909 Attention-deficit hyperactivity disorder, unspecified type: Secondary | ICD-10-CM

## 2019-05-28 MED ORDER — METHYLPHENIDATE HCL 20 MG PO TABS: ORAL_TABLET | ORAL | 0 refills | Status: DC

## 2019-05-28 NOTE — Telephone Encounter (Signed)
-----   Message from Elenor Quinones, [place] sent at 05/27/2019  4:21 PM EDT ----- Regarding: REFILL Contact: [phone] REFILL on [address]          PHARMACY: [facility]

## 2019-06-12 ENCOUNTER — Other Ambulatory Visit: Payer: Self-pay | Admitting: Physician Assistant

## 2019-06-12 ENCOUNTER — Other Ambulatory Visit: Payer: Self-pay

## 2019-06-12 ENCOUNTER — Other Ambulatory Visit: Payer: Self-pay | Admitting: *Deleted

## 2019-06-12 DIAGNOSIS — S92901D Unspecified fracture of right foot, subsequent encounter for fracture with routine healing: Secondary | ICD-10-CM

## 2019-06-12 DIAGNOSIS — E2839 Other primary ovarian failure: Secondary | ICD-10-CM

## 2019-06-12 MED ORDER — LEVOTHYROXINE SODIUM 50 MCG PO TABS: ORAL_TABLET | ORAL | 0 refills | Status: DC

## 2019-06-16 ENCOUNTER — Other Ambulatory Visit: Payer: Medicare Other

## 2019-06-22 ENCOUNTER — Other Ambulatory Visit: Payer: Self-pay | Admitting: Physician Assistant

## 2019-06-22 DIAGNOSIS — G4700 Insomnia, unspecified: Secondary | ICD-10-CM

## 2019-06-24 ENCOUNTER — Ambulatory Visit: Payer: Self-pay | Admitting: Internal Medicine

## 2019-06-24 ENCOUNTER — Encounter: Payer: Self-pay | Admitting: Internal Medicine

## 2019-06-24 NOTE — Progress Notes (Signed)
        N  O     S  H  O  W                                                                                                                                                                                                                    This very nice 68 y.o. DWF presents for a Screening /Preventative Visit & comprehensive evaluation and management of multiple medical co-morbidities.  Patient has been followed for HTN, HLD, Prediabetes  and Vitamin D Deficiency.      HTN predates since 2008. Patient's BP has been controlled at home and patient denies any cardiac symptoms as chest pain, palpitations, shortness of breath, dizziness or ankle swelling. Today's        Patient's hyperlipidemia is controlled with diet and medications. Patient denies myalgias or other medication SE's. Last lipids were near goal albeit elevated Trig's:  Lab Results  Component Value Date   CHOL 192 04/21/2019   HDL 58 04/21/2019   LDLCALC 100 (H) 04/21/2019   TRIG 220 (H) 04/21/2019   CHOLHDL 3.3 04/21/2019      Patient has hx/o prediabetes (A1c 5.7%  / 2012)  and patient denies reactive hypoglycemic symptoms, visual blurring, diabetic polys or paresthesias. Last A1c was Normal  & at goal:  Lab Results  Component Value Date   HGBA1C 5.6 04/21/2019      Finally, patient has history of Vitamin D Deficiency ("36" / 2008)  and last Vitamin D was near goal (70-100):

## 2019-07-01 ENCOUNTER — Encounter: Payer: Self-pay | Admitting: Internal Medicine

## 2019-07-01 DIAGNOSIS — J449 Chronic obstructive pulmonary disease, unspecified: Secondary | ICD-10-CM | POA: Insufficient documentation

## 2019-07-01 NOTE — Patient Instructions (Signed)

## 2019-07-01 NOTE — Progress Notes (Signed)
Annual Screening/Preventative Visit & Comprehensive Evaluation &  Examination     This very nice 68 y.o. DWF presents for a Screening /Preventative Visit & comprehensive evaluation and management of multiple medical co-morbidities.  Patient has been followed for HTN, HLD, Prediabetes  and Vitamin D Deficiency.    HTN predates since 2008. Patient's BP has been controlled at home and patient denies any cardiac symptoms as chest pain, palpitations, shortness of breath, dizziness or ankle swelling.  Today's BP is at goal -124/80.         Patient's hyperlipidemia is controlled with diet and medications. Patient denies myalgias or other medication SE's. Last lipids were near goal albeit elevated Trig's:  Lab Results  Component Value Date   CHOL 192 04/21/2019   HDL 58 04/21/2019   LDLCALC 100 (H) 04/21/2019   TRIG 220 (H) 04/21/2019   CHOLHDL 3.3 04/21/2019      Patient has hx/o prediabetes (A1c 5.7%  / 2012) and patient denies reactive hypoglycemic symptoms, visual blurring, diabetic polys or paresthesias. Last A1c was Normal  & at goal:  Lab Results  Component Value Date   HGBA1C 5.6 04/21/2019      Finally, patient has history of Vitamin D Deficiency("36" / 2008)and last Vitamin D was near goal (70-100):   Lab Results  Component Value Date   VD25OH 54 04/21/2019   Current Outpatient Medications on File Prior to Visit  Medication Sig  . albuterol (PROAIR HFA) 108 (90 Base) MCG/ACT inhaler inhale 2 puffs by mouth every 6 hours if needed for wheezing or shortness of breath  . ALPRAZolam (XANAX) 1 MG tablet take 1/2 to 1 tablets by daily for anxiety  . amitriptyline (ELAVIL) 50 MG tablet Take 1 tablet at Bedtime for Sleep  . atorvastatin (LIPITOR) 80 MG tablet TAKE 1/2 TO 1 TABLET BY MOUTH EVERY DAY AS NEEDED FOR CHOLESTEROL  . bimatoprost (LATISSE) 0.03 % ophthalmic solution 1 drop on applicator, apply evenly along the skin of the upper eyelid at base of eyelashes at bedtime;  repeat procedure for second eye  . bumetanide (BUMEX) 2 MG tablet Take 1 tablet 2 x /day fr BP & Fluid  . buPROPion (WELLBUTRIN XL) 300 MG 24 hr tablet take 1 tablet by mouth once daily (Patient taking differently: Take 150 mg by mouth 3 (three) times daily. )  . Cholecalciferol (VITAMIN D PO) Take 5,000 Units by mouth daily.  Marland Kitchen esomeprazole (NEXIUM) 20 MG capsule Take 1 capsule (20 mg total) by mouth daily.  . famotidine (PEPCID) 40 MG tablet Take 1 tablet (40 mg total) by mouth at bedtime.  . Flaxseed, Linseed, (FLAX SEED OIL) 1300 MG CAPS Take 1,300 mg by mouth 3 (three) times daily.  . fluticasone furoate-vilanterol (BREO ELLIPTA) 200-25 MCG/INH AEPB Inhale 1 puff into the lungs daily.  Marland Kitchen levothyroxine (SYNTHROID) 50 MCG tablet Take 1 to 1&1/2 tablet daily & Take on an empty stomach with only water for 30 minutes & no Antacid meds, Calcium or Magnesium for 4 hours & avoid Biotin  . methylphenidate (RITALIN) 20 MG tablet Take 1/2-1 tablet 2  x /day as directed for depression.  Do not take more than 5 days per week . Should last longer than 30 days.  . Multiple Vitamin (MULTIVITAMIN) tablet Take 1 tablet by mouth daily.  . Omega-3 Fatty Acids (FISH OIL) 1000 MG CAPS Take 1 capsule (1,000 mg total) by mouth 3 (three) times daily.  . potassium chloride SA (K-DUR,KLOR-CON) 20 MEQ tablet TAKE 1 TABLET  BY MOUTH THREE TIMES DAILY  . valACYclovir (VALTREX) 500 MG tablet Take two tablets by mouth twice a day at onset of symptoms.  . Venlafaxine HCl (EFFEXOR PO) Take 75 mg by mouth daily.    No current facility-administered medications on file prior to visit.    Allergies  Allergen Reactions  . Codeine Other (See Comments)    Abdominal pain  . Erythromycin Base     GI upset  . Penicillins Rash   Past Medical History:  Diagnosis Date  . Allergy   . Anxiety   . Arthritis   . Asthma    as a child, out grew   . Blood transfusion without reported diagnosis   . Cancer ([place])    basal cell left  temple, left side of nose  . Depression   . GERD (gastroesophageal reflux disease)   . Hyperlipidemia   . Hypertension   . Thyroid disease    hypo  . Tubular adenoma of colon    Health Maintenance  Topic Date Due  . Hepatitis C Screening  December 06, 1950  . MAMMOGRAM  03/31/2012  . COLONOSCOPY  04/13/2017  . DEXA SCAN  12/11/2017  . INFLUENZA VACCINE  04/26/2019  . PNA vac Low Risk Adult (2 of 2 - PPSV23) 12/03/2019  . TETANUS/TDAP  11/30/2025   Immunization History  Administered Date(s) Administered  . Influenza Split 06/22/2015  . Influenza, High Dose Seasonal PF 07/16/2018  . Influenza,inj,quad, With Preservative 07/05/2016  . Influenza-Unspecified 07/22/2017  . Pneumococcal Conjugate-13 12/03/2018  . Pneumococcal-Unspecified 09/25/2004  . Td 09/25/2004  . Tdap 12/01/2015   Last Colon - 04/13/2014 - Dr Hilarie Fredrickson - recc 3 yr f/u due July 2018 - Patient aware overdue.  Last MGM - 03/31/2010 - s/p Bilat Mastectomy for Fibrocystic Breast Dz in 1972  Past Surgical History:  Procedure Laterality Date  . APPENDECTOMY    . bil breast lumpectomy    . bilateral breast masectomy  no breast cancer   bil breast removed to prevent ca.  Marland Kitchen BREAST RECONSTRUCTION  2008  . BREAST SURGERY Bilateral 1983   implants  . CHOLECYSTECTOMY  1975  . EUS N/A 04/23/2014   Procedure: LOWER ENDOSCOPIC ULTRASOUND (EUS);  Surgeon: Milus Banister, MD;  Location: [facility];  Service: Endoscopy;  Laterality: N/A;  . LIPOSUCTION  1998   with tummy tuck  . NASAL SINUS SURGERY  1996   Family History  Problem Relation Age of Onset  . COPD Mother   . Lung cancer Mother   . Emphysema Mother   . Depression Mother   . Dementia Mother   . Early death Father        murdered at 50  . Alcohol abuse Father   . Early death Sister        suicide at 58  . Anxiety disorder Sister   . Depression Sister   . Schizophrenia Sister   . Diabetes Sister   . Alcohol abuse Paternal Uncle   . ADD / ADHD Son   .  Colon cancer Neg Hx   . Esophageal cancer Neg Hx   . Pancreatic cancer Neg Hx   . Rectal cancer Neg Hx   . Stomach cancer Neg Hx    Social History   Tobacco Use  . Smoking status: Current Some Day Smoker    Packs/day: 0.50    Types: E-cigarettes  . Smokeless tobacco: Never Used  . Tobacco comment: Uses E-Cigs and trying to quit completely   Substance  Use Topics  . Alcohol use: Yes    Alcohol/week: 4.0 - 5.0 standard drinks    Types: 4 - 5 Shots of liquor per week    Comment: occasional  . Drug use: No    ROS Constitutional: Denies fever, chills, weight loss/gain, headaches, insomnia,  night sweats, and change in appetite. Does c/o fatigue. Eyes: Denies redness, blurred vision, diplopia, discharge, itchy, watery eyes.  ENT: Denies discharge, congestion, post nasal drip, epistaxis, sore throat, earache, hearing loss, dental pain, Tinnitus, Vertigo, Sinus pain, snoring.  Cardio: Denies chest pain, palpitations, irregular heartbeat, syncope, dyspnea, diaphoresis, orthopnea, PND, claudication, edema Respiratory: denies cough, dyspnea, DOE, pleurisy, hoarseness, laryngitis, wheezing.  Gastrointestinal: Denies dysphagia, heartburn, reflux, water brash, pain, cramps, nausea, vomiting, bloating, diarrhea, constipation, hematemesis, melena, hematochezia, jaundice, hemorrhoids Genitourinary: Denies dysuria, frequency, urgency, nocturia, hesitancy, discharge, hematuria, flank pain Breast: Breast lumps, nipple discharge, bleeding.  Musculoskeletal: Denies arthralgia, myalgia, stiffness, Jt. Swelling, pain, limp, and strain/sprain. Denies falls. Skin: Denies puritis, rash, hives, warts, acne, eczema, changing in skin lesion Neuro: No weakness, tremor, incoordination, spasms, paresthesia, pain Psychiatric: Denies confusion, memory loss, sensory loss. Denies Depression. Endocrine: Denies change in weight, skin, hair change, nocturia, and paresthesia, diabetic polys, visual blurring, hyper / hypo  glycemic episodes.  Heme/Lymph: No excessive bleeding, bruising, enlarged lymph nodes.  Physical Exam  BP 124/80   Pulse 92   Temp (!) 96.3 F (35.7 C)   Resp 16   Ht 5\' 4"  (1.626 m)   Wt 156 lb 6.4 oz (70.9 kg)   BMI 26.85 kg/m   General Appearance: Well nourished, well groomed and in no apparent distress.  Eyes: PERRLA, EOMs, conjunctiva no swelling or erythema, normal fundi and vessels. Sinuses: No frontal/maxillary tenderness ENT/Mouth: EACs patent / TMs  nl. Nares clear without erythema, swelling, mucoid exudates. Oral hygiene is good. No erythema, swelling, or exudate. Tongue normal, non-obstructing. Tonsils not swollen or erythematous. Hearing normal.  Neck: Supple, thyroid not palpable. No bruits, nodes or JVD. Respiratory: Respiratory effort normal.  BS equal and clear bilateral without rales, rhonci, wheezing or stridor. Cardio: Heart sounds are normal with regular rate and rhythm and no murmurs, rubs or gallops. Peripheral pulses are normal and equal bilaterally without edema. No aortic or femoral bruits. Chest: symmetric with normal excursions and percussion. Breasts: Symmetric, without lumps, nipple discharge, retractions, or fibrocystic changes.  Abdomen: Flat, soft with bowel sounds active. Nontender, no guarding, rebound, hernias, masses, or organomegaly.  Lymphatics: Non tender without lymphadenopathy.  Musculoskeletal: Full ROM all peripheral extremities, joint stability, 5/5 strength, and normal gait. Skin: Warm and dry without rashes, lesions, cyanosis, clubbing or  ecchymosis.  Neuro: Cranial nerves intact, reflexes equal bilaterally. Normal muscle tone, no cerebellar symptoms. Sensation intact.  Pysch: Alert and oriented X 3, normal affect, Insight and Judgment appropriate.   Assessment and Plan  1. Essential hypertension  - EKG 12-Lead - Urinalysis, Routine w reflex microscopic - Microalbumin / creatinine urine ratio - CBC with Differential/Platelet -  COMPLETE METABOLIC PANEL WITH GFR - Magnesium - TSH  2. Hyperlipidemia, mixed  - EKG 12-Lead - Lipid panel - TSH  3. Abnormal glucose  - Hemoglobin A1c - Insulin, random  4. Vitamin D deficiency  - VITAMIN D 25 Hydroxyl  5. Prediabetes  - EKG 12-Lead - Hemoglobin A1c - Insulin, random  6. Hypothyroidism  - TSH  7. COPD(HCC)  8. B12 deficiency  - Vitamin B12  9. Attention deficit hyperactivity disorder (ADHD  10. Severe recurrent major depression  without psychotic features ([place])  11. Gastroesophageal reflux disease   - CBC with Differential/Platelet  12. Screening for colorectal cancer  - POC Hemoccult Bld/Stl  13. Screening for ischemic heart disease  - EKG 12-Lead  14. FHx: heart disease  - EKG 12-Lead  15. Smoker  - EKG 12-Lead  16. Medication management  - Urinalysis, Routine w reflex microscopic - Microalbumin / creatinine urine ratio - COMPLETE METABOLIC PANEL WITH GFR - Magnesium - Lipid panel - TSH - Hemoglobin A1c - Insulin, random - VITAMIN D 25 Hydroxyl - Vitamin B12       Patient was counseled in prudent diet to achieve/maintain BMI less than 25 for weight control, BP monitoring, regular exercise and medications. Discussed med's effects and SE's. Screening labs and tests as requested with regular follow-up as recommended. Over 40 minutes of exam, counseling, chart review and high complex critical decision making was performed.   Kirtland Bouchard, MD

## 2019-07-02 ENCOUNTER — Other Ambulatory Visit: Payer: Self-pay

## 2019-07-02 ENCOUNTER — Ambulatory Visit (INDEPENDENT_AMBULATORY_CARE_PROVIDER_SITE_OTHER): Payer: Medicare Other | Admitting: Internal Medicine

## 2019-07-02 VITALS — BP 124/80 | HR 92 | Temp 96.3°F | Resp 16 | Ht 64.0 in | Wt 156.4 lb

## 2019-07-02 DIAGNOSIS — E538 Deficiency of other specified B group vitamins: Secondary | ICD-10-CM | POA: Diagnosis not present

## 2019-07-02 DIAGNOSIS — E559 Vitamin D deficiency, unspecified: Secondary | ICD-10-CM | POA: Diagnosis not present

## 2019-07-02 DIAGNOSIS — R7309 Other abnormal glucose: Secondary | ICD-10-CM | POA: Diagnosis not present

## 2019-07-02 DIAGNOSIS — Z136 Encounter for screening for cardiovascular disorders: Secondary | ICD-10-CM

## 2019-07-02 DIAGNOSIS — Z23 Encounter for immunization: Secondary | ICD-10-CM

## 2019-07-02 DIAGNOSIS — Z79899 Other long term (current) drug therapy: Secondary | ICD-10-CM

## 2019-07-02 DIAGNOSIS — K2100 Gastro-esophageal reflux disease with esophagitis, without bleeding: Secondary | ICD-10-CM | POA: Diagnosis not present

## 2019-07-02 DIAGNOSIS — J449 Chronic obstructive pulmonary disease, unspecified: Secondary | ICD-10-CM

## 2019-07-02 DIAGNOSIS — I10 Essential (primary) hypertension: Secondary | ICD-10-CM | POA: Diagnosis not present

## 2019-07-02 DIAGNOSIS — F909 Attention-deficit hyperactivity disorder, unspecified type: Secondary | ICD-10-CM

## 2019-07-02 DIAGNOSIS — F17200 Nicotine dependence, unspecified, uncomplicated: Secondary | ICD-10-CM

## 2019-07-02 DIAGNOSIS — Z8249 Family history of ischemic heart disease and other diseases of the circulatory system: Secondary | ICD-10-CM | POA: Diagnosis not present

## 2019-07-02 DIAGNOSIS — F332 Major depressive disorder, recurrent severe without psychotic features: Secondary | ICD-10-CM

## 2019-07-02 DIAGNOSIS — E782 Mixed hyperlipidemia: Secondary | ICD-10-CM | POA: Diagnosis not present

## 2019-07-02 DIAGNOSIS — R7303 Prediabetes: Secondary | ICD-10-CM | POA: Diagnosis not present

## 2019-07-02 DIAGNOSIS — E079 Disorder of thyroid, unspecified: Secondary | ICD-10-CM

## 2019-07-02 DIAGNOSIS — Z1211 Encounter for screening for malignant neoplasm of colon: Secondary | ICD-10-CM

## 2019-07-02 MED ORDER — SUCRALFATE 1 G PO TABS: ORAL_TABLET | ORAL | 3 refills | Status: DC

## 2019-07-03 LAB — CBC WITH DIFFERENTIAL/PLATELET
Absolute Monocytes: 576 cells/uL (ref 200–950)
Basophils Absolute: 79 cells/uL (ref 0–200)
Basophils Relative: 1.1 %
Eosinophils Absolute: 367 cells/uL (ref 15–500)
Eosinophils Relative: 5.1 %
HCT: 35.8 % (ref 35.0–45.0)
Hemoglobin: 12.1 g/dL (ref 11.7–15.5)
Lymphs Abs: 3053 cells/uL (ref 850–3900)
MCH: 30.8 pg (ref 27.0–33.0)
MCHC: 33.8 g/dL (ref 32.0–36.0)
MCV: 91.1 fL (ref 80.0–100.0)
MPV: 10.3 fL (ref 7.5–12.5)
Monocytes Relative: 8 %
Neutro Abs: 3125 cells/uL (ref 1500–7800)
Neutrophils Relative %: 43.4 %
Platelets: 316 10*3/uL (ref 140–400)
RBC: 3.93 10*6/uL (ref 3.80–5.10)
RDW: 12.8 % (ref 11.0–15.0)
Total Lymphocyte: 42.4 %
WBC: 7.2 10*3/uL (ref 3.8–10.8)

## 2019-07-03 LAB — URINALYSIS, ROUTINE W REFLEX MICROSCOPIC
Bacteria, UA: NONE SEEN /HPF
Bilirubin Urine: NEGATIVE
Glucose, UA: NEGATIVE
Hgb urine dipstick: NEGATIVE
Hyaline Cast: NONE SEEN /LPF
Ketones, ur: NEGATIVE
Nitrite: NEGATIVE
RBC / HPF: NONE SEEN /HPF (ref 0–2)
Specific Gravity, Urine: 1.022 (ref 1.001–1.03)
WBC, UA: NONE SEEN /HPF (ref 0–5)
pH: >=8.5 — AB (ref 5.0–8.0)

## 2019-07-03 LAB — COMPLETE METABOLIC PANEL WITH GFR
AG Ratio: 1.8 (calc) (ref 1.0–2.5)
ALT: 66 U/L — ABNORMAL HIGH (ref 6–29)
AST: 36 U/L — ABNORMAL HIGH (ref 10–35)
Albumin: 4.2 g/dL (ref 3.6–5.1)
Alkaline phosphatase (APISO): 91 U/L (ref 37–153)
BUN: 22 mg/dL (ref 7–25)
CO2: 30 mmol/L (ref 20–32)
Calcium: 9.3 mg/dL (ref 8.6–10.4)
Chloride: 99 mmol/L (ref 98–110)
Creat: 0.72 mg/dL (ref 0.50–0.99)
GFR, Est African American: 101 mL/min/{1.73_m2} (ref >=60)
GFR, Est Non African American: 87 mL/min/{1.73_m2} (ref >=60)
Globulin: 2.4 g/dL (calc) (ref 1.9–3.7)
Glucose, Bld: 91 mg/dL (ref 65–99)
Potassium: 4.3 mmol/L (ref 3.5–5.3)
Sodium: 138 mmol/L (ref 135–146)
Total Bilirubin: 0.3 mg/dL (ref 0.2–1.2)
Total Protein: 6.6 g/dL (ref 6.1–8.1)

## 2019-07-03 LAB — VITAMIN D 25 HYDROXY (VIT D DEFICIENCY, FRACTURES): Vit D, 25-Hydroxy: 21 ng/mL — ABNORMAL LOW (ref 30–100)

## 2019-07-03 LAB — HEMOGLOBIN A1C
Hgb A1c MFr Bld: 5.5 % of total Hgb (ref <5.7)
Mean Plasma Glucose: 111 (calc)
eAG (mmol/L): 6.2 (calc)

## 2019-07-03 LAB — LIPID PANEL
Cholesterol: 390 mg/dL — ABNORMAL HIGH (ref <200)
HDL: 60 mg/dL (ref >=50)
Non-HDL Cholesterol (Calc): 330 mg/dL (calc) — ABNORMAL HIGH (ref <130)
Total CHOL/HDL Ratio: 6.5 (calc) — ABNORMAL HIGH (ref <5.0)
Triglycerides: 480 mg/dL — ABNORMAL HIGH (ref <150)

## 2019-07-03 LAB — TSH: TSH: 2.34 mIU/L (ref 0.40–4.50)

## 2019-07-03 LAB — MICROALBUMIN / CREATININE URINE RATIO
Creatinine, Urine: 103 mg/dL (ref 20–275)
Microalb Creat Ratio: 4 mcg/mg creat (ref <30)
Microalb, Ur: 0.4 mg/dL

## 2019-07-03 LAB — MAGNESIUM: Magnesium: 2.5 mg/dL (ref 1.5–2.5)

## 2019-07-03 LAB — INSULIN, RANDOM: Insulin: 6.4 u[IU]/mL

## 2019-07-03 LAB — VITAMIN B12: Vitamin B-12: 641 pg/mL (ref 200–1100)

## 2019-07-10 ENCOUNTER — Other Ambulatory Visit: Payer: Self-pay | Admitting: Internal Medicine

## 2019-07-10 DIAGNOSIS — F909 Attention-deficit hyperactivity disorder, unspecified type: Secondary | ICD-10-CM

## 2019-07-10 MED ORDER — METHYLPHENIDATE HCL 20 MG PO TABS: ORAL_TABLET | ORAL | 0 refills | Status: DC

## 2019-07-24 ENCOUNTER — Encounter: Payer: Self-pay | Admitting: Adult Health

## 2019-07-24 ENCOUNTER — Other Ambulatory Visit: Payer: Self-pay

## 2019-07-24 ENCOUNTER — Ambulatory Visit: Payer: Medicare Other | Admitting: Adult Health

## 2019-07-24 VITALS — Temp 97.2°F

## 2019-07-24 DIAGNOSIS — J209 Acute bronchitis, unspecified: Secondary | ICD-10-CM | POA: Diagnosis not present

## 2019-07-24 DIAGNOSIS — J4521 Mild intermittent asthma with (acute) exacerbation: Secondary | ICD-10-CM

## 2019-07-24 DIAGNOSIS — J440 Chronic obstructive pulmonary disease with acute lower respiratory infection: Secondary | ICD-10-CM

## 2019-07-24 MED ORDER — BREO ELLIPTA 200-25 MCG/INH IN AEPB: 1.0000 | INHALATION_SPRAY | Freq: Every day | RESPIRATORY_TRACT | 11 refills | Status: DC

## 2019-07-24 MED ORDER — ALBUTEROL SULFATE HFA 108 (90 BASE) MCG/ACT IN AERS: INHALATION_SPRAY | RESPIRATORY_TRACT | 2 refills | Status: DC

## 2019-07-24 MED ORDER — DOXYCYCLINE HYCLATE 100 MG PO CAPS: ORAL_CAPSULE | ORAL | 0 refills | Status: DC

## 2019-07-24 MED ORDER — PREDNISONE 20 MG PO TABS: ORAL_TABLET | ORAL | 0 refills | Status: DC

## 2019-07-24 MED ORDER — PROMETHAZINE-DM 6.25-15 MG/5ML PO SYRP: 5.0000 mL | ORAL_SOLUTION | Freq: Four times a day (QID) | ORAL | 1 refills | Status: DC | PRN

## 2019-07-24 NOTE — Progress Notes (Signed)
Virtual Visit via Telephone Note  I connected with Margaret Nelson on 07/24/19 at  2:30 PM EDT by telephone and verified that I am speaking with the correct person using two identifiers.  Location: Patient: home Provider: [place] office    I discussed the limitations, risks, security and privacy concerns of performing an evaluation and management service by telephone and the availability of in person appointments. I also discussed with the patient that there may be a patient responsible charge related to this service. The patient expressed understanding and agreed to proceed.   History of Present Illness:  Temp (!) 97.2 F (36.2 C)   68 y.o. female with hx of extrinsic asthma, COPD, htn, GERD, current some day smoker (e cigs, in process of tapering to try to quit), requests evaluation due to cough.   She reports 3 days of gradual onset of a predominantly dry cough, occasionally productive (thick, white) in the afternoon; she reports frequent coughing fits. She denies fever/chills, dyspnea, wheezing, CP; she denies sore throat, nasal congestion/drainage, sinus pressure, headaches. Denies dizziness or nausea. She denies fatigue, weight gain, LE edema, PND.   She reports this feels typical for annual bronchitis; she reports typically resolves with zpak.   She is out of breo, has been taking albuterol approximately twice daily "when I feel like it."   She has tried OTC robitussin which hasn't helped much with cough.   She reports has not seen anyone in the last week (hasn't left the house) other than to a dentist appointment 2-3 weeks ago. She has seen family but none symptomatic and all practicing aggressive social distancing due to sister with suppressed immune system.    Current Outpatient Medications on File Prior to Visit  Medication Sig Dispense Refill  . ALPRAZolam (XANAX) 1 MG tablet take 1/2 to 1 tablets by daily for anxiety 120 tablet 0  . amitriptyline (ELAVIL) 50 MG tablet Take  1 tablet at Bedtime for Sleep 90 tablet 1  . atorvastatin (LIPITOR) 80 MG tablet TAKE 1/2 TO 1 TABLET BY MOUTH EVERY DAY AS NEEDED FOR CHOLESTEROL 90 tablet 0  . bimatoprost (LATISSE) 0.03 % ophthalmic solution 1 drop on applicator, apply evenly along the skin of the upper eyelid at base of eyelashes at bedtime; repeat procedure for second eye 3 mL 12  . bumetanide (BUMEX) 2 MG tablet Take 1 tablet 2 x /day fr BP & Fluid 180 tablet 1  . buPROPion (WELLBUTRIN XL) 300 MG 24 hr tablet take 1 tablet by mouth once daily (Patient taking differently: Take 150 mg by mouth 3 (three) times daily. ) 90 tablet 1  . Cholecalciferol (VITAMIN D PO) Take 5,000 Units by mouth daily.    Marland Kitchen esomeprazole (NEXIUM) 20 MG capsule Take 1 capsule (20 mg total) by mouth daily. 90 capsule 1  . famotidine (PEPCID) 40 MG tablet Take 1 tablet (40 mg total) by mouth at bedtime. 90 tablet 1  . Flaxseed, Linseed, (FLAX SEED OIL) 1300 MG CAPS Take 1,300 mg by mouth 3 (three) times daily.    Marland Kitchen levothyroxine (SYNTHROID) 50 MCG tablet Take 1 to 1&1/2 tablet daily & Take on an empty stomach with only water for 30 minutes & no Antacid meds, Calcium or Magnesium for 4 hours & avoid Biotin 135 tablet 0  . methylphenidate (RITALIN) 20 MG tablet Take 1/2-1 tablet 2  x /day as directed for depression.  Do not take more than 5 days per week . Should last longer than 30 days.  60 tablet 0  . Multiple Vitamin (MULTIVITAMIN) tablet Take 1 tablet by mouth daily.    . Omega-3 Fatty Acids (FISH OIL) 1000 MG CAPS Take 1 capsule (1,000 mg total) by mouth 3 (three) times daily.  0  . potassium chloride SA (K-DUR,KLOR-CON) 20 MEQ tablet TAKE 1 TABLET BY MOUTH THREE TIMES DAILY 270 tablet 1  . sucralfate (CARAFATE) 1 g tablet Take 1 tablet dissolved in water 4 x /day before Meals & Bedtime for Heartburn 360 tablet 3  . valACYclovir (VALTREX) 500 MG tablet Take two tablets by mouth twice a day at onset of symptoms. 30 tablet 3  . Venlafaxine HCl (EFFEXOR PO)  Take 75 mg by mouth daily.      No current facility-administered medications on file prior to visit.     Allergies:  Allergies  Allergen Reactions  . Codeine Other (See Comments)    Abdominal pain  . Erythromycin Base     GI upset  . Penicillins Rash   Medical History:  has Essential hypertension; Hyperlipidemia, mixed; Depression, major, recurrent, in partial remission ([place]); Vitamin D deficiency; Hypothyroidism; Medication management; Gastroesophageal reflux disease; Smoker; Extrinsic asthma; Body mass index (BMI) of 23.0-23.9 in adult; Severe recurrent major depression without psychotic features ([place]); Abnormal glucose; Screening for ischemic heart disease; Attention deficit hyperactivity disorder (ADHD); and COPD (chronic obstructive pulmonary disease) ([place]) on their problem list. Surgical History:  She  has a past surgical history that includes Cholecystectomy (1975); Nasal sinus surgery (1996); Liposuction (1998); Breast surgery (Bilateral, 1983); Breast reconstruction (2008); Appendectomy; bil breast lumpectomy; bilateral breast masectomy (no breast cancer); and EUS (N/A, 04/23/2014). Family History:  Herfamily history includes ADD / ADHD in her son; Alcohol abuse in her father and paternal uncle; Anxiety disorder in her sister; COPD in her mother; Dementia in her mother; Depression in her mother and sister; Diabetes in her sister; Early death in her father and sister; Emphysema in her mother; Lung cancer in her mother; Schizophrenia in her sister. Social History:   reports that she has been smoking e-cigarettes. She has been smoking about 0.50 packs per day. She has never used smokeless tobacco. She reports current alcohol use of about 4.0 - 5.0 standard drinks of alcohol per week. She reports that she does not use drugs.     Observations/Objective:  General : Well sounding patient in no apparent distress HEENT: no hoarseness, occasional dry cough heard during visit Lungs: speaks  in complete sentences, no audible wheezing, no apparent distress Neurological: alert, oriented x 3 Psychiatric: pleasant, judgement appropriate    Assessment and Plan:  Komal was seen today for cough.  Diagnoses and all orders for this visit:  COPD with acute bronchitis ([place]) Discussed the importance of avoiding unnecessary antibiotic therapy; typically viral etiology Suggested symptomatic OTC remedies. Advised to stop smoking Follow up on Monday if any worse Initiate antibiotic only if progressive symptoms, developing fever/chills, increasingly productive cough despite inhalers Discussed drive through options for covid 19 testing, though minimal exposure potential and she declines for now; advised she remain in quarantine in the interim The patient was advised to call immediately if she has any concerning symptoms in the interval. The patient voices understanding of current treatment options and is in agreement with the current care plan.The patient knows to call the clinic with any problems, questions or concerns or go to the ER if any further progression of symptoms.  -     fluticasone furoate-vilanterol (BREO ELLIPTA) 200-25 MCG/INH AEPB; Inhale 1 puff  into the lungs daily. -     albuterol (PROAIR HFA) 108 (90 Base) MCG/ACT inhaler; inhale 2 puffs by mouth every 6 hours if needed for wheezing or shortness of breath -     predniSONE (DELTASONE) 20 MG tablet; 2 tablets daily for 3 days, 1 tablet daily for 4 days. -     promethazine-dextromethorphan (PROMETHAZINE-DM) 6.25-15 MG/5ML syrup; Take 5 mLs by mouth 4 (four) times daily as needed for cough.  Other orders -     doxycycline (VIBRAMYCIN) 100 MG capsule; Take 1 capsule twice daily with food   Follow Up Instructions:    I discussed the assessment and treatment plan with the patient. The patient was provided an opportunity to ask questions and all were answered. The patient agreed with the plan and demonstrated an understanding of  the instructions.   The patient was advised to call back or seek an in-person evaluation if the symptoms worsen or if the condition fails to improve as anticipated.  I provided 15 minutes of non-face-to-face time during this encounter.   Izora Ribas, NP

## 2019-07-25 DIAGNOSIS — F330 Major depressive disorder, recurrent, mild: Secondary | ICD-10-CM | POA: Diagnosis not present

## 2019-07-29 ENCOUNTER — Other Ambulatory Visit: Payer: Self-pay | Admitting: Adult Health Nurse Practitioner

## 2019-08-11 ENCOUNTER — Other Ambulatory Visit: Payer: Self-pay | Admitting: Internal Medicine

## 2019-08-11 DIAGNOSIS — R609 Edema, unspecified: Secondary | ICD-10-CM

## 2019-08-11 DIAGNOSIS — I10 Essential (primary) hypertension: Secondary | ICD-10-CM

## 2019-08-13 NOTE — Progress Notes (Deleted)
Assessment and Plan:    HPI 68 y.o.female presents for discussion on insulin.  Her insulin test have never been elevated but she was in the PreDM range in 2015, 2016 and briefly in 2017 but has been controlled with diet since that time.  Her trigs have been elevated and last time her lipitor was increase.  Lab Results  Component Value Date   CHOL 390 (H) 07/02/2019   HDL 60 07/02/2019   [place]  07/02/2019     Comment:     . LDL cholesterol not calculated. Triglyceride levels greater than 400 mg/dL invalidate calculated LDL results. . Reference range: <100 . Desirable range <100 mg/dL for primary prevention;   <70 mg/dL for patients with CHD or diabetic patients  with > or = 2 CHD risk factors. Marland Kitchen LDL-C is now calculated using the Martin-Hopkins  calculation, which is a validated novel method providing  better accuracy than the Friedewald equation in the  estimation of LDL-C.  Cresenciano Genre et al. Annamaria Helling. [id number]): 2061-2068  (http://education.[facility].com/faq/FAQ164)    TRIG 480 (H) 07/02/2019   CHOLHDL 6.5 (H) 07/02/2019    Lab Results  Component Value Date   HGBA1C 5.5 07/02/2019     Patient Active Problem List   Diagnosis Date Noted  . COPD (chronic obstructive pulmonary disease) ([place]) 07/01/2019  . Abnormal glucose 05/21/2018  . Screening for ischemic heart disease 05/21/2018  . Attention deficit hyperactivity disorder (ADHD) 05/21/2018  . Severe recurrent major depression without psychotic features ([place]) 09/17/2017    Class: Chronic  . Extrinsic asthma 07/29/2015  . Body mass index (BMI) of 23.0-23.9 in adult 07/29/2015  . Smoker 04/14/2015  . Gastroesophageal reflux disease 02/15/2015  . Medication management 02/12/2014  . Hypothyroidism   . Essential hypertension 08/18/2013  . Hyperlipidemia, mixed 08/18/2013  . Depression, major, recurrent, in partial remission ([place]) 08/18/2013  . Vitamin D deficiency 08/18/2013     Current Outpatient  Medications (Endocrine & Metabolic):  .  levothyroxine (SYNTHROID) 50 MCG tablet, Take 1 to 1&1/2 tablet daily & Take on an empty stomach with only water for 30 minutes & no Antacid meds, Calcium or Magnesium for 4 hours & avoid Biotin .  predniSONE (DELTASONE) 20 MG tablet, 2 tablets daily for 3 days, 1 tablet daily for 4 days.  Current Outpatient Medications (Cardiovascular):  .  atorvastatin (LIPITOR) 80 MG tablet, TAKE 1/2 TO 1 TABLET BY MOUTH EVERY DAY AS NEEDED FOR CHOLESTEROL .  bumetanide (BUMEX) 2 MG tablet, Take 1 tablet 2 x /day for BP & Fluid Retention / Ankle Swelling  Current Outpatient Medications (Respiratory):  .  albuterol (PROAIR HFA) 108 (90 Base) MCG/ACT inhaler, inhale 2 puffs by mouth every 6 hours if needed for wheezing or shortness of breath .  fluticasone furoate-vilanterol (BREO ELLIPTA) 200-25 MCG/INH AEPB, Inhale 1 puff into the lungs daily. .  promethazine-dextromethorphan (PROMETHAZINE-DM) 6.25-15 MG/5ML syrup, Take 5 mLs by mouth 4 (four) times daily as needed for cough.    Current Outpatient Medications (Other):  Marland Kitchen  ALPRAZolam (XANAX) 1 MG tablet, take 1/2 to 1 tablets by daily for anxiety .  amitriptyline (ELAVIL) 50 MG tablet, Take 1 tablet at Bedtime for Sleep .  bimatoprost (LATISSE) 0.03 % ophthalmic solution, 1 drop on applicator, apply evenly along the skin of the upper eyelid at base of eyelashes at bedtime; repeat procedure for second eye .  buPROPion (WELLBUTRIN XL) 300 MG 24 hr tablet, take 1 tablet by mouth once daily (Patient taking differently: Take 150  mg by mouth 3 (three) times daily. ) .  Cholecalciferol (VITAMIN D PO), Take 5,000 Units by mouth daily. Marland Kitchen  doxycycline (VIBRAMYCIN) 100 MG capsule, Take 1 capsule twice daily with food .  esomeprazole (NEXIUM) 20 MG capsule, Take 1 capsule (20 mg total) by mouth daily. .  famotidine (PEPCID) 40 MG tablet, Take 1 tablet (40 mg total) by mouth at bedtime. .  Flaxseed, Linseed, (FLAX SEED OIL) 1300  MG CAPS, Take 1,300 mg by mouth 3 (three) times daily. .  methylphenidate (RITALIN) 20 MG tablet, Take 1/2-1 tablet 2  x /day as directed for depression.  Do not take more than 5 days per week . Should last longer than 30 days. .  Multiple Vitamin (MULTIVITAMIN) tablet, Take 1 tablet by mouth daily. .  Omega-3 Fatty Acids (FISH OIL) 1000 MG CAPS, Take 1 capsule (1,000 mg total) by mouth 3 (three) times daily. .  potassium chloride SA (K-DUR,KLOR-CON) 20 MEQ tablet, TAKE 1 TABLET BY MOUTH THREE TIMES DAILY .  sucralfate (CARAFATE) 1 g tablet, Take 1 tablet dissolved in water 4 x /day before Meals & Bedtime for Heartburn .  valACYclovir (VALTREX) 500 MG tablet, Take two tablets by mouth twice a day at onset of symptoms. .  Venlafaxine HCl (EFFEXOR PO), Take 75 mg by mouth daily.   Allergies  Allergen Reactions  . Codeine Other (See Comments)    Abdominal pain  . Erythromycin Base     GI upset  . Penicillins Rash    ROS: all negative except above.   Physical Exam: There were no vitals filed for this visit. There were no vitals taken for this visit. General Appearance: Well nourished, in no apparent distress. Eyes: PERRLA, EOMs, conjunctiva no swelling or erythema Sinuses: No Frontal/maxillary tenderness ENT/Mouth: Ext aud canals clear, TMs without erythema, bulging. No erythema, swelling, or exudate on post pharynx.  Tonsils not swollen or erythematous. Hearing normal.  Neck: Supple, thyroid normal.  Respiratory: Respiratory effort normal, BS equal bilaterally without rales, rhonchi, wheezing or stridor.  Cardio: RRR with no MRGs. Brisk peripheral pulses without edema.  Abdomen: Soft, + BS.  Non tender, no guarding, rebound, hernias, masses. Lymphatics: Non tender without lymphadenopathy.  Musculoskeletal: Full ROM, 5/5 strength, normal gait.  Skin: Warm, dry without rashes, lesions, ecchymosis.  Neuro: Cranial nerves intact. Normal muscle tone, no cerebellar symptoms. Sensation  intact.  Psych: Awake and oriented X 3, normal affect, Insight and Judgment appropriate.     Vicie Mutters, PA-C 12:13 PM [facility]

## 2019-08-15 ENCOUNTER — Ambulatory Visit: Payer: Medicare Other | Admitting: Physician Assistant

## 2019-08-18 ENCOUNTER — Ambulatory Visit (INDEPENDENT_AMBULATORY_CARE_PROVIDER_SITE_OTHER): Payer: Medicare Other | Admitting: Physician Assistant

## 2019-08-18 ENCOUNTER — Encounter: Payer: Self-pay | Admitting: Physician Assistant

## 2019-08-18 ENCOUNTER — Other Ambulatory Visit: Payer: Self-pay

## 2019-08-18 VITALS — BP 118/66 | HR 99 | Temp 97.6°F | Wt 167.0 lb

## 2019-08-18 DIAGNOSIS — R635 Abnormal weight gain: Secondary | ICD-10-CM | POA: Diagnosis not present

## 2019-08-18 MED ORDER — TOPIRAMATE 50 MG PO TABS: 50.0000 mg | ORAL_TABLET | Freq: Every day | ORAL | 2 refills | Status: DC

## 2019-08-18 NOTE — Progress Notes (Signed)
Assessment and Plan:   Weight gain due to medication Stop the amitriptyline, weight gain started with the initiation of this medication Start topamax Stop liquid sugars like juice and milk Increase water Has follow up in Jan, call if any issues Just had labs, no need to check at this time. -     topiramate (TOPAMAX) 50 MG tablet; Take 1 tablet (50 mg total) by mouth at bedtime.   Future Appointments  Date Time Provider [place]  08/27/2019  4:30 PM [facility] DX DEXA 1 [facility]  10/06/2019  2:30 PM Vicie Mutters, PA-C [facility] None  01/08/2020  2:30 PM Unk Pinto, MD [facility] None  07/28/2020  2:00 PM Unk Pinto, MD [facility] None    HPI 68 y.o.female presents for discussion on insulin resistance, she is very upset about her weight right now. She states she weights more than she did even being pregnant with her two kids. She had her teeth pulled 10 months ago and still does not have dentures due to [place], she goes back Dec 1st.    She can eat chicken or hamburger right now, she is eating a lot of fruit.  She drinks water and grape juice and milk, she tries to drink 64 oz of water a day. She does not drink any alcohol. She eats 1 meal a day and snacks on fruit She mainly has been eating fruit due to her teeth.   She is on amitriptyline has been x August- on it for sleep.   BMI is Body mass index is 28.67 kg/m., she is working on diet and exercise. Wt Readings from Last 3 Encounters:  08/18/19 167 lb (75.8 kg)  07/02/19 156 lb 6.4 oz (70.9 kg)  05/21/19 147 lb 12.8 oz (67 kg)    Her insulin test have never been elevated but she was in the PreDM range in 2015, 2016 and briefly in 2017 but has been controlled with diet since that time.  Her trigs have been elevated and last time her lipitor was increase.  Lab Results  Component Value Date   CHOL 390 (H) 07/02/2019   HDL 60 07/02/2019   [place]  07/02/2019     Comment:     . LDL  cholesterol not calculated. Triglyceride levels greater than 400 mg/dL invalidate calculated LDL results. . Reference range: <100 . Desirable range <100 mg/dL for primary prevention;   <70 mg/dL for patients with CHD or diabetic patients  with > or = 2 CHD risk factors. Marland Kitchen LDL-C is now calculated using the Martin-Hopkins  calculation, which is a validated novel method providing  better accuracy than the Friedewald equation in the  estimation of LDL-C.  Cresenciano Genre et al. Annamaria Helling. [id number]): 2061-2068  (http://education.[facility].com/faq/FAQ164)    TRIG 480 (H) 07/02/2019   CHOLHDL 6.5 (H) 07/02/2019    Lab Results  Component Value Date   HGBA1C 5.5 07/02/2019   Lab Results  Component Value Date   TSH 2.34 07/02/2019     Patient Active Problem List   Diagnosis Date Noted  . COPD (chronic obstructive pulmonary disease) ([place]) 07/01/2019  . Abnormal glucose 05/21/2018  . Screening for ischemic heart disease 05/21/2018  . Attention deficit hyperactivity disorder (ADHD) 05/21/2018  . Severe recurrent major depression without psychotic features ([place]) 09/17/2017    Class: Chronic  . Extrinsic asthma 07/29/2015  . Body mass index (BMI) of 23.0-23.9 in adult 07/29/2015  . Smoker 04/14/2015  . Gastroesophageal reflux disease 02/15/2015  . Medication management 02/12/2014  .  Hypothyroidism   . Essential hypertension 08/18/2013  . Hyperlipidemia, mixed 08/18/2013  . Depression, major, recurrent, in partial remission ([employer]) 08/18/2013  . Vitamin D deficiency 08/18/2013     Current Outpatient Medications (Endocrine & Metabolic):  .  levothyroxine (SYNTHROID) 50 MCG tablet, Take 1 to 1&1/2 tablet daily & Take on an empty stomach with only water for 30 minutes & no Antacid meds, Calcium or Magnesium for 4 hours & avoid Biotin .  predniSONE (DELTASONE) 20 MG tablet, 2 tablets daily for 3 days, 1 tablet daily for 4 days.  Current Outpatient Medications (Cardiovascular):  .   atorvastatin (LIPITOR) 80 MG tablet, TAKE 1/2 TO 1 TABLET BY MOUTH EVERY DAY AS NEEDED FOR CHOLESTEROL .  bumetanide (BUMEX) 2 MG tablet, Take 1 tablet 2 x /day for BP & Fluid Retention / Ankle Swelling  Current Outpatient Medications (Respiratory):  .  albuterol (PROAIR HFA) 108 (90 Base) MCG/ACT inhaler, inhale 2 puffs by mouth every 6 hours if needed for wheezing or shortness of breath .  fluticasone furoate-vilanterol (BREO ELLIPTA) 200-25 MCG/INH AEPB, Inhale 1 puff into the lungs daily. .  promethazine-dextromethorphan (PROMETHAZINE-DM) 6.25-15 MG/5ML syrup, Take 5 mLs by mouth 4 (four) times daily as needed for cough.    Current Outpatient Medications (Other):  Marland Kitchen  ALPRAZolam (XANAX) 1 MG tablet, take 1/2 to 1 tablets by daily for anxiety .  amitriptyline (ELAVIL) 50 MG tablet, Take 1 tablet at Bedtime for Sleep .  bimatoprost (LATISSE) 0.03 % ophthalmic solution, 1 drop on applicator, apply evenly along the skin of the upper eyelid at base of eyelashes at bedtime; repeat procedure for second eye .  buPROPion (WELLBUTRIN XL) 300 MG 24 hr tablet, take 1 tablet by mouth once daily (Patient taking differently: Take 150 mg by mouth 3 (three) times daily. ) .  Cholecalciferol (VITAMIN D PO), Take 5,000 Units by mouth daily. Marland Kitchen  doxycycline (VIBRAMYCIN) 100 MG capsule, Take 1 capsule twice daily with food .  esomeprazole (NEXIUM) 20 MG capsule, Take 1 capsule (20 mg total) by mouth daily. .  famotidine (PEPCID) 40 MG tablet, Take 1 tablet (40 mg total) by mouth at bedtime. .  Flaxseed, Linseed, (FLAX SEED OIL) 1300 MG CAPS, Take 1,300 mg by mouth 3 (three) times daily. .  methylphenidate (RITALIN) 20 MG tablet, Take 1/2-1 tablet 2  x /day as directed for depression.  Do not take more than 5 days per week . Should last longer than 30 days. .  Multiple Vitamin (MULTIVITAMIN) tablet, Take 1 tablet by mouth daily. .  Omega-3 Fatty Acids (FISH OIL) 1000 MG CAPS, Take 1 capsule (1,000 mg total) by  mouth 3 (three) times daily. .  potassium chloride SA (K-DUR,KLOR-CON) 20 MEQ tablet, TAKE 1 TABLET BY MOUTH THREE TIMES DAILY .  sucralfate (CARAFATE) 1 g tablet, Take 1 tablet dissolved in water 4 x /day before Meals & Bedtime for Heartburn .  valACYclovir (VALTREX) 500 MG tablet, Take two tablets by mouth twice a day at onset of symptoms. .  Venlafaxine HCl (EFFEXOR PO), Take 75 mg by mouth daily.   Allergies  Allergen Reactions  . Codeine Other (See Comments)    Abdominal pain  . Erythromycin Base     GI upset  . Penicillins Rash    ROS: all negative except above.   Physical Exam: Filed Weights   08/18/19 1032  Weight: 167 lb (75.8 kg)   BP 118/66   Pulse 99   Temp 97.6 F (36.4 C)  Wt 167 lb (75.8 kg)   SpO2 97%   BMI 28.67 kg/m  General Appearance: Well nourished, in no apparent distress. Eyes: PERRLA, EOMs, conjunctiva no swelling or erythema Sinuses: No Frontal/maxillary tenderness ENT/Mouth: Ext aud canals clear, TMs without erythema, bulging. No erythema, swelling, or exudate on post pharynx.  Tonsils not swollen or erythematous. Hearing normal.  Neck: Supple, thyroid normal.  Respiratory: Respiratory effort normal, BS equal bilaterally without rales, rhonchi, wheezing or stridor.  Cardio: RRR with no MRGs. Brisk peripheral pulses without edema.  Abdomen: Soft, + BS.  Non tender, no guarding, rebound, hernias, masses. Lymphatics: Non tender without lymphadenopathy.  Musculoskeletal: Full ROM, 5/5 strength, normal gait.  Skin: Warm, dry without rashes, lesions, ecchymosis.  Neuro: Cranial nerves intact. Normal muscle tone, no cerebellar symptoms. Sensation intact.  Psych: Awake and oriented X 3, normal affect, Insight and Judgment appropriate.     Vicie Mutters, PA-C 10:40 AM [facility]

## 2019-08-18 NOTE — Patient Instructions (Addendum)
Stop any liquid sugars like juice or milk.  Try to get 80 oz of water a day.   Make sure you are NOT on a biotin supplement like in your mult vitamin before you come see me next time for labs, need to be off x 2 weeks, this can effect your TSH number.   Stop the amitriptyline- this medication can cause weight gain.   WATER IS IMPORTANT  Being dehydrated can hurt your kidneys, cause fatigue, headaches, muscle aches, joint pain, and dry skin/nails so please increase your fluids.   Drink 80-100 oz a day of water, measure it out! Eat 3 meals a day, have to do breakfast, eat protein- hard boiled eggs, protein bar like nature valley protein bar, greek yogurt like oikos triple zero, chobani 100, or light n fit greek  Can check out plantnanny app on your phone to help you keep track of your water  TOPAMAX Going to start you on topamax, start on 1/2 pill for 3-5 nights, can increase to a whole pill for 1-2 weeks.   This medication is good for weight loss, headaches, pain This medication can cause numbness, tingling and can cause brain fog- stop if you get these If you get blurry vision or have a history of glaucoma please stop this medication.  Let me know how you are doing with this.   Topiramate tablets What is this medicine? TOPIRAMATE (toe PYRE a mate) is used to treat seizures in adults or children with epilepsy. It is also used for the prevention of migraine headaches. This medicine may be used for other purposes; ask your health care provider or pharmacist if you have questions. COMMON BRAND NAME(S): Topamax, Topiragen What should I tell my health care provider before I take this medicine? They need to know if you have any of these conditions: -bleeding disorders -cirrhosis of the liver or liver disease -diarrhea -glaucoma -kidney stones or kidney disease -low blood counts, like low white cell, platelet, or red cell counts -lung disease like asthma, obstructive pulmonary disease,  emphysema -metabolic acidosis -on a ketogenic diet -schedule for surgery or a procedure -suicidal thoughts, plans, or attempt; a previous suicide attempt by you or a family member -an unusual or allergic reaction to topiramate, other medicines, foods, dyes, or preservatives -pregnant or trying to get pregnant -breast-feeding How should I use this medicine? Take this medicine by mouth with a glass of water. Follow the directions on the prescription label. Do not crush or chew. You may take this medicine with meals. Take your medicine at regular intervals. Do not take it more often than directed. Talk to your pediatrician regarding the use of this medicine in children. Special care may be needed. While this drug may be prescribed for children as young as 36 years of age for selected conditions, precautions do apply. Overdosage: If you think you have taken too much of this medicine contact a poison control center or emergency room at once. NOTE: This medicine is only for you. Do not share this medicine with others. What if I miss a dose? If you miss a dose, take it as soon as you can. If your next dose is to be taken in less than 6 hours, then do not take the missed dose. Take the next dose at your regular time. Do not take double or extra doses. What may interact with this medicine? Do not take this medicine with any of the following medications: -probenecid This medicine may also interact with the  following medications: -acetazolamide -alcohol -amitriptyline -aspirin and aspirin-like medicines -birth control pills -certain medicines for depression -certain medicines for seizures -certain medicines that treat or prevent blood clots like warfarin, enoxaparin, dalteparin, apixaban, dabigatran, and rivaroxaban -digoxin -hydrochlorothiazide -lithium -medicines for pain, sleep, or muscle relaxation -metformin -methazolamide -NSAIDS, medicines for pain and inflammation, like ibuprofen or  naproxen -pioglitazone -risperidone This list may not describe all possible interactions. Give your health care provider a list of all the medicines, herbs, non-prescription drugs, or dietary supplements you use. Also tell them if you smoke, drink alcohol, or use illegal drugs. Some items may interact with your medicine. What should I watch for while using this medicine? Visit your doctor or health care professional for regular checks on your progress. Do not stop taking this medicine suddenly. This increases the risk of seizures if you are using this medicine to control epilepsy. Wear a medical identification bracelet or chain to say you have epilepsy or seizures, and carry a card that lists all your medicines. This medicine can decrease sweating and increase your body temperature. Watch for signs of deceased sweating or fever, especially in children. Avoid extreme heat, hot baths, and saunas. Be careful about exercising, especially in hot weather. Contact your health care provider right away if you notice a fever or decrease in sweating. You should drink plenty of fluids while taking this medicine. If you have had kidney stones in the past, this will help to reduce your chances of forming kidney stones. If you have stomach pain, with nausea or vomiting and yellowing of your eyes or skin, call your doctor immediately. You may get drowsy, dizzy, or have blurred vision. Do not drive, use machinery, or do anything that needs mental alertness until you know how this medicine affects you. To reduce dizziness, do not sit or stand up quickly, especially if you are an older patient. Alcohol can increase drowsiness and dizziness. Avoid alcoholic drinks. If you notice blurred vision, eye pain, or other eye problems, seek medical attention at once for an eye exam. The use of this medicine may increase the chance of suicidal thoughts or actions. Pay special attention to how you are responding while on this medicine.  Any worsening of mood, or thoughts of suicide or dying should be reported to your health care professional right away. This medicine may increase the chance of developing metabolic acidosis. If left untreated, this can cause kidney stones, bone disease, or slowed growth in children. Symptoms include breathing fast, fatigue, loss of appetite, irregular heartbeat, or loss of consciousness. Call your doctor immediately if you experience any of these side effects. Also, tell your doctor about any surgery you plan on having while taking this medicine since this may increase your risk for metabolic acidosis. Birth control pills may not work properly while you are taking this medicine. Talk to your doctor about using an extra method of birth control. Women who become pregnant while using this medicine may enroll in the [place] Pregnancy Registry by calling [phone]. This registry collects information about the safety of antiepileptic drug use during pregnancy. What side effects may I notice from receiving this medicine? Side effects that you should report to your doctor or health care professional as soon as possible: -allergic reactions like skin rash, itching or hives, swelling of the face, lips, or tongue -decreased sweating and/or rise in body temperature -depression -difficulty breathing, fast or irregular breathing patterns -difficulty speaking -difficulty walking or controlling muscle movements -hearing impairment -redness,  blistering, peeling or loosening of the skin, including inside the mouth -tingling, pain or numbness in the hands or feet -unusual bleeding or bruising -unusually weak or tired -worsening of mood, thoughts or actions of suicide or dying Side effects that usually do not require medical attention (report to your doctor or health care professional if they continue or are bothersome): -altered taste -back pain, joint or muscle aches and  pains -diarrhea, or constipation -headache -loss of appetite -nausea -stomach upset, indigestion -tremors This list may not describe all possible side effects. Call your doctor for medical advice about side effects. You may report side effects to FDA at 1-800-FDA-1088. Where should I keep my medicine? Keep out of the reach of children. Store at room temperature between 15 and 30 degrees C (59 and 86 degrees F) in a tightly closed container. Protect from moisture. Throw away any unused medicine after the expiration date. NOTE: This sheet is a summary. It may not cover all possible information. If you have questions about this medicine, talk to your doctor, pharmacist, or health care provider.  2018 Elsevier/Gold Standard (2013-09-15 23:17:57)   General eating tips  What to Avoid . Avoid added sugars o Often added sugar can be found in processed foods such as many condiments, dry cereals, cakes, cookies, chips, crisps, crackers, candies, sweetened drinks, etc.  o Read labels and AVOID/DECREASE use of foods with the following in their ingredient list: Sugar, fructose, high fructose corn syrup, sucrose, glucose, maltose, dextrose, molasses, cane sugar, brown sugar, any type of syrup, agave nectar, etc.   . Avoid snacking in between meals- drink water or if you feel you need a snack, pick a high water content snack such as cucumbers, watermelon, or any veggie.  Marland Kitchen Avoid foods made with flour o If you are going to eat food made with flour, choose those made with whole-grains; and, minimize your consumption as much as is tolerable . Avoid processed foods o These foods are generally stocked in the middle of the grocery store.  o Focus on shopping on the perimeter of the grocery.  What to Include . Vegetables o GREEN LEAFY VEGETABLES: Kale, spinach, mustard greens, collard greens, cabbage, broccoli, etc. o OTHER: Asparagus, cauliflower, eggplant, carrots, peas, Brussel sprouts, tomatoes, bell  peppers, zucchini, beets, cucumbers, etc. . Grains, seeds, and legumes o Beans: kidney beans, black eyed peas, garbanzo beans, black beans, pinto beans, etc. o Whole, unrefined grains: brown rice, barley, bulgur, oatmeal, etc. . Healthy fats  o Avoid highly processed fats such as vegetable oil o Examples of healthy fats: avocado, olives, virgin olive oil, dark chocolate (?72% Cocoa), nuts (peanuts, almonds, walnuts, cashews, pecans, etc.) o Please still do small amount of these healthy fats, they are dense in calories.  . Low - Moderate Intake of Animal Sources of Protein o Meat sources: chicken, [place], salmon, tuna. Limit to 4 ounces of meat at one time or the size of your palm. o Consider limiting dairy sources, but when choosing dairy focus on: PLAIN [place] yogurt, cottage cheese, high-protein milk . Fruit o Choose berries

## 2019-08-27 ENCOUNTER — Other Ambulatory Visit: Payer: Medicare Other

## 2019-09-01 ENCOUNTER — Telehealth: Payer: Self-pay | Admitting: Physician Assistant

## 2019-09-01 MED ORDER — VENLAFAXINE HCL ER 150 MG PO CP24: 150.0000 mg | ORAL_CAPSULE | Freq: Every day | ORAL | 1 refills | Status: DC

## 2019-09-01 NOTE — Telephone Encounter (Signed)
-----   Message from Elenor Quinones, [place] sent at 09/01/2019 10:26 AM EST ----- Regarding: med take over request Contact: [phone] Was seeing DR. PLOVSKY occasionally. He will no longer prescribe EFFEXOR xr 150mg s. She takes two tablets daily. He recommended a therapist. She has been on this for years, she is out & would like you to take over this for her please. She has not had any for 5 days-please advise.

## 2019-09-01 NOTE — Telephone Encounter (Signed)
Patient has been informed of Meds that were sent to pharmacy.

## 2019-09-01 NOTE — Telephone Encounter (Signed)
Still suggest seeing a therapist but will take over the effexor, can start back on it.   Recent Visits Date Type Provider Dept  08/18/19 Office Visit Vicie Mutters, PA-C [place]  07/24/19 Office Visit Liane Comber, NP [facility]  07/02/19 Office Visit Unk Pinto, MD [facility]  05/21/19 Office Visit Vicie Mutters, PA-C [facility]  04/21/19 Office Visit Vicie Mutters, PA-C [facility]  03/19/19 Office Visit Vicie Mutters, PA-C [place]  01/27/19 Office Visit Liane Comber, NP [facility]  12/03/18 Office Visit Garnet Sierras, NP [facility]  05/21/18 Office Visit Unk Pinto, MD [place] recent visits within past 540 days with a meds authorizing provider and meeting all other requirements   Future Appointments Date Type Provider Dept  10/06/19 Appointment Vicie Mutters, PA-C [facility]  01/08/20 Appointment Unk Pinto, MD [place] future appointments within next 150 days with a meds authorizing provider and meeting all other requirements

## 2019-09-08 ENCOUNTER — Telehealth: Payer: Self-pay | Admitting: Physician Assistant

## 2019-09-08 ENCOUNTER — Other Ambulatory Visit: Payer: Self-pay

## 2019-09-08 ENCOUNTER — Other Ambulatory Visit: Payer: Self-pay | Admitting: Physician Assistant

## 2019-09-08 DIAGNOSIS — F330 Major depressive disorder, recurrent, mild: Secondary | ICD-10-CM | POA: Diagnosis not present

## 2019-09-08 DIAGNOSIS — F909 Attention-deficit hyperactivity disorder, unspecified type: Secondary | ICD-10-CM

## 2019-09-08 MED ORDER — METHYLPHENIDATE HCL 20 MG PO TABS: ORAL_TABLET | ORAL | 0 refills | Status: DC

## 2019-09-08 MED ORDER — POTASSIUM CHLORIDE CRYS ER 20 MEQ PO TBCR: 20.0000 meq | EXTENDED_RELEASE_TABLET | Freq: Three times a day (TID) | ORAL | 1 refills | Status: DC

## 2019-09-08 NOTE — Telephone Encounter (Signed)
-----   Message from Elenor Quinones, [place] sent at 09/08/2019 11:41 AM EST ----- Regarding: OFFICE NOTE/MED REFILL PER PT/YELLOW NOTE:  Refill on RITALIN 20MG S Please & thank you!  Pharmacy:  [facility]

## 2019-10-01 NOTE — Progress Notes (Signed)
FOLLOW UP   Assessment:   Essential hypertension - continue medications, DASH diet, exercise and monitor at home. Call if greater than 130/80.  -     CBC with Diff -     COMPLETE METABOLIC PANEL WITH GFR -     TSH  Hyperlipidemia, mixed -     Lipid Profile  Depression, major, recurrent, in partial remission ([place]) Follow up with psych and counselor No SI/HI Discussed serotonin syndrome risk with meds she is on and given information  Chronic obstructive pulmonary disease, unspecified COPD type ([place]) -     benzonatate (TESSALON) 200 MG capsule; Take 1 capsule (200 mg total) by mouth 3 (three) times daily as needed for cough (Max: 600mg  per day). Continue breo, get CXR, over due  Hypothyroidism -     TSH  Medication management -     Magnesium  Vitamin D deficiency -     Vitamin D (25 hydroxy)  Smoker -     DG Chest 2 View; Future -     benzonatate (TESSALON) 200 MG capsule; Take 1 capsule (200 mg total) by mouth 3 (three) times daily as needed for cough (Max: 600mg  per day). - has not had for several years with cough will check CXR, treat stomach and if CXR normal and CT negative will send to ENT for eval and pulmonary  Attention deficit hyperactivity disorder (ADHD), unspecified ADHD type Continue meds PRN  Over 30 minutes of exam, counseling, chart review, and critical decision making was performed  Future Appointments  Date Time Provider [place]  01/08/2020  2:30 PM Unk Pinto, MD [facility] None  07/28/2020  2:00 PM Unk Pinto, MD [facility] None    Subjective:   Margaret Nelson is a 69 y.o. female who presents for 3 month follow up on hypertension, prediabetes, hyperlipidemia, vitamin D def.   Her blood pressure has been controlled at home, today their BP is BP: 118/66 She does workout. She denies chest pain, shortness of breath, dizziness.  She is following with Dr. Krista Blue, she would like for [place] to take over her medications however her  depression is worse the last 6 weeks, I have advised her she needs to talk with her doctor or find new one, she is on effexor 150 AND wellbutrin 300, ritalin does not take daily and very rare xanax (120 pills last 1 year) and she will follow up with a counselor, Margaret Nelson.   BMI is Body mass index is 26.16 kg/m., she is working on diet and exercise. Last visit she was taken off amitriptyline and put on topamax for back pain/hot flashes to help with weight gain that started with the addition of that medication. She is down 15 lbs.  Wt Readings from Last 3 Encounters:  10/06/19 152 lb 6.4 oz (69.1 kg)  08/18/19 167 lb (75.8 kg)  07/02/19 156 lb 6.4 oz (70.9 kg)    She has cough from allergies per patient, she smokes jewel now, recurrent bronchitis. Has a Breo, on nexium and pepcid, she is on carafate 4 x a day, still with the cough, states it seems like it is more sinuses, she denies fever, chills, SOB, CP. She has not had a chest xray in 2017   She is on cholesterol medication and denies myalgias. Her cholesterol is not at goal. The cholesterol last visit was:  She is on 1/2 of lipitor everyday. .  Lab Results  Component Value Date   CHOL 390 (H) 07/02/2019   HDL  60 07/02/2019   [place]  07/02/2019     Comment:     . LDL cholesterol not calculated. Triglyceride levels greater than 400 mg/dL invalidate calculated LDL results. . Reference range: <100 . Desirable range <100 mg/dL for primary prevention;   <70 mg/dL for patients with CHD or diabetic patients  with > or = 2 CHD risk factors. Marland Kitchen LDL-C is now calculated using the Martin-Hopkins  calculation, which is a validated novel method providing  better accuracy than the Friedewald equation in the  estimation of LDL-C.  Cresenciano Genre et al. Annamaria Helling. [id number]): 2061-2068  (http://education.[facility].com/faq/FAQ164)    TRIG 480 (H) 07/02/2019   CHOLHDL 6.5 (H) 07/02/2019   Last A1C Lab Results  Component Value Date   HGBA1C 5.5  07/02/2019   Patient is on Vitamin D supplement. Lab Results  Component Value Date   VD25OH 21 (L) 07/02/2019      She is on thyroid medication. Her medication was not changed last visit, she is on 1.5 pills a day, takes with coffee in the AM.  She has been off MVIT x 2 weeks. She has been on carafate x 10/29.  Lab Results  Component Value Date   TSH 2.34 07/02/2019  .     Medication Review Current Outpatient Medications on File Prior to Visit  Medication Sig Dispense Refill  . albuterol (PROAIR HFA) 108 (90 Base) MCG/ACT inhaler inhale 2 puffs by mouth every 6 hours if needed for wheezing or shortness of breath 8.5 g 2  . ALPRAZolam (XANAX) 1 MG tablet take 1/2 to 1 tablets by daily for anxiety 120 tablet 0  . atorvastatin (LIPITOR) 80 MG tablet TAKE 1/2 TO 1 TABLET BY MOUTH EVERY DAY AS NEEDED FOR CHOLESTEROL 90 tablet 0  . bimatoprost (LATISSE) 0.03 % ophthalmic solution 1 drop on applicator, apply evenly along the skin of the upper eyelid at base of eyelashes at bedtime; repeat procedure for second eye 3 mL 12  . bumetanide (BUMEX) 2 MG tablet Take 1 tablet 2 x /day for BP & Fluid Retention / Ankle Swelling 180 tablet 3  . buPROPion (WELLBUTRIN XL) 300 MG 24 hr tablet take 1 tablet by mouth once daily (Patient taking differently: Take 150 mg by mouth 3 (three) times daily. ) 90 tablet 1  . Cholecalciferol (VITAMIN D PO) Take 5,000 Units by mouth daily.    Marland Kitchen esomeprazole (NEXIUM) 20 MG capsule Take 1 capsule (20 mg total) by mouth daily. 90 capsule 1  . famotidine (PEPCID) 40 MG tablet Take 1 tablet (40 mg total) by mouth at bedtime. 90 tablet 1  . Flaxseed, Linseed, (FLAX SEED OIL) 1300 MG CAPS Take 1,300 mg by mouth 3 (three) times daily.    . fluticasone furoate-vilanterol (BREO ELLIPTA) 200-25 MCG/INH AEPB Inhale 1 puff into the lungs daily. 60 each 11  . levothyroxine (SYNTHROID) 50 MCG tablet Take 1.5  tablet daily on an empty stomach with only water for 30 minutes & no Antacid  meds, Calcium or Magnesium for 4 hours & avoid Biotin 135 tablet 3  . methylphenidate (RITALIN) 20 MG tablet Take 1/2-1 tablet 2  x /day as directed for depression.  Do not take more than 5 days per week . Should last longer than 30 days. 60 tablet 0  . Multiple Vitamin (MULTIVITAMIN) tablet Take 1 tablet by mouth daily.    . Omega-3 Fatty Acids (FISH OIL) 1000 MG CAPS Take 1 capsule (1,000 mg total) by mouth 3 (three) times daily.  0  . potassium chloride SA (KLOR-CON) 20 MEQ tablet Take 1 tablet (20 mEq total) by mouth 3 (three) times daily. 270 tablet 1  . sucralfate (CARAFATE) 1 g tablet Take 1 tablet dissolved in water 4 x /day before Meals & Bedtime for Heartburn 360 tablet 3  . topiramate (TOPAMAX) 50 MG tablet Take 1 tablet (50 mg total) by mouth at bedtime. 39 tablet 2  . valACYclovir (VALTREX) 500 MG tablet Take two tablets by mouth twice a day at onset of symptoms. 30 tablet 3  . venlafaxine XR (EFFEXOR-XR) 150 MG 24 hr capsule Take 1 capsule (150 mg total) by mouth daily with breakfast. 90 capsule 1   No current facility-administered medications on file prior to visit.    Allergies: Allergies  Allergen Reactions  . Codeine Other (See Comments)    Abdominal pain  . Erythromycin Base     GI upset  . Penicillins Rash    Current Problems (verified) has Essential hypertension; Hyperlipidemia, mixed; Depression, major, recurrent, in partial remission ([place]); Vitamin D deficiency; Hypothyroidism; Medication management; Gastroesophageal reflux disease; Smoker; Extrinsic asthma; Body mass index (BMI) of 23.0-23.9 in adult; Severe recurrent major depression without psychotic features ([place]); Abnormal glucose; Screening for ischemic heart disease; Attention deficit hyperactivity disorder (ADHD); and COPD (chronic obstructive pulmonary disease) ([place]) on their problem list.  Surgical: She  has a past surgical history that includes Cholecystectomy (1975); Nasal sinus surgery (1996);  Liposuction (1998); Breast surgery (Bilateral, 1983); Breast reconstruction (2008); Appendectomy; bil breast lumpectomy; bilateral breast masectomy (no breast cancer); and EUS (N/A, 04/23/2014). Family Her family history includes ADD / ADHD in her son; Alcohol abuse in her father and paternal uncle; Anxiety disorder in her sister; COPD in her mother; Dementia in her mother; Depression in her mother and sister; Diabetes in her sister; Early death in her father and sister; Emphysema in her mother; Lung cancer in her mother; Schizophrenia in her sister. Social history  She reports that she has been smoking e-cigarettes. She has been smoking about 0.50 packs per day. She has never used smokeless tobacco. She reports current alcohol use of about 4.0 - 5.0 standard drinks of alcohol per week. She reports that she does not use drugs.    Objective:   Today's Vitals   10/06/19 1440  BP: 118/66  Pulse: 86  Temp: 97.6 F (36.4 C)  SpO2: 95%  Weight: 152 lb 6.4 oz (69.1 kg)   Body mass index is 26.16 kg/m. General appearance: alert, no distress, WD/WN, female HEENT: normocephalic, sclerae anicteric, TMs pearly, nares patent, no discharge or erythema, pharynx normal Oral cavity: MMM, no lesions Neck: supple, no lymphadenopathy, no thyromegaly, no masses Heart: RRR, normal S1, S2, no murmurs Lungs: CTA bilaterally, no wheezes, rhonchi, or rales Abdomen: +bs, soft, non tender, non distended, no masses, no hepatomegaly, no splenomegaly Musculoskeletal: nontender, no swelling, no obvious deformity Extremities: no edema, no cyanosis, no clubbing Pulses: 2+ symmetric, upper and lower extremities, normal cap refill Neurological: alert, oriented x 3, CN2-12 intact, strength normal upper extremities and lower extremities, sensation normal throughout, DTRs 2+ throughout, no cerebellar signs, gait normal Psychiatric: normal affect, behavior normal, pleasant    Vicie Mutters, PA-C   10/06/2019

## 2019-10-02 ENCOUNTER — Other Ambulatory Visit: Payer: Self-pay | Admitting: Physician Assistant

## 2019-10-06 ENCOUNTER — Ambulatory Visit (INDEPENDENT_AMBULATORY_CARE_PROVIDER_SITE_OTHER): Payer: Medicare Other | Admitting: Physician Assistant

## 2019-10-06 ENCOUNTER — Encounter: Payer: Self-pay | Admitting: Physician Assistant

## 2019-10-06 ENCOUNTER — Other Ambulatory Visit: Payer: Self-pay

## 2019-10-06 VITALS — BP 118/66 | HR 86 | Temp 97.6°F | Wt 152.4 lb

## 2019-10-06 DIAGNOSIS — E782 Mixed hyperlipidemia: Secondary | ICD-10-CM

## 2019-10-06 DIAGNOSIS — J449 Chronic obstructive pulmonary disease, unspecified: Secondary | ICD-10-CM

## 2019-10-06 DIAGNOSIS — F909 Attention-deficit hyperactivity disorder, unspecified type: Secondary | ICD-10-CM

## 2019-10-06 DIAGNOSIS — F3341 Major depressive disorder, recurrent, in partial remission: Secondary | ICD-10-CM | POA: Diagnosis not present

## 2019-10-06 DIAGNOSIS — E079 Disorder of thyroid, unspecified: Secondary | ICD-10-CM | POA: Diagnosis not present

## 2019-10-06 DIAGNOSIS — Z79899 Other long term (current) drug therapy: Secondary | ICD-10-CM | POA: Diagnosis not present

## 2019-10-06 DIAGNOSIS — E559 Vitamin D deficiency, unspecified: Secondary | ICD-10-CM

## 2019-10-06 DIAGNOSIS — I10 Essential (primary) hypertension: Secondary | ICD-10-CM

## 2019-10-06 DIAGNOSIS — F17200 Nicotine dependence, unspecified, uncomplicated: Secondary | ICD-10-CM

## 2019-10-06 MED ORDER — BENZONATATE 200 MG PO CAPS: 200.0000 mg | ORAL_CAPSULE | Freq: Three times a day (TID) | ORAL | 0 refills | Status: DC | PRN

## 2019-10-06 NOTE — Patient Instructions (Addendum)
Please be aware that some of the medications that you are on can sometimes cause a rare and potentially dangerous adverse reaction, called SEROTONIN SYNDROME: Symptoms of this condition include (but are not limited to):  Agitation or restlessness, confusion, rapid heart rate and high blood pressure, dilated pupils, loss of muscle coordination or twitching muscles, muscle rigidity/stiffness, sweating and/or flushing, diarrhea, headache, shivering, goose bumps. If you have any of these symptoms you may have to stop the medication. Call your health care provider immediately.  Severe serotonin syndrome can be life-threatening emergency. Signs and symptoms of a severe reaction may include: high fever, seizures, irregular heartbeat, unconsciousness or altered level of awareness or personality changes.  If you have any of these new symptoms, call 911 or have someone take you to the emergency room.   Follow up with psych for you medications and the depression  Stop the carafate or the medication that you are stirring into water 4 x a day Increase the nexium to 2 pills a day and we will increase to 40 mg for the cough Continue the pepcid 40 mg at at night  INFORMATION ABOUT YOUR XRAY  Can walk into [address] for an Insurance account manager. They will have the order and take you back. You do not any paper work, I should get the result back today or tomorrow. This order is good for a year.  Can call [phone] to schedule an appointment if you wish.   Common causes of cough OR hoarseness OR sore throat:   Allergies, Viral Infections, Acid Reflux and Bacterial Infections.   Allergies and viral infections cause a cough OR sore throat by post nasal drip and are often worse at night, can also have sneezing, lower grade fevers, clear/yellow mucus. This is best treated with allergy medications or nasal sprays.  Please get on allegra for 1-2 weeks The strongest is allegra or fexafinadine  Cheapest at [employer],  [employer], [employer]   Bacterial infections are more severe than allergies or viral infections with fever, teeth pain, fatigue. This can be treated with prednisone and the same over the counter medication and after 7 days can be treated with an antibiotic.   Silent reflux/GERD can cause a cough OR sore throat OR hoarseness WITHOUT heart burn because the esophagus that goes to the stomach and trachea that goes to the lungs are very close and when you lay down the acid can irritate your throat and lungs. This can cause hoarseness, cough, and wheezing. Please stop any alcohol or anti-inflammatories like aleve/advil/ibuprofen and start an over the counter Prilosec or omeprazole 1-2 times daily 56mins before food for 2 weeks, then switch to over the counter zantac/ratinidine or pepcid/famotadine once at night for 2 weeks.    sometimes irritation causes more irritation. Try voice rest, use sugar free cough drops to prevent coughing, and try to stop clearing your throat.   If you ever have a cough that does not go away after trying these things please make a follow up visit for further evaluation or we can refer you to a specialist, ENT.   Or if you ever have shortness of breath or chest pain go to the ER.     .The patient was advised to call immediately if she has any concerning symptoms in the interval. The patient voices understanding of current treatment options and is in agreement with the current care plan.The patient knows to call the clinic with any problems, questions or concerns or go to the ER  if any further progression of symptoms.

## 2019-10-07 LAB — LIPID PANEL
Cholesterol: 135 mg/dL (ref <200)
HDL: 54 mg/dL (ref >=50)
LDL Cholesterol (Calc): 59 mg/dL (calc)
Non-HDL Cholesterol (Calc): 81 mg/dL (calc) (ref <130)
Total CHOL/HDL Ratio: 2.5 (calc) (ref <5.0)
Triglycerides: 132 mg/dL (ref <150)

## 2019-10-07 LAB — CBC WITH DIFFERENTIAL/PLATELET
Absolute Monocytes: 589 cells/uL (ref 200–950)
Basophils Absolute: 83 cells/uL (ref 0–200)
Basophils Relative: 1 %
Eosinophils Absolute: 357 cells/uL (ref 15–500)
Eosinophils Relative: 4.3 %
HCT: 38.8 % (ref 35.0–45.0)
Hemoglobin: 12.6 g/dL (ref 11.7–15.5)
Lymphs Abs: 3279 cells/uL (ref 850–3900)
MCH: 29.1 pg (ref 27.0–33.0)
MCHC: 32.5 g/dL (ref 32.0–36.0)
MCV: 89.6 fL (ref 80.0–100.0)
MPV: 11.4 fL (ref 7.5–12.5)
Monocytes Relative: 7.1 %
Neutro Abs: 3992 cells/uL (ref 1500–7800)
Neutrophils Relative %: 48.1 %
Platelets: 323 10*3/uL (ref 140–400)
RBC: 4.33 10*6/uL (ref 3.80–5.10)
RDW: 13.9 % (ref 11.0–15.0)
Total Lymphocyte: 39.5 %
WBC: 8.3 10*3/uL (ref 3.8–10.8)

## 2019-10-07 LAB — COMPLETE METABOLIC PANEL WITH GFR
AG Ratio: 1.8 (calc) (ref 1.0–2.5)
ALT: 19 U/L (ref 6–29)
AST: 14 U/L (ref 10–35)
Albumin: 4.1 g/dL (ref 3.6–5.1)
Alkaline phosphatase (APISO): 85 U/L (ref 37–153)
BUN: 14 mg/dL (ref 7–25)
CO2: 28 mmol/L (ref 20–32)
Calcium: 9 mg/dL (ref 8.6–10.4)
Chloride: 103 mmol/L (ref 98–110)
Creat: 0.79 mg/dL (ref 0.50–0.99)
GFR, Est African American: 90 mL/min/{1.73_m2} (ref >=60)
GFR, Est Non African American: 78 mL/min/{1.73_m2} (ref >=60)
Globulin: 2.3 g/dL (calc) (ref 1.9–3.7)
Glucose, Bld: 82 mg/dL (ref 65–99)
Potassium: 3.9 mmol/L (ref 3.5–5.3)
Sodium: 141 mmol/L (ref 135–146)
Total Bilirubin: 0.3 mg/dL (ref 0.2–1.2)
Total Protein: 6.4 g/dL (ref 6.1–8.1)

## 2019-10-07 LAB — VITAMIN D 25 HYDROXY (VIT D DEFICIENCY, FRACTURES): Vit D, 25-Hydroxy: 69 ng/mL (ref 30–100)

## 2019-10-07 LAB — MAGNESIUM: Magnesium: 2.1 mg/dL (ref 1.5–2.5)

## 2019-10-07 LAB — TSH: TSH: 0.21 mIU/L — ABNORMAL LOW (ref 0.40–4.50)

## 2019-10-09 NOTE — Progress Notes (Signed)
10/09/19--PATIENT IS AWARE OF LAB RESULTS AND INSTRUCTIONS. - E WELCH

## 2019-10-22 NOTE — Progress Notes (Signed)
Assessment and Plan: Hypothyroidism -     TSH; Future - stop MVIT and carafate and repeat in 2 weeks off those medications  Chronic obstructive pulmonary disease, unspecified COPD type ([place]) Cough is better, will still get Xray, has been 3 years  Weight gain due to medication -     topiramate (TOPAMAX) 50 MG tablet; Take 1 tablet (50 mg total) by mouth 2 (two) times daily. - weight going down off the amitriptyline, increase topamax to 50mg  BID for weight loss, information given, follow up in April    Future Appointments  Date Time Provider [place]  01/08/2020  2:30 PM Unk Pinto, MD [facility] None  07/28/2020  2:00 PM Unk Pinto, MD [facility] None     HPI 69 y.o.female presents for follow up for thyroid.  She has cough from allergies per patient, she smokes jewel now, recurrent bronchitis, states cough has improved. Has a Breo, on nexium and pepcid. She has not had a chest xray in 2017, will go get order.   she is on 1.5 pills a day, takes with coffee in the AM.  She has been off MVIT only for 2 days. She has been off carafate  Lab Results  Component Value Date   TSH 0.21 (L) 10/06/2019   BMI is Body mass index is 25.82 kg/m., she is working on diet and exercise. Switched from amitriptyline to Topamax with weight loss but she feels her clothes still not fit despite the weight loss. Drinking 100oz of water a day.  Wt Readings from Last 3 Encounters:  10/23/19 150 lb 6.4 oz (68.2 kg)  10/06/19 152 lb 6.4 oz (69.1 kg)  08/18/19 167 lb (75.8 kg)     Patient Active Problem List   Diagnosis Date Noted  . COPD (chronic obstructive pulmonary disease) ([place]) 07/01/2019  . Abnormal glucose 05/21/2018  . Screening for ischemic heart disease 05/21/2018  . Attention deficit hyperactivity disorder (ADHD) 05/21/2018  . Severe recurrent major depression without psychotic features ([place]) 09/17/2017  . Extrinsic asthma 07/29/2015  . Body mass index (BMI) of  23.0-23.9 in adult 07/29/2015  . Smoker 04/14/2015  . Gastroesophageal reflux disease 02/15/2015  . Medication management 02/12/2014  . Hypothyroidism   . Essential hypertension 08/18/2013  . Hyperlipidemia, mixed 08/18/2013  . Depression, major, recurrent, in partial remission ([place]) 08/18/2013  . Vitamin D deficiency 08/18/2013     Current Outpatient Medications (Endocrine & Metabolic):  .  levothyroxine (SYNTHROID) 50 MCG tablet, Take 1.5  tablet daily on an empty stomach with only water for 30 minutes & no Antacid meds, Calcium or Magnesium for 4 hours & avoid Biotin  Current Outpatient Medications (Cardiovascular):  .  atorvastatin (LIPITOR) 80 MG tablet, TAKE 1/2 TO 1 TABLET BY MOUTH EVERY DAY AS NEEDED FOR CHOLESTEROL .  bumetanide (BUMEX) 2 MG tablet, Take 1 tablet 2 x /day for BP & Fluid Retention / Ankle Swelling  Current Outpatient Medications (Respiratory):  .  albuterol (PROAIR HFA) 108 (90 Base) MCG/ACT inhaler, inhale 2 puffs by mouth every 6 hours if needed for wheezing or shortness of breath .  benzonatate (TESSALON) 200 MG capsule, Take 1 capsule (200 mg total) by mouth 3 (three) times daily as needed for cough (Max: 600mg  per day). .  fluticasone furoate-vilanterol (BREO ELLIPTA) 200-25 MCG/INH AEPB, Inhale 1 puff into the lungs daily.    Current Outpatient Medications (Other):  Marland Kitchen  ALPRAZolam (XANAX) 1 MG tablet, take 1/2 to 1 tablets by daily for anxiety .  bimatoprost (LATISSE) 0.03 % ophthalmic solution, 1 drop on applicator, apply evenly along the skin of the upper eyelid at base of eyelashes at bedtime; repeat procedure for second eye .  buPROPion (WELLBUTRIN XL) 300 MG 24 hr tablet, take 1 tablet by mouth once daily (Patient taking differently: Take 150 mg by mouth 3 (three) times daily. ) .  Cholecalciferol (VITAMIN D PO), Take 5,000 Units by mouth daily. Marland Kitchen  esomeprazole (NEXIUM) 20 MG capsule, Take 1 capsule (20 mg total) by mouth daily. .  famotidine  (PEPCID) 40 MG tablet, Take 1 tablet (40 mg total) by mouth at bedtime. .  Flaxseed, Linseed, (FLAX SEED OIL) 1300 MG CAPS, Take 1,300 mg by mouth 3 (three) times daily. .  Multiple Vitamin (MULTIVITAMIN) tablet, Take 1 tablet by mouth daily. .  Omega-3 Fatty Acids (FISH OIL) 1000 MG CAPS, Take 1 capsule (1,000 mg total) by mouth 3 (three) times daily. .  potassium chloride SA (KLOR-CON) 20 MEQ tablet, Take 1 tablet (20 mEq total) by mouth 3 (three) times daily. Marland Kitchen  topiramate (TOPAMAX) 50 MG tablet, Take 1 tablet (50 mg total) by mouth 2 (two) times daily. .  valACYclovir (VALTREX) 500 MG tablet, Take two tablets by mouth twice a day at onset of symptoms. Marland Kitchen  venlafaxine XR (EFFEXOR-XR) 150 MG 24 hr capsule, Take 1 capsule (150 mg total) by mouth daily with breakfast. .  methylphenidate (RITALIN) 20 MG tablet, Take 1/2-1 tablet 2  x /day as directed for depression.  Do not take more than 5 days per week . Should last longer than 30 days.  Allergies  Allergen Reactions  . Codeine Other (See Comments)    Abdominal pain  . Erythromycin Base     GI upset  . Penicillins Rash    ROS: all negative except above.   Physical Exam: Filed Weights   10/23/19 1503  Weight: 150 lb 6.4 oz (68.2 kg)   BP 130/74   Pulse 88   Temp (!) 97.5 F (36.4 C)   Wt 150 lb 6.4 oz (68.2 kg)   SpO2 95%   BMI 25.82 kg/m  General Appearance: Well nourished, in no apparent distress. Eyes: PERRLA, EOMs, conjunctiva no swelling or erythema Sinuses: No Frontal/maxillary tenderness ENT/Mouth: Ext aud canals clear, TMs without erythema, bulging. No erythema, swelling, or exudate on post pharynx.  Tonsils not swollen or erythematous. Hearing normal.  Neck: Supple, thyroid normal.  Respiratory: Respiratory effort normal, BS equal bilaterally without rales, rhonchi, wheezing or stridor.  Cardio: RRR with no MRGs. Brisk peripheral pulses without edema.  Abdomen: Soft, + BS.  Non tender, no guarding, rebound, hernias,  masses. Lymphatics: Non tender without lymphadenopathy.  Musculoskeletal: Full ROM, 5/5 strength, normal gait.  Skin: Warm, dry without rashes, lesions, ecchymosis.  Neuro: Cranial nerves intact. Normal muscle tone, no cerebellar symptoms. Sensation intact.  Psych: Awake and oriented X 3, normal affect, Insight and Judgment appropriate.     Vicie Mutters, PA-C 3:21 PM [facility]

## 2019-10-23 ENCOUNTER — Ambulatory Visit: Payer: Medicare Other | Admitting: Physician Assistant

## 2019-10-23 ENCOUNTER — Other Ambulatory Visit: Payer: Self-pay

## 2019-10-23 ENCOUNTER — Ambulatory Visit (INDEPENDENT_AMBULATORY_CARE_PROVIDER_SITE_OTHER): Payer: Medicare Other | Admitting: Physician Assistant

## 2019-10-23 ENCOUNTER — Encounter: Payer: Self-pay | Admitting: Physician Assistant

## 2019-10-23 ENCOUNTER — Ambulatory Visit: Payer: Medicare Other

## 2019-10-23 VITALS — BP 130/74 | HR 88 | Temp 97.5°F | Wt 150.4 lb

## 2019-10-23 DIAGNOSIS — R635 Abnormal weight gain: Secondary | ICD-10-CM

## 2019-10-23 DIAGNOSIS — E079 Disorder of thyroid, unspecified: Secondary | ICD-10-CM

## 2019-10-23 DIAGNOSIS — J449 Chronic obstructive pulmonary disease, unspecified: Secondary | ICD-10-CM | POA: Diagnosis not present

## 2019-10-23 DIAGNOSIS — T50905A Adverse effect of unspecified drugs, medicaments and biological substances, initial encounter: Secondary | ICD-10-CM

## 2019-10-23 MED ORDER — TOPIRAMATE 50 MG PO TABS: 50.0000 mg | ORAL_TABLET | Freq: Two times a day (BID) | ORAL | 1 refills | Status: DC

## 2019-10-23 NOTE — Patient Instructions (Addendum)
INFORMATION ABOUT YOUR XRAY PLEASE GET XRAY AT [facility] Can walk into [address] for an Insurance account manager. They will have the order and take you back. You do not any paper work, I should get the result back today or tomorrow. This order is good for a year.  Can call [phone] to schedule an appointment if you wish.   TOPAMAX Can increase to 1 pill at night and 1/2-1 pill in the AM.  This medication is good for weight loss, headaches, pain This medication can cause numbness, tingling and can cause brain fog- stop if you get these If you get blurry vision or have a history of glaucoma please stop this medication.  Let me know how you are doing with this.   Topiramate tablets What is this medicine? TOPIRAMATE (toe PYRE a mate) is used to treat seizures in adults or children with epilepsy. It is also used for the prevention of migraine headaches. This medicine may be used for other purposes; ask your health care provider or pharmacist if you have questions. COMMON BRAND NAME(S): Topamax, Topiragen What should I tell my health care provider before I take this medicine? They need to know if you have any of these conditions: -bleeding disorders -cirrhosis of the liver or liver disease -diarrhea -glaucoma -kidney stones or kidney disease -low blood counts, like low white cell, platelet, or red cell counts -lung disease like asthma, obstructive pulmonary disease, emphysema -metabolic acidosis -on a ketogenic diet -schedule for surgery or a procedure -suicidal thoughts, plans, or attempt; a previous suicide attempt by you or a family member -an unusual or allergic reaction to topiramate, other medicines, foods, dyes, or preservatives -pregnant or trying to get pregnant -breast-feeding How should I use this medicine? Take this medicine by mouth with a glass of water. Follow the directions on the prescription label. Do not crush or chew. You may take this medicine with meals. Take  your medicine at regular intervals. Do not take it more often than directed. Talk to your pediatrician regarding the use of this medicine in children. Special care may be needed. While this drug may be prescribed for children as young as 68 years of age for selected conditions, precautions do apply. Overdosage: If you think you have taken too much of this medicine contact a poison control center or emergency room at once. NOTE: This medicine is only for you. Do not share this medicine with others. What if I miss a dose? If you miss a dose, take it as soon as you can. If your next dose is to be taken in less than 6 hours, then do not take the missed dose. Take the next dose at your regular time. Do not take double or extra doses. What may interact with this medicine? Do not take this medicine with any of the following medications: -probenecid This medicine may also interact with the following medications: -acetazolamide -alcohol -amitriptyline -aspirin and aspirin-like medicines -birth control pills -certain medicines for depression -certain medicines for seizures -certain medicines that treat or prevent blood clots like warfarin, enoxaparin, dalteparin, apixaban, dabigatran, and rivaroxaban -digoxin -hydrochlorothiazide -lithium -medicines for pain, sleep, or muscle relaxation -metformin -methazolamide -NSAIDS, medicines for pain and inflammation, like ibuprofen or naproxen -pioglitazone -risperidone This list may not describe all possible interactions. Give your health care provider a list of all the medicines, herbs, non-prescription drugs, or dietary supplements you use. Also tell them if you smoke, drink alcohol, or use illegal drugs. Some items may interact with  your medicine. What should I watch for while using this medicine? Visit your doctor or health care professional for regular checks on your progress. Do not stop taking this medicine suddenly. This increases the risk of  seizures if you are using this medicine to control epilepsy. Wear a medical identification bracelet or chain to say you have epilepsy or seizures, and carry a card that lists all your medicines. This medicine can decrease sweating and increase your body temperature. Watch for signs of deceased sweating or fever, especially in children. Avoid extreme heat, hot baths, and saunas. Be careful about exercising, especially in hot weather. Contact your health care provider right away if you notice a fever or decrease in sweating. You should drink plenty of fluids while taking this medicine. If you have had kidney stones in the past, this will help to reduce your chances of forming kidney stones. If you have stomach pain, with nausea or vomiting and yellowing of your eyes or skin, call your doctor immediately. You may get drowsy, dizzy, or have blurred vision. Do not drive, use machinery, or do anything that needs mental alertness until you know how this medicine affects you. To reduce dizziness, do not sit or stand up quickly, especially if you are an older patient. Alcohol can increase drowsiness and dizziness. Avoid alcoholic drinks. If you notice blurred vision, eye pain, or other eye problems, seek medical attention at once for an eye exam. The use of this medicine may increase the chance of suicidal thoughts or actions. Pay special attention to how you are responding while on this medicine. Any worsening of mood, or thoughts of suicide or dying should be reported to your health care professional right away. This medicine may increase the chance of developing metabolic acidosis. If left untreated, this can cause kidney stones, bone disease, or slowed growth in children. Symptoms include breathing fast, fatigue, loss of appetite, irregular heartbeat, or loss of consciousness. Call your doctor immediately if you experience any of these side effects. Also, tell your doctor about any surgery you plan on having while  taking this medicine since this may increase your risk for metabolic acidosis. Birth control pills may not work properly while you are taking this medicine. Talk to your doctor about using an extra method of birth control. Women who become pregnant while using this medicine may enroll in the [place] Pregnancy Registry by calling [phone]. This registry collects information about the safety of antiepileptic drug use during pregnancy. What side effects may I notice from receiving this medicine? Side effects that you should report to your doctor or health care professional as soon as possible: -allergic reactions like skin rash, itching or hives, swelling of the face, lips, or tongue -decreased sweating and/or rise in body temperature -depression -difficulty breathing, fast or irregular breathing patterns -difficulty speaking -difficulty walking or controlling muscle movements -hearing impairment -redness, blistering, peeling or loosening of the skin, including inside the mouth -tingling, pain or numbness in the hands or feet -unusual bleeding or bruising -unusually weak or tired -worsening of mood, thoughts or actions of suicide or dying Side effects that usually do not require medical attention (report to your doctor or health care professional if they continue or are bothersome): -altered taste -back pain, joint or muscle aches and pains -diarrhea, or constipation -headache -loss of appetite -nausea -stomach upset, indigestion -tremors This list may not describe all possible side effects. Call your doctor for medical advice about side effects. You may report  side effects to FDA at 1-800-FDA-1088. Where should I keep my medicine? Keep out of the reach of children. Store at room temperature between 15 and 30 degrees C (59 and 86 degrees F) in a tightly closed container. Protect from moisture. Throw away any unused medicine after the expiration date. NOTE:  This sheet is a summary. It may not cover all possible information. If you have questions about this medicine, talk to your doctor, pharmacist, or health care provider.  2018 Elsevier/Gold Standard (2013-09-15 23:17:57)

## 2019-11-04 ENCOUNTER — Other Ambulatory Visit: Payer: Self-pay | Admitting: Internal Medicine

## 2019-11-04 DIAGNOSIS — F909 Attention-deficit hyperactivity disorder, unspecified type: Secondary | ICD-10-CM

## 2019-11-04 MED ORDER — METHYLPHENIDATE HCL 20 MG PO TABS: ORAL_TABLET | ORAL | 0 refills | Status: DC

## 2019-11-06 ENCOUNTER — Other Ambulatory Visit: Payer: Self-pay

## 2019-11-06 ENCOUNTER — Other Ambulatory Visit: Payer: Medicare Other

## 2019-11-06 DIAGNOSIS — E079 Disorder of thyroid, unspecified: Secondary | ICD-10-CM

## 2019-11-06 LAB — TSH: TSH: 0.2 mIU/L — ABNORMAL LOW (ref 0.40–4.50)

## 2019-11-13 ENCOUNTER — Other Ambulatory Visit: Payer: Self-pay | Admitting: Physician Assistant

## 2019-11-13 DIAGNOSIS — K2100 Gastro-esophageal reflux disease with esophagitis, without bleeding: Secondary | ICD-10-CM

## 2019-11-20 ENCOUNTER — Ambulatory Visit: Payer: Medicare Other | Admitting: Internal Medicine

## 2019-11-30 NOTE — Progress Notes (Signed)
History of Present Illness:     Patient presents for evaluation of a pruritic rash of forearms & back.      Patient is on thyroid replacement with 50 mcg and had been on 1.5 tabs /daily, but 3 and 4 weeks ago TSH's were suppressed at 0.21 and 0,20 and patient was advised to taper doe to 1 pill 3 x /week and 1.5 tab 4 x /week. Today - 1 month later, now  patient relates that she alternates 1.5 tab and 1 tab qod.      Patient also relates she's lost weight since stopping Amitriptyline and starting Topiramate.   Wt Readings from Last 3 Encounters:  10/23/19 150 lb 6.4 oz (68.2 kg)  10/06/19 152 lb 6.4 oz (69.1 kg)  08/18/19 167 lb (75.8 kg)   Medications  Current Outpatient Medications (Endocrine & Metabolic):  .  levothyroxine (SYNTHROID) 50 MCG tablet, Take 1.5  tablet daily on an empty stomach with only water for 30 minutes & no Antacid meds, Calcium or Magnesium for 4 hours & avoid Biotin  Current Outpatient Medications (Cardiovascular):  .  atorvastatin (LIPITOR) 80 MG tablet, TAKE 1/2 TO 1 TABLET BY MOUTH EVERY DAY AS NEEDED FOR CHOLESTEROL .  bumetanide (BUMEX) 2 MG tablet, Take 1 tablet 2 x /day for BP & Fluid Retention / Ankle Swelling  Current Outpatient Medications (Respiratory):  .  albuterol (PROAIR HFA) 108 (90 Base) MCG/ACT inhaler, inhale 2 puffs by mouth every 6 hours if needed for wheezing or shortness of breath .  benzonatate (TESSALON) 200 MG capsule, Take 1 capsule (200 mg total) by mouth 3 (three) times daily as needed for cough (Max: 600mg  per day). .  fluticasone furoate-vilanterol (BREO ELLIPTA) 200-25 MCG/INH AEPB, Inhale 1 puff into the lungs daily.    Current Outpatient Medications (Other):  Marland Kitchen  ALPRAZolam (XANAX) 1 MG tablet, take 1/2 to 1 tablets by daily for anxiety .  bimatoprost (LATISSE) 0.03 % ophthalmic solution, 1 drop on applicator, apply evenly along the skin of the upper eyelid at base of eyelashes at bedtime; repeat procedure for second eye .   buPROPion (WELLBUTRIN XL) 300 MG 24 hr tablet, take 1 tablet by mouth once daily (Patient taking differently: Take 150 mg by mouth 3 (three) times daily. ) .  Cholecalciferol (VITAMIN D PO), Take 5,000 Units by mouth daily. Marland Kitchen  esomeprazole (NEXIUM) 20 MG capsule, Take 1 capsule every morning for Indigestion & GERD .  famotidine (PEPCID) 40 MG tablet, Take 1 tablet at Bedtime for GERD .  Flaxseed, Linseed, (FLAX SEED OIL) 1300 MG CAPS, Take 1,300 mg by mouth 3 (three) times daily. .  methylphenidate (RITALIN) 20 MG tablet, Take 1/2-1 tablet 2  x /day as directed for depression.  Do not take more than 5 days per week . Should last longer than 30 days. .  Multiple Vitamin (MULTIVITAMIN) tablet, Take 1 tablet by mouth daily. .  Omega-3 Fatty Acids (FISH OIL) 1000 MG CAPS, Take 1 capsule (1,000 mg total) by mouth 3 (three) times daily. .  potassium chloride SA (KLOR-CON) 20 MEQ tablet, Take 1 tablet (20 mEq total) by mouth 3 (three) times daily. Marland Kitchen  topiramate (TOPAMAX) 50 MG tablet, Take 1 tablet (50 mg total) by mouth 2 (two) times daily. .  valACYclovir (VALTREX) 500 MG tablet, Take two tablets by mouth twice a day at onset of symptoms. Marland Kitchen  venlafaxine XR (EFFEXOR-XR) 150 MG 24 hr capsule, Take 1 capsule (150 mg total) by mouth  daily with breakfast. .  triamcinolone ointment (KENALOG) 0.5 %, Apply 1 application topically 2 (two) times daily. Apply to rash 2 x /day  (Do not use on face)    Allergies  Allergen Reactions  . Codeine Other (See Comments)    Abdominal pain  . Erythromycin Base     GI upset  . Penicillins Rash    Problem list She has Essential hypertension; Hyperlipidemia, mixed; Depression, major, recurrent, in partial remission ([place]); Vitamin D deficiency; Hypothyroidism; Medication management; Gastroesophageal reflux disease; Smoker; Extrinsic asthma; Body mass index (BMI) of 23.0-23.9 in adult; Severe recurrent major depression without psychotic features ([place]); Abnormal glucose;  Screening for ischemic heart disease; Attention deficit hyperactivity disorder (ADHD); and COPD (chronic obstructive pulmonary disease) ([facility]) on their problem list.   Observations/Objective:  BP 106/60   Pulse 76   Temp (!) 97.4 F (36.3 C)   Resp 16   Ht 5\' 4"  (1.626 m)   Wt 146 lb 3.2 oz (66.3 kg)   BMI 25.10 kg/m   HEENT - WNL. Neck - supple.  Chest - Clear equal BS. Cor - Nl HS. RRR w/o sig M MS- FROM w/o deformities.  Gait Nl. Neuro -  Nl w/o focal abnormalities. Skin - 1 small patch approx 1.5-2 cm of  scaly  slightly erythematous reddish rash on each arm and similiar Patch on upper back .   Assessment and Plan:  1. Eczema, unspecified type  - triamcinolone ointment (KENALOG) 0.5 %; Apply 1 application topically 2  times daily. Apply to rash 2 x /day  (Do not use on face)  Dispense: 30 g; Refill: 3  2. Hypothyroidism  - TSH  3. Medication management  - TSH  I discussed the assessment and treatment plan with the patient. The patient was provided an opportunity to ask questions and all were answered. The patient agreed with the plan and demonstrated an understanding of the instructions.  Kirtland Bouchard, MD

## 2019-12-01 ENCOUNTER — Ambulatory Visit (INDEPENDENT_AMBULATORY_CARE_PROVIDER_SITE_OTHER): Payer: Medicare Other | Admitting: Internal Medicine

## 2019-12-01 ENCOUNTER — Other Ambulatory Visit: Payer: Self-pay

## 2019-12-01 ENCOUNTER — Encounter: Payer: Self-pay | Admitting: Internal Medicine

## 2019-12-01 VITALS — BP 106/60 | HR 76 | Temp 97.4°F | Resp 16 | Ht 64.0 in | Wt 146.2 lb

## 2019-12-01 DIAGNOSIS — Z79899 Other long term (current) drug therapy: Secondary | ICD-10-CM

## 2019-12-01 DIAGNOSIS — L309 Dermatitis, unspecified: Secondary | ICD-10-CM

## 2019-12-01 DIAGNOSIS — E079 Disorder of thyroid, unspecified: Secondary | ICD-10-CM

## 2019-12-01 MED ORDER — TRIAMCINOLONE ACETONIDE 0.5 % EX OINT: 1.0000 "application " | TOPICAL_OINTMENT | Freq: Two times a day (BID) | CUTANEOUS | 3 refills | Status: DC

## 2019-12-02 LAB — TSH: TSH: 2.14 mIU/L (ref 0.40–4.50)

## 2019-12-08 NOTE — Progress Notes (Deleted)
MEDICARE ANNUAL WELLNESS VISIT AND FOLLOW UP  Assessment:   Earldean was seen today for medicare wellness.  Diagnoses and all orders for this visit:  Encounter for Medicare annual wellness exam Yearly Essential hypertension Discussed dietary and exercise modifications -     CBC with Differential/Platelet -     COMPLETE METABOLIC PANEL WITH GFR -     Urinalysis w microscopic + reflex cultur  Extrinsic asthma without complication, unspecified asthma severity, unspecified whether persistent Has rescue inhaler, has not had to use this recently, in date. Continue Breo daily  Gastroesophageal reflux disease, esophagitis presence not specified Continue zantac with benefit -     Magnesium  Hypothyroidism Continue same dose -     TSH  Attention deficit hyperactivity disorder (ADHD), unspecified ADHD type Taking ritalin Doing well with this  Depression, major, recurrent, in partial remission (HCC) Taking Wellbutrin and venlafaxine daily Continue with benefit  Hyperlipidemia, mixed Taking Lipitor 80mg , half tablet daily -     Lipid panel  Severe recurrent major depression without psychotic features ([place]) Continue follow up with this Continue current medication regiment  Herpes simplex -     valACYclovir (VALTREX) 500 MG tablet; Take two tablets by mouth twice a day at onset of symptoms. Asymptomatic at this time  Vitamin D deficiency -     VITAMIN D 25 Hydroxy (Vit-D Deficiency, Fractures)  Abnormal glucose Discussed dietary and exercise modifications -     Hemoglobin A1c -     Insulin, random  Smoker Discussed smoking cessation and resources Not ready to quit at this time Will continue to assess readiness   Body mass index (BMI) of 23.0-23.9 in adult Discussed dietary and exercise modifications  Need for vaccination against Streptococcus pneumoniae using pneumococcal conjugate vaccine 13 -     Pneumococcal conjugate vaccine 13-valent  Medication management -      CBC with Differential/Platelet -     COMPLETE METABOLIC PANEL WITH GFR -     Magnesium -     Lipid panel -     TSH -     Hemoglobin A1c -     Insulin, random -     VITAMIN D 25 Hydroxy (Vit-D Deficiency, Fractures) -     Urinalysis w microscopic + reflex cultur  Abnormal urine finding -     REFLEXIVE URINE CULTURE    Over 40 minutes of exam, counseling, chart review and critical decision making was performed Future Appointments  Date Time Provider [place]  12/09/2019 10:00 AM Vicie Mutters, PA-C [facility] None  01/08/2020  2:30 PM Unk Pinto, MD [facility] None  07/28/2020  2:00 PM Unk Pinto, MD [facility] None     Plan:   During the course of the visit the patient was educated and counseled about appropriate screening and preventive services including:    Pneumococcal vaccine   Prevnar 13  Influenza vaccine  Td vaccine  Screening electrocardiogram  Bone densitometry screening  Colorectal cancer screening  Diabetes screening  Glaucoma screening  Nutrition counseling   Advanced directives: requested   Subjective:  Margaret Nelson is a 69 y.o. female who presents for Medicare Annual Wellness Visit and 3 month follow up.   This very nice 69 y.o. DWF presents for an AWV and 3 month follow up for management of multiple medical co-morbidities.  Patient has been followed for HTN, HLD, Prediabetes  and Vitamin D Deficiency. Patient has GERD controlled on her meds. Patient has been on thyroid replacement for many years. Reports  she is doing well since last visit.  She has no health concerns although she would like a refill for valtrex related to intermittent flare ups, usually related to stress.                                         Patient is on SS disability for a long hx/o severe Depression circa 1990's w/hx/o ECT x 9 in 2008. She was seeing Dr Jeneen Montgomery.  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  She reports that this is chronic and remains in this situation.   Reports she has been putting on weight continuously.  She has gained 11 pounds in approximately 7 months. She has not been monitoring her diet or exercising.                             She has had elevated blood pressure for 12 years, since 2008. Her blood pressure has been controlled at home, today their BP is   She does not workout. She denies chest pain, shortness of breath, dizziness.  She is on cholesterol medication and denies myalgias. Her cholesterol is not at goal. The cholesterol last visit was:   Lab Results  Component Value Date   CHOL 135 10/06/2019   HDL 54 10/06/2019   LDLCALC 59 10/06/2019   TRIG 132 10/06/2019   CHOLHDL 2.5 10/06/2019   She has had pre diabetes with elevated A1c of 5.7 in 2012 and highest at 5.9 in 2016. She has not been working on diet and exercise for prediabetes/abnormal glucose, and denies hyperglycemia, hypoglycemia , nausea, polydipsia and polyuria. Last A1C in the office was:  Lab Results  Component Value Date   HGBA1C 5.5 07/02/2019   Last GFR:   Lab Results  Component Value Date   GFRNONAA 78 10/06/2019   Lab Results  Component Value Date   GFRAA 90 10/06/2019   Patient is on Vitamin D supplement.   Lab Results  Component Value Date   VD25OH 69 10/06/2019      Medication Review: Current Outpatient Medications on File Prior to Visit  Medication Sig Dispense Refill  . albuterol (PROAIR HFA) 108 (90 Base) MCG/ACT inhaler inhale 2 puffs by mouth every 6 hours if needed for wheezing or shortness of breath 8.5 g 2  . ALPRAZolam (XANAX) 1 MG tablet take 1/2 to 1 tablets by daily for anxiety 120 tablet 0  . atorvastatin (LIPITOR) 80 MG tablet TAKE 1/2 TO 1 TABLET BY MOUTH EVERY DAY AS NEEDED FOR CHOLESTEROL 90 tablet 0  . benzonatate (TESSALON) 200 MG capsule Take 1 capsule (200 mg  total) by mouth 3 (three) times daily as needed for cough (Max: 600mg  per day). 30 capsule 0  . bimatoprost (LATISSE) 0.03 % ophthalmic solution 1 drop on applicator, apply evenly along the skin of the upper eyelid at base of eyelashes at bedtime; repeat procedure for second eye 3 mL 12  . bumetanide (BUMEX) 2 MG tablet Take 1 tablet 2 x /day for BP & Fluid Retention / Ankle Swelling 180 tablet 3  . buPROPion (WELLBUTRIN XL) 300 MG 24 hr tablet take 1 tablet by mouth once daily (Patient taking differently: Take 150 mg by mouth 3 (three) times  daily. ) 90 tablet 1  . Cholecalciferol (VITAMIN D PO) Take 5,000 Units by mouth daily.    Marland Kitchen esomeprazole (NEXIUM) 20 MG capsule Take 1 capsule every morning for Indigestion & GERD 90 capsule 0  . famotidine (PEPCID) 40 MG tablet Take 1 tablet at Bedtime for GERD 90 tablet 0  . Flaxseed, Linseed, (FLAX SEED OIL) 1300 MG CAPS Take 1,300 mg by mouth 3 (three) times daily.    . fluticasone furoate-vilanterol (BREO ELLIPTA) 200-25 MCG/INH AEPB Inhale 1 puff into the lungs daily. 60 each 11  . levothyroxine (SYNTHROID) 50 MCG tablet Take 1.5  tablet daily on an empty stomach with only water for 30 minutes & no Antacid meds, Calcium or Magnesium for 4 hours & avoid Biotin 135 tablet 3  . methylphenidate (RITALIN) 20 MG tablet Take 1/2-1 tablet 2  x /day as directed for depression.  Do not take more than 5 days per week . Should last longer than 30 days. 60 tablet 0  . Multiple Vitamin (MULTIVITAMIN) tablet Take 1 tablet by mouth daily.    . Omega-3 Fatty Acids (FISH OIL) 1000 MG CAPS Take 1 capsule (1,000 mg total) by mouth 3 (three) times daily.  0  . potassium chloride SA (KLOR-CON) 20 MEQ tablet Take 1 tablet (20 mEq total) by mouth 3 (three) times daily. 270 tablet 1  . topiramate (TOPAMAX) 50 MG tablet Take 1 tablet (50 mg total) by mouth 2 (two) times daily. 180 tablet 1  . triamcinolone ointment (KENALOG) 0.5 % Apply 1 application topically 2 (two) times daily.  Apply to rash 2 x /day  (Do not use on face) 30 g 3  . valACYclovir (VALTREX) 500 MG tablet Take two tablets by mouth twice a day at onset of symptoms. 30 tablet 3  . venlafaxine XR (EFFEXOR-XR) 150 MG 24 hr capsule Take 1 capsule (150 mg total) by mouth daily with breakfast. 90 capsule 1   No current facility-administered medications on file prior to visit.    Allergies  Allergen Reactions  . Codeine Other (See Comments)    Abdominal pain  . Erythromycin Base     GI upset  . Penicillins Rash    Current Problems (verified) Patient Active Problem List   Diagnosis Date Noted  . COPD (chronic obstructive pulmonary disease) ([place]) 07/01/2019  . Abnormal glucose 05/21/2018  . Screening for ischemic heart disease 05/21/2018  . Attention deficit hyperactivity disorder (ADHD) 05/21/2018  . Severe recurrent major depression without psychotic features ([place]) 09/17/2017    Class: Chronic  . Extrinsic asthma 07/29/2015  . Body mass index (BMI) of 23.0-23.9 in adult 07/29/2015  . Smoker 04/14/2015  . Gastroesophageal reflux disease 02/15/2015  . Medication management 02/12/2014  . Hypothyroidism   . Essential hypertension 08/18/2013  . Hyperlipidemia, mixed 08/18/2013  . Depression, major, recurrent, in partial remission ([place]) 08/18/2013  . Vitamin D deficiency 08/18/2013    Screening Tests Immunization History  Administered Date(s) Administered  . Influenza Split 06/22/2015  . Influenza, High Dose Seasonal PF 07/16/2018, 07/02/2019  . Influenza,inj,Quad PF,6+ Mos 07/22/2017  . Influenza,inj,quad, With Preservative 07/05/2016  . Influenza-Unspecified 07/22/2017  . PFIZER SARS-COV-2 Vaccination 11/01/2019, 11/22/2019  . Pneumococcal Conjugate-13 12/03/2018  . Pneumococcal-Unspecified 09/25/2004  . Td 09/25/2004  . Tdap 12/01/2015    Preventative care: Last colonoscopy: 2015 Last mammogram: 2011, DUE Last pap smear/pelvic exam: 2016   DEXA:DUE  Prior vaccinations: TD or  Tdap: 2017  Influenza: 2019 Pneumococcal: 2006 Prevnar13: received today Shingles/Zostavax: DUE  Check with local pharmacy   Names of Other Physician/Practitioners you currently use: 1. [facility] here for primary care 2.Eye Exam, 2019 3. Dentist 2020  Patient Care Team: Unk Pinto, MD as PCP - General (Internal Medicine) Dian Queen, MD as Consulting Physician (Obstetrics and Gynecology) Milus Banister, MD as Attending Physician (Gastroenterology)  SURGICAL HISTORY She  has a past surgical history that includes Cholecystectomy (1975); Nasal sinus surgery (1996); Liposuction (1998); Breast surgery (Bilateral, 1983); Breast reconstruction (2008); Appendectomy; bil breast lumpectomy; bilateral breast masectomy (no breast cancer); and EUS (N/A, 04/23/2014). FAMILY HISTORY Her family history includes ADD / ADHD in her son; Alcohol abuse in her father and paternal uncle; Anxiety disorder in her sister; COPD in her mother; Dementia in her mother; Depression in her mother and sister; Diabetes in her sister; Early death in her father and sister; Emphysema in her mother; Lung cancer in her mother; Schizophrenia in her sister. SOCIAL HISTORY She  reports that she has been smoking e-cigarettes. She has been smoking about 0.50 packs per day. She has never used smokeless tobacco. She reports current alcohol use of about 4.0 - 5.0 standard drinks of alcohol per week. She reports that she does not use drugs.   MEDICARE WELLNESS OBJECTIVES: Physical activity:   Cardiac risk factors:   Depression/mood screen:   Depression screen [facility] 2/9 12/01/2019  Decreased Interest 0  Down, Depressed, Hopeless 0  PHQ - 2 Score 0  Altered sleeping -  Tired, decreased energy -  Change in appetite -  Feeling bad or failure about yourself  -  Trouble concentrating -  Moving slowly or fidgety/restless -  Suicidal thoughts -  PHQ-9 Score -  Difficult doing  work/chores -  Some encounter information is confidential and restricted. Go to Review Flowsheets activity to see all data.  Some recent data might be hidden    ADLs:  In your present state of health, do you have any difficulty performing the following activities: 06/24/2019  Hearing? N  Vision? N  Difficulty concentrating or making decisions? N  Walking or climbing stairs? N  Dressing or bathing? N  Doing errands, shopping? N  Some recent data might be hidden     Cognitive Testing  Alert? Yes  Normal Appearance?Yes  Oriented to person? Yes  Place? Yes   Time? Yes  Recall of three objects?  Yes  Can perform simple calculations? Yes  Displays appropriate judgment?Yes  Can read the correct time from a watch face?Yes  EOL planning:    Review of Systems  Constitutional: Negative for chills, diaphoresis, fever, malaise/fatigue and weight loss.  HENT: Negative for congestion, ear discharge, ear pain, hearing loss, nosebleeds, sinus pain, sore throat and tinnitus.   Eyes: Negative for blurred vision, double vision, photophobia, pain, discharge and redness.  Respiratory: Negative for cough, hemoptysis, sputum production, shortness of breath, wheezing and stridor.   Cardiovascular: Negative for chest pain, palpitations, orthopnea, claudication, leg swelling and PND.  Gastrointestinal: Negative for abdominal pain, blood in stool, constipation, diarrhea, heartburn, melena, nausea and vomiting.  Genitourinary: Negative for dysuria, flank pain, frequency, hematuria and urgency.  Musculoskeletal: Negative for back pain, falls, joint pain, myalgias and neck pain.  Skin: Negative for itching and rash.  Neurological: Negative for dizziness, tingling, tremors, sensory change, speech change, focal weakness, seizures, loss of consciousness, weakness and headaches.  Endo/Heme/Allergies: Negative for environmental allergies and polydipsia. Does not bruise/bleed easily.  Psychiatric/Behavioral:  Negative for depression, hallucinations, memory loss, substance  abuse and suicidal ideas. The patient is not nervous/anxious and does not have insomnia.      Objective:     There were no vitals filed for this visit. There is no height or weight on file to calculate BMI.  General appearance: alert, no distress, WD/WN, female HEENT: normocephalic, sclerae anicteric, TMs pearly, nares patent, no discharge or erythema, pharynx normal Oral cavity: MMM, no lesions Neck: supple, no lymphadenopathy, no thyromegaly, no masses Heart: RRR, normal S1, S2, no murmurs Lungs: CTA bilaterally, no wheezes, rhonchi, or rales Abdomen: +bs, soft, non tender, non distended, no masses, no hepatomegaly, no splenomegaly Musculoskeletal: nontender, no swelling, no obvious deformity Extremities: no edema, no cyanosis, no clubbing Pulses: 2+ symmetric, upper and lower extremities, normal cap refill Neurological: alert, oriented x 3, CN2-12 intact, strength normal upper extremities and lower extremities, sensation normal throughout, DTRs 2+ throughout, no cerebellar signs, gait normal Psychiatric: normal affect, behavior normal, pleasant   Medicare Attestation I have personally reviewed: The patient's medical and social history Their use of alcohol, tobacco or illicit drugs Their current medications and supplements The patient's functional ability including ADLs,fall risks, home safety risks, cognitive, and hearing and visual impairment Diet and physical activities Evidence for depression or mood disorders  The patient's weight, height, BMI, and visual acuity have been recorded in the chart.  I have made referrals, counseling, and provided education to the patient based on review of the above and I have provided the patient with a written personalized care plan for preventive services.     Vicie Mutters, PA-C   12/08/2019

## 2019-12-09 ENCOUNTER — Ambulatory Visit: Payer: Medicare Other | Admitting: Physician Assistant

## 2019-12-09 NOTE — Progress Notes (Deleted)
MEDICARE ANNUAL WELLNESS VISIT AND FOLLOW UP  Assessment:   Margaret Nelson was seen today for medicare wellness.  Diagnoses and all orders for this visit:  Encounter for Medicare annual wellness exam Yearly Essential hypertension Discussed dietary and exercise modifications -     CBC with Differential/Platelet -     COMPLETE METABOLIC PANEL WITH GFR -     Urinalysis w microscopic + reflex cultur  Extrinsic asthma without complication, unspecified asthma severity, unspecified whether persistent Has rescue inhaler, has not had to use this recently, in date. Continue Breo daily  Gastroesophageal reflux disease, esophagitis presence not specified Continue zantac with benefit -     Magnesium  Hypothyroidism Continue same dose -     TSH  Attention deficit hyperactivity disorder (ADHD), unspecified ADHD type Taking ritalin Doing well with this  Depression, major, recurrent, in partial remission (HCC) Taking Wellbutrin and venlafaxine daily Continue with benefit  Hyperlipidemia, mixed Taking Lipitor 80mg , half tablet daily -     Lipid panel  Severe recurrent major depression without psychotic features ([place]) Continue follow up with this Continue current medication regiment  Herpes simplex -     valACYclovir (VALTREX) 500 MG tablet; Take two tablets by mouth twice a day at onset of symptoms. Asymptomatic at this time  Vitamin D deficiency -     VITAMIN D 25 Hydroxy (Vit-D Deficiency, Fractures)  Abnormal glucose Discussed dietary and exercise modifications -     Hemoglobin A1c -     Insulin, random  Smoker Discussed smoking cessation and resources Not ready to quit at this time Will continue to assess readiness   Body mass index (BMI) of 23.0-23.9 in adult Discussed dietary and exercise modifications  Need for vaccination against Streptococcus pneumoniae using pneumococcal conjugate vaccine 13 -     Pneumococcal conjugate vaccine 13-valent  Medication management -      CBC with Differential/Platelet -     COMPLETE METABOLIC PANEL WITH GFR -     Magnesium -     Lipid panel -     TSH -     Hemoglobin A1c -     Insulin, random -     VITAMIN D 25 Hydroxy (Vit-D Deficiency, Fractures) -     Urinalysis w microscopic + reflex cultur  Abnormal urine finding -     REFLEXIVE URINE CULTURE    Over 40 minutes of exam, counseling, chart review and critical decision making was performed Future Appointments  Date Time Provider [place]  12/11/2019  2:45 PM Vicie Mutters, PA-C [facility] None  01/08/2020  2:30 PM Unk Pinto, MD [facility] None  07/28/2020  2:00 PM Unk Pinto, MD [facility] None     Plan:   During the course of the visit the patient was educated and counseled about appropriate screening and preventive services including:    Pneumococcal vaccine   Prevnar 13  Influenza vaccine  Td vaccine  Screening electrocardiogram  Bone densitometry screening  Colorectal cancer screening  Diabetes screening  Glaucoma screening  Nutrition counseling   Advanced directives: requested   Subjective:  Margaret Nelson is a 69 y.o. female who presents for Medicare Annual Wellness Visit and 3 month follow up.   This very nice 69 y.o. DWF presents for an AWV and 3 month follow up for management of multiple medical co-morbidities.  Patient has been followed for HTN, HLD, Prediabetes  and Vitamin D Deficiency. Patient has GERD controlled on her meds. Patient has been on thyroid replacement for many years.  Reports she is doing well since last visit.  She has no health concerns although she would like a refill for valtrex related to intermittent flare ups, usually related to stress.                                         Patient is on SS disability for a long hx/o severe Depression circa 1990's w/hx/o ECT x 9 in 2008. She was seeing Dr Jeneen Montgomery.  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  She reports that this is chronic and remains in this situation.   Reports she has been putting on weight continuously.  She has gained 11 pounds in approximately 7 months. She has not been monitoring her diet or exercising.                             She has had elevated blood pressure for 12 years, since 2008. Her blood pressure has been controlled at home, today their BP is   She does not workout. She denies chest pain, shortness of breath, dizziness.  She is on cholesterol medication and denies myalgias. Her cholesterol is not at goal. The cholesterol last visit was:   Lab Results  Component Value Date   CHOL 135 10/06/2019   HDL 54 10/06/2019   LDLCALC 59 10/06/2019   TRIG 132 10/06/2019   CHOLHDL 2.5 10/06/2019   She has had pre diabetes with elevated A1c of 5.7 in 2012 and highest at 5.9 in 2016. She has not been working on diet and exercise for prediabetes/abnormal glucose, and denies hyperglycemia, hypoglycemia , nausea, polydipsia and polyuria. Last A1C in the office was:  Lab Results  Component Value Date   HGBA1C 5.5 07/02/2019   Last GFR:   Lab Results  Component Value Date   GFRNONAA 78 10/06/2019   Lab Results  Component Value Date   GFRAA 90 10/06/2019   Patient is on Vitamin D supplement.   Lab Results  Component Value Date   VD25OH 69 10/06/2019      Medication Review: Current Outpatient Medications on File Prior to Visit  Medication Sig Dispense Refill  . albuterol (PROAIR HFA) 108 (90 Base) MCG/ACT inhaler inhale 2 puffs by mouth every 6 hours if needed for wheezing or shortness of breath 8.5 g 2  . ALPRAZolam (XANAX) 1 MG tablet take 1/2 to 1 tablets by daily for anxiety 120 tablet 0  . atorvastatin (LIPITOR) 80 MG tablet TAKE 1/2 TO 1 TABLET BY MOUTH EVERY DAY AS NEEDED FOR CHOLESTEROL 90 tablet 0  . benzonatate (TESSALON) 200 MG capsule Take 1 capsule (200 mg  total) by mouth 3 (three) times daily as needed for cough (Max: 600mg  per day). 30 capsule 0  . bimatoprost (LATISSE) 0.03 % ophthalmic solution 1 drop on applicator, apply evenly along the skin of the upper eyelid at base of eyelashes at bedtime; repeat procedure for second eye 3 mL 12  . bumetanide (BUMEX) 2 MG tablet Take 1 tablet 2 x /day for BP & Fluid Retention / Ankle Swelling 180 tablet 3  . buPROPion (WELLBUTRIN XL) 300 MG 24 hr tablet take 1 tablet by mouth once daily (Patient taking differently: Take 150 mg by mouth 3 (three)  times daily. ) 90 tablet 1  . Cholecalciferol (VITAMIN D PO) Take 5,000 Units by mouth daily.    Marland Kitchen esomeprazole (NEXIUM) 20 MG capsule Take 1 capsule every morning for Indigestion & GERD 90 capsule 0  . famotidine (PEPCID) 40 MG tablet Take 1 tablet at Bedtime for GERD 90 tablet 0  . Flaxseed, Linseed, (FLAX SEED OIL) 1300 MG CAPS Take 1,300 mg by mouth 3 (three) times daily.    . fluticasone furoate-vilanterol (BREO ELLIPTA) 200-25 MCG/INH AEPB Inhale 1 puff into the lungs daily. 60 each 11  . levothyroxine (SYNTHROID) 50 MCG tablet Take 1.5  tablet daily on an empty stomach with only water for 30 minutes & no Antacid meds, Calcium or Magnesium for 4 hours & avoid Biotin 135 tablet 3  . methylphenidate (RITALIN) 20 MG tablet Take 1/2-1 tablet 2  x /day as directed for depression.  Do not take more than 5 days per week . Should last longer than 30 days. 60 tablet 0  . Multiple Vitamin (MULTIVITAMIN) tablet Take 1 tablet by mouth daily.    . Omega-3 Fatty Acids (FISH OIL) 1000 MG CAPS Take 1 capsule (1,000 mg total) by mouth 3 (three) times daily.  0  . potassium chloride SA (KLOR-CON) 20 MEQ tablet Take 1 tablet (20 mEq total) by mouth 3 (three) times daily. 270 tablet 1  . topiramate (TOPAMAX) 50 MG tablet Take 1 tablet (50 mg total) by mouth 2 (two) times daily. 180 tablet 1  . triamcinolone ointment (KENALOG) 0.5 % Apply 1 application topically 2 (two) times daily.  Apply to rash 2 x /day  (Do not use on face) 30 g 3  . valACYclovir (VALTREX) 500 MG tablet Take two tablets by mouth twice a day at onset of symptoms. 30 tablet 3  . venlafaxine XR (EFFEXOR-XR) 150 MG 24 hr capsule Take 1 capsule (150 mg total) by mouth daily with breakfast. 90 capsule 1   No current facility-administered medications on file prior to visit.    Allergies  Allergen Reactions  . Codeine Other (See Comments)    Abdominal pain  . Erythromycin Base     GI upset  . Penicillins Rash    Current Problems (verified) Patient Active Problem List   Diagnosis Date Noted  . COPD (chronic obstructive pulmonary disease) ([place]) 07/01/2019  . Abnormal glucose 05/21/2018  . Screening for ischemic heart disease 05/21/2018  . Attention deficit hyperactivity disorder (ADHD) 05/21/2018  . Severe recurrent major depression without psychotic features ([place]) 09/17/2017    Class: Chronic  . Extrinsic asthma 07/29/2015  . Body mass index (BMI) of 23.0-23.9 in adult 07/29/2015  . Smoker 04/14/2015  . Gastroesophageal reflux disease 02/15/2015  . Medication management 02/12/2014  . Hypothyroidism   . Essential hypertension 08/18/2013  . Hyperlipidemia, mixed 08/18/2013  . Depression, major, recurrent, in partial remission ([place]) 08/18/2013  . Vitamin D deficiency 08/18/2013    Screening Tests Immunization History  Administered Date(s) Administered  . Influenza Split 06/22/2015  . Influenza, High Dose Seasonal PF 07/16/2018, 07/02/2019  . Influenza,inj,Quad PF,6+ Mos 07/22/2017  . Influenza,inj,quad, With Preservative 07/05/2016  . Influenza-Unspecified 07/22/2017  . PFIZER SARS-COV-2 Vaccination 11/01/2019, 11/22/2019  . Pneumococcal Conjugate-13 12/03/2018  . Pneumococcal-Unspecified 09/25/2004  . Td 09/25/2004  . Tdap 12/01/2015    Preventative care: Last colonoscopy: 2015 Last mammogram: 2011, DUE Last pap smear/pelvic exam: 2016   DEXA:DUE  Prior vaccinations: TD or  Tdap: 2017  Influenza: 2019 Pneumococcal: 2006 Prevnar13: received today Shingles/Zostavax:  DUE  Check with local pharmacy   Names of Other Physician/Practitioners you currently use: 1. [facility] here for primary care 2.Eye Exam, 2019 3. Dentist 2020  Patient Care Team: Unk Pinto, MD as PCP - General (Internal Medicine) Dian Queen, MD as Consulting Physician (Obstetrics and Gynecology) Milus Banister, MD as Attending Physician (Gastroenterology)  SURGICAL HISTORY She  has a past surgical history that includes Cholecystectomy (1975); Nasal sinus surgery (1996); Liposuction (1998); Breast surgery (Bilateral, 1983); Breast reconstruction (2008); Appendectomy; bil breast lumpectomy; bilateral breast masectomy (no breast cancer); and EUS (N/A, 04/23/2014). FAMILY HISTORY Her family history includes ADD / ADHD in her son; Alcohol abuse in her father and paternal uncle; Anxiety disorder in her sister; COPD in her mother; Dementia in her mother; Depression in her mother and sister; Diabetes in her sister; Early death in her father and sister; Emphysema in her mother; Lung cancer in her mother; Schizophrenia in her sister. SOCIAL HISTORY She  reports that she has been smoking e-cigarettes. She has been smoking about 0.50 packs per day. She has never used smokeless tobacco. She reports current alcohol use of about 4.0 - 5.0 standard drinks of alcohol per week. She reports that she does not use drugs.   MEDICARE WELLNESS OBJECTIVES: Physical activity:   Cardiac risk factors:   Depression/mood screen:   Depression screen [facility] 2/9 12/01/2019  Decreased Interest 0  Down, Depressed, Hopeless 0  PHQ - 2 Score 0  Altered sleeping -  Tired, decreased energy -  Change in appetite -  Feeling bad or failure about yourself  -  Trouble concentrating -  Moving slowly or fidgety/restless -  Suicidal thoughts -  PHQ-9 Score -  Difficult doing  work/chores -  Some encounter information is confidential and restricted. Go to Review Flowsheets activity to see all data.  Some recent data might be hidden    ADLs:  In your present state of health, do you have any difficulty performing the following activities: 06/24/2019  Hearing? N  Vision? N  Difficulty concentrating or making decisions? N  Walking or climbing stairs? N  Dressing or bathing? N  Doing errands, shopping? N  Some recent data might be hidden     Cognitive Testing  Alert? Yes  Normal Appearance?Yes  Oriented to person? Yes  Place? Yes   Time? Yes  Recall of three objects?  Yes  Can perform simple calculations? Yes  Displays appropriate judgment?Yes  Can read the correct time from a watch face?Yes  EOL planning:    Review of Systems  Constitutional: Negative for chills, diaphoresis, fever, malaise/fatigue and weight loss.  HENT: Negative for congestion, ear discharge, ear pain, hearing loss, nosebleeds, sinus pain, sore throat and tinnitus.   Eyes: Negative for blurred vision, double vision, photophobia, pain, discharge and redness.  Respiratory: Negative for cough, hemoptysis, sputum production, shortness of breath, wheezing and stridor.   Cardiovascular: Negative for chest pain, palpitations, orthopnea, claudication, leg swelling and PND.  Gastrointestinal: Negative for abdominal pain, blood in stool, constipation, diarrhea, heartburn, melena, nausea and vomiting.  Genitourinary: Negative for dysuria, flank pain, frequency, hematuria and urgency.  Musculoskeletal: Negative for back pain, falls, joint pain, myalgias and neck pain.  Skin: Negative for itching and rash.  Neurological: Negative for dizziness, tingling, tremors, sensory change, speech change, focal weakness, seizures, loss of consciousness, weakness and headaches.  Endo/Heme/Allergies: Negative for environmental allergies and polydipsia. Does not bruise/bleed easily.  Psychiatric/Behavioral:  Negative for depression, hallucinations, memory  loss, substance abuse and suicidal ideas. The patient is not nervous/anxious and does not have insomnia.      Objective:     There were no vitals filed for this visit. There is no height or weight on file to calculate BMI.  General appearance: alert, no distress, WD/WN, female HEENT: normocephalic, sclerae anicteric, TMs pearly, nares patent, no discharge or erythema, pharynx normal Oral cavity: MMM, no lesions Neck: supple, no lymphadenopathy, no thyromegaly, no masses Heart: RRR, normal S1, S2, no murmurs Lungs: CTA bilaterally, no wheezes, rhonchi, or rales Abdomen: +bs, soft, non tender, non distended, no masses, no hepatomegaly, no splenomegaly Musculoskeletal: nontender, no swelling, no obvious deformity Extremities: no edema, no cyanosis, no clubbing Pulses: 2+ symmetric, upper and lower extremities, normal cap refill Neurological: alert, oriented x 3, CN2-12 intact, strength normal upper extremities and lower extremities, sensation normal throughout, DTRs 2+ throughout, no cerebellar signs, gait normal Psychiatric: normal affect, behavior normal, pleasant   Medicare Attestation I have personally reviewed: The patient's medical and social history Their use of alcohol, tobacco or illicit drugs Their current medications and supplements The patient's functional ability including ADLs,fall risks, home safety risks, cognitive, and hearing and visual impairment Diet and physical activities Evidence for depression or mood disorders  The patient's weight, height, BMI, and visual acuity have been recorded in the chart.  I have made referrals, counseling, and provided education to the patient based on review of the above and I have provided the patient with a written personalized care plan for preventive services.     Vicie Mutters, PA-C   12/09/2019

## 2019-12-11 ENCOUNTER — Ambulatory Visit: Payer: Medicare Other | Admitting: Physician Assistant

## 2019-12-22 ENCOUNTER — Other Ambulatory Visit: Payer: Self-pay | Admitting: Internal Medicine

## 2019-12-22 DIAGNOSIS — F909 Attention-deficit hyperactivity disorder, unspecified type: Secondary | ICD-10-CM

## 2019-12-22 MED ORDER — METHYLPHENIDATE HCL 20 MG PO TABS: ORAL_TABLET | ORAL | 0 refills | Status: DC

## 2019-12-25 DIAGNOSIS — F330 Major depressive disorder, recurrent, mild: Secondary | ICD-10-CM | POA: Diagnosis not present

## 2019-12-31 ENCOUNTER — Other Ambulatory Visit: Payer: Self-pay | Admitting: Physician Assistant

## 2019-12-31 DIAGNOSIS — F909 Attention-deficit hyperactivity disorder, unspecified type: Secondary | ICD-10-CM

## 2019-12-31 NOTE — Progress Notes (Unsigned)
Future Appointments  Date Time Provider [place]  01/06/2020  3:30 PM Unk Pinto, MD [facility] None  07/28/2020  2:00 PM Unk Pinto, MD [facility] None

## 2020-01-06 ENCOUNTER — Ambulatory Visit (INDEPENDENT_AMBULATORY_CARE_PROVIDER_SITE_OTHER): Payer: Medicare Other | Admitting: Internal Medicine

## 2020-01-06 ENCOUNTER — Other Ambulatory Visit: Payer: Self-pay

## 2020-01-06 VITALS — BP 110/62 | HR 84 | Temp 97.1°F | Resp 16 | Ht 64.0 in | Wt 142.6 lb

## 2020-01-06 DIAGNOSIS — F3341 Major depressive disorder, recurrent, in partial remission: Secondary | ICD-10-CM

## 2020-01-06 DIAGNOSIS — R7303 Prediabetes: Secondary | ICD-10-CM | POA: Diagnosis not present

## 2020-01-06 DIAGNOSIS — R7309 Other abnormal glucose: Secondary | ICD-10-CM | POA: Diagnosis not present

## 2020-01-06 DIAGNOSIS — E782 Mixed hyperlipidemia: Secondary | ICD-10-CM | POA: Diagnosis not present

## 2020-01-06 DIAGNOSIS — E559 Vitamin D deficiency, unspecified: Secondary | ICD-10-CM | POA: Diagnosis not present

## 2020-01-06 DIAGNOSIS — E079 Disorder of thyroid, unspecified: Secondary | ICD-10-CM

## 2020-01-06 DIAGNOSIS — Z79899 Other long term (current) drug therapy: Secondary | ICD-10-CM | POA: Diagnosis not present

## 2020-01-06 DIAGNOSIS — I10 Essential (primary) hypertension: Secondary | ICD-10-CM | POA: Diagnosis not present

## 2020-01-06 MED ORDER — TOPIRAMATE 100 MG PO TABS: ORAL_TABLET | ORAL | 1 refills | Status: DC

## 2020-01-06 NOTE — Progress Notes (Signed)
History of Present Illness:       This very nice 69 y.o. DWF presents for 6 month follow up with HTN, HLD, Pre-Diabetes and Vitamin D Deficiency.       Patient is treated for HTN (2008) & BP has been controlled at home. Today's BP is at goal - 110/62. Patient has had no complaints of any cardiac type chest pain, palpitations, dyspnea / orthopnea / PND, dizziness, claudication, or dependent edema.      Hyperlipidemia is controlled with diet & meds. Patient denies myalgias or other med SE's. Last Lipids were at goal:  Lab Results  Component Value Date   CHOL 135 10/06/2019   HDL 54 10/06/2019   LDLCALC 59 10/06/2019   TRIG 132 10/06/2019   CHOLHDL 2.5 10/06/2019    Also, the patient has history of  PreDiabetes  (A1c 5.7% / 2012)  and has had no symptoms of reactive hypoglycemia, diabetic polys, paresthesias or visual blurring.  Last A1c was at goal:  Lab Results  Component Value Date   HGBA1C 5.5 07/02/2019           Further, the patient also has history of Vitamin D Deficiency("36" / 2008) and supplements vitamin D without any suspected side-effects. Last vitamin D was at goal:  Lab Results  Component Value Date   VD25OH 69 10/06/2019    Current Outpatient Medications on File Prior to Visit  Medication Sig  . albuterol (PROAIR HFA) 108 (90 Base) MCG/ACT inhaler inhale 2 puffs by mouth every 6 hours if needed for wheezing or shortness of breath  . ALPRAZolam (XANAX) 1 MG tablet take 1/2 to 1 tablets by daily for anxiety  . atorvastatin (LIPITOR) 80 MG tablet TAKE 1/2 TO 1 TABLET BY MOUTH EVERY DAY AS NEEDED FOR CHOLESTEROL  . bimatoprost (LATISSE) 0.03 % ophthalmic solution 1 drop on applicator, apply evenly along the skin of the upper eyelid at base of eyelashes at bedtime; repeat procedure for second eye  . bumetanide (BUMEX) 2 MG tablet Take 1 tablet 2 x /day for BP & Fluid Retention / Ankle Swelling  . buPROPion (WELLBUTRIN XL) 300 MG 24 hr tablet take 1 tablet  by mouth once daily (Patient taking differently: Take 150 mg by mouth 3 (three) times daily. )  . Cholecalciferol (VITAMIN D PO) Take 5,000 Units by mouth daily.  Marland Kitchen esomeprazole (NEXIUM) 20 MG capsule Take 1 capsule every morning for Indigestion & GERD  . famotidine (PEPCID) 40 MG tablet Take 1 tablet at Bedtime for GERD  . Flaxseed, Linseed, (FLAX SEED OIL) 1300 MG CAPS Take 1,300 mg by mouth 3 (three) times daily.  Marland Kitchen levothyroxine (SYNTHROID) 50 MCG tablet Take 1.5  tablet daily on an empty stomach with only water for 30 minutes & no Antacid meds, Calcium or Magnesium for 4 hours & avoid Biotin  . methylphenidate (RITALIN) 20 MG tablet Take 1/2-1 tablet 2  x /day as directed for depression.  Do not take more than 5 days per week . Should last longer than 30 days.  . Multiple Vitamin (MULTIVITAMIN) tablet Take 1 tablet by mouth daily.  . Omega-3 Fatty Acids (FISH OIL) 1000 MG CAPS Take 1 capsule (1,000 mg total) by mouth 3 (three) times daily.  . potassium chloride SA (KLOR-CON) 20 MEQ tablet Take 1 tablet (20 mEq total) by mouth 3 (three) times daily.  Marland Kitchen topiramate (TOPAMAX) 50 MG tablet Take 1 tablet (50 mg total) by mouth 2 (two) times  daily.  . triamcinolone ointment (KENALOG) 0.5 % Apply 1 application topically 2 (two) times daily. Apply to rash 2 x /day  (Do not use on face)  . valACYclovir (VALTREX) 500 MG tablet Take two tablets by mouth twice a day at onset of symptoms.  Marland Kitchen venlafaxine XR (EFFEXOR-XR) 150 MG 24 hr capsule Take 1 capsule (150 mg total) by mouth daily with breakfast.   No current facility-administered medications on file prior to visit.    Allergies  Allergen Reactions  . Codeine Other (See Comments)    Abdominal pain  . Erythromycin Base     GI upset  . Penicillins Rash    PMHx:   Past Medical History:  Diagnosis Date  . Allergy   . Anxiety   . Arthritis   . Asthma    as a child, out grew   . Blood transfusion without reported diagnosis   . Cancer ([place])      basal cell left temple, left side of nose  . Depression   . GERD (gastroesophageal reflux disease)   . Hyperlipidemia   . Hypertension   . Thyroid disease    hypo  . Tubular adenoma of colon     Immunization History  Administered Date(s) Administered  . Influenza Split 06/22/2015  . Influenza, High Dose Seasonal PF 07/16/2018, 07/02/2019  . Influenza,inj,Quad PF,6+ Mos 07/22/2017  . Influenza,inj,quad, With Preservative 07/05/2016  . Influenza-Unspecified 07/22/2017  . PFIZER SARS-COV-2 Vaccination 11/01/2019, 11/22/2019  . Pneumococcal Conjugate-13 12/03/2018  . Pneumococcal-Unspecified 09/25/2004  . Td 09/25/2004  . Tdap 12/01/2015    Past Surgical History:  Procedure Laterality Date  . APPENDECTOMY    . bil breast lumpectomy    . bilateral breast masectomy  no breast cancer   bil breast removed to prevent ca.  Marland Kitchen BREAST RECONSTRUCTION  2008  . BREAST SURGERY Bilateral 1983   implants  . CHOLECYSTECTOMY  1975  . EUS N/A 04/23/2014   Procedure: LOWER ENDOSCOPIC ULTRASOUND (EUS);  Surgeon: Milus Banister, MD;  Location: [facility];  Service: Endoscopy;  Laterality: N/A;  . LIPOSUCTION  1998   with tummy tuck  . NASAL SINUS SURGERY  1996    FHx:    Reviewed / unchanged  SHx:    Reviewed / unchanged   Systems Review:  Constitutional: Denies fever, chills, wt changes, headaches, insomnia, fatigue, night sweats, change in appetite. Eyes: Denies redness, blurred vision, diplopia, discharge, itchy, watery eyes.  ENT: Denies discharge, congestion, post nasal drip, epistaxis, sore throat, earache, hearing loss, dental pain, tinnitus, vertigo, sinus pain, snoring.  CV: Denies chest pain, palpitations, irregular heartbeat, syncope, dyspnea, diaphoresis, orthopnea, PND, claudication or edema. Respiratory: denies cough, dyspnea, DOE, pleurisy, hoarseness, laryngitis, wheezing.  Gastrointestinal: Denies dysphagia, odynophagia, heartburn, reflux, water brash, abdominal pain  or cramps, nausea, vomiting, bloating, diarrhea, constipation, hematemesis, melena, hematochezia  or hemorrhoids. Genitourinary: Denies dysuria, frequency, urgency, nocturia, hesitancy, discharge, hematuria or flank pain. Musculoskeletal: Denies arthralgias, myalgias, stiffness, jt. swelling, pain, limping or strain/sprain.  Skin: Denies pruritus, rash, hives, warts, acne, eczema or change in skin lesion(s). Neuro: No weakness, tremor, incoordination, spasms, paresthesia or pain. Psychiatric: Denies confusion, memory loss or sensory loss. Endo: Denies change in weight, skin or hair change.  Heme/Lymph: No excessive bleeding, bruising or enlarged lymph nodes.  Physical Exam  BP 110/62   Pulse 84   Temp (!) 97.1 F (36.2 C)   Resp 16   Ht 5\' 4"  (1.626 m)   Wt 142 lb 9.6 oz (  64.7 kg)   BMI 24.48 kg/m   Appears  well nourished, well groomed  and in no distress.  Eyes: PERRLA, EOMs, conjunctiva no swelling or erythema. Sinuses: No frontal/maxillary tenderness ENT/Mouth: EAC's clear, TM's nl w/o erythema, bulging. Nares clear w/o erythema, swelling, exudates. Oropharynx clear without erythema or exudates. Oral hygiene is good. Tongue normal, non obstructing. Hearing intact.  Neck: Supple. Thyroid not palpable. Car 2+/2+ without bruits, nodes or JVD. Chest: Respirations nl with BS clear & equal w/o rales, rhonchi, wheezing or stridor.  Cor: Heart sounds normal w/ regular rate and rhythm without sig. murmurs, gallops, clicks or rubs. Peripheral pulses normal and equal  without edema.  Abdomen: Soft & bowel sounds normal. Non-tender w/o guarding, rebound, hernias, masses or organomegaly.  Lymphatics: Unremarkable.  Musculoskeletal: Full ROM all peripheral extremities, joint stability, 5/5 strength and normal gait.  Skin: Warm, dry without exposed rashes, lesions or ecchymosis apparent.  Neuro: Cranial nerves intact, reflexes equal bilaterally. Sensory-motor testing grossly intact. Tendon  reflexes grossly intact.  Pysch: Alert & oriented x 3.  Insight and judgement nl & appropriate. No ideations.  Assessment and Plan:  1. Essential hypertension  - Continue medication, monitor blood pressure at home.  - Continue DASH diet.  Reminder to go to the ER if any CP,  SOB, nausea, dizziness, severe HA, changes vision/speech.  - CBC with Differential/Platelet - COMPLETE METABOLIC PANEL WITH GFR - Magnesium - TSH  2. Hyperlipidemia, mixed  - Continue diet/meds, exercise,& lifestyle modifications.  - Continue monitor periodic cholesterol/liver & renal functions   - Lipid panel - TSH  3. Abnormal glucose  - Continue diet, exercise  - Lifestyle modifications.  - Monitor appropriate labs.  - Hemoglobin A1c - Insulin, random  4. Vitamin D deficiency  - Continue supplementation.  - VITAMIN D 25 Hydroxyl  5. Prediabetes  - Hemoglobin A1c - Insulin, random  6. Hypothyroidism  - TSH  7. Depression, major, recurrent, in partial remission ([place])   8. Medication management  - CBC with Differential/Platelet - COMPLETE METABOLIC PANEL WITH GFR - Magnesium - Lipid panel - TSH - Hemoglobin A1c - Insulin, random - VITAMIN D 25 Hydroxy         Discussed  regular exercise, BP monitoring, weight control to achieve/maintain BMI less than 25 and discussed med and SE's. Recommended labs to assess and monitor clinical status with further disposition pending results of labs.  I discussed the assessment and treatment plan with the patient. The patient was provided an opportunity to ask questions and all were answered. The patient agreed with the plan and demonstrated an understanding of the instructions.  I provided over 30 minutes of exam, counseling, chart review and  complex critical decision making.   Kirtland Bouchard, MD

## 2020-01-06 NOTE — Patient Instructions (Signed)

## 2020-01-07 ENCOUNTER — Other Ambulatory Visit: Payer: Self-pay | Admitting: Internal Medicine

## 2020-01-07 LAB — COMPLETE METABOLIC PANEL WITH GFR
AG Ratio: 1.6 (calc) (ref 1.0–2.5)
ALT: 19 U/L (ref 6–29)
AST: 17 U/L (ref 10–35)
Albumin: 4.2 g/dL (ref 3.6–5.1)
Alkaline phosphatase (APISO): 88 U/L (ref 37–153)
BUN: 16 mg/dL (ref 7–25)
CO2: 30 mmol/L (ref 20–32)
Calcium: 9.2 mg/dL (ref 8.6–10.4)
Chloride: 103 mmol/L (ref 98–110)
Creat: 0.86 mg/dL (ref 0.50–0.99)
GFR, Est African American: 81 mL/min/{1.73_m2} (ref >=60)
GFR, Est Non African American: 70 mL/min/{1.73_m2} (ref >=60)
Globulin: 2.6 g/dL (calc) (ref 1.9–3.7)
Glucose, Bld: 84 mg/dL (ref 65–99)
Potassium: 4 mmol/L (ref 3.5–5.3)
Sodium: 140 mmol/L (ref 135–146)
Total Bilirubin: 0.4 mg/dL (ref 0.2–1.2)
Total Protein: 6.8 g/dL (ref 6.1–8.1)

## 2020-01-07 LAB — CBC WITH DIFFERENTIAL/PLATELET
Absolute Monocytes: 454 cells/uL (ref 200–950)
Basophils Absolute: 72 cells/uL (ref 0–200)
Basophils Relative: 1 %
Eosinophils Absolute: 144 cells/uL (ref 15–500)
Eosinophils Relative: 2 %
HCT: 37.1 % (ref 35.0–45.0)
Hemoglobin: 12 g/dL (ref 11.7–15.5)
Lymphs Abs: 3262 cells/uL (ref 850–3900)
MCH: 30.5 pg (ref 27.0–33.0)
MCHC: 32.3 g/dL (ref 32.0–36.0)
MCV: 94.2 fL (ref 80.0–100.0)
MPV: 10.7 fL (ref 7.5–12.5)
Monocytes Relative: 6.3 %
Neutro Abs: 3269 cells/uL (ref 1500–7800)
Neutrophils Relative %: 45.4 %
Platelets: 317 10*3/uL (ref 140–400)
RBC: 3.94 10*6/uL (ref 3.80–5.10)
RDW: 13.4 % (ref 11.0–15.0)
Total Lymphocyte: 45.3 %
WBC: 7.2 10*3/uL (ref 3.8–10.8)

## 2020-01-07 LAB — LIPID PANEL
Cholesterol: 150 mg/dL (ref <200)
HDL: 53 mg/dL (ref >=50)
LDL Cholesterol (Calc): 72 mg/dL (calc)
Non-HDL Cholesterol (Calc): 97 mg/dL (calc) (ref <130)
Total CHOL/HDL Ratio: 2.8 (calc) (ref <5.0)
Triglycerides: 170 mg/dL — ABNORMAL HIGH (ref <150)

## 2020-01-07 LAB — INSULIN, RANDOM: Insulin: 3.5 u[IU]/mL

## 2020-01-07 LAB — VITAMIN D 25 HYDROXY (VIT D DEFICIENCY, FRACTURES): Vit D, 25-Hydroxy: 85 ng/mL (ref 30–100)

## 2020-01-07 LAB — TSH: TSH: 0.98 mIU/L (ref 0.40–4.50)

## 2020-01-07 LAB — MAGNESIUM: Magnesium: 2.5 mg/dL (ref 1.5–2.5)

## 2020-01-07 LAB — HEMOGLOBIN A1C
Hgb A1c MFr Bld: 5.7 % of total Hgb — ABNORMAL HIGH (ref <5.7)
Mean Plasma Glucose: 117 (calc)
eAG (mmol/L): 6.5 (calc)

## 2020-01-07 MED ORDER — PHENTERMINE HCL 37.5 MG PO TABS: ORAL_TABLET | ORAL | 1 refills | Status: DC

## 2020-01-08 ENCOUNTER — Ambulatory Visit: Payer: Medicare Other | Admitting: Internal Medicine

## 2020-01-08 NOTE — Progress Notes (Signed)
Patient is aware of lab results. -e welch

## 2020-01-11 ENCOUNTER — Encounter: Payer: Self-pay | Admitting: Internal Medicine

## 2020-01-13 ENCOUNTER — Ambulatory Visit: Payer: Medicare Other | Admitting: Adult Health Nurse Practitioner

## 2020-01-13 NOTE — Progress Notes (Deleted)
MEDICARE ANNUAL WELLNESS VISIT AND FOLLOW UP  Assessment:   Margaret Nelson was seen today for medicare wellness.  Diagnoses and all orders for this visit:  Encounter for Medicare annual wellness exam Yearly Essential hypertension Discussed dietary and exercise modifications -     CBC with Differential/Platelet -     COMPLETE METABOLIC PANEL WITH GFR -     Urinalysis w microscopic + reflex cultur  Extrinsic asthma without complication, unspecified asthma severity, unspecified whether persistent Has rescue inhaler, has not had to use this recently, in date. Continue Breo daily  Gastroesophageal reflux disease, esophagitis presence not specified Continue zantac with benefit -     Magnesium  Hypothyroidism Continue same dose -     TSH  Attention deficit hyperactivity disorder (ADHD), unspecified ADHD type Taking ritalin Doing well with this  Depression, major, recurrent, in partial remission (HCC) Taking Wellbutrin and venlafaxine daily Continue with benefit  Hyperlipidemia, mixed Taking Lipitor 80mg , half tablet daily -     Lipid panel  Severe recurrent major depression without psychotic features ([place]) Continue follow up with this Continue current medication regiment  Herpes simplex -     valACYclovir (VALTREX) 500 MG tablet; Take two tablets by mouth twice a day at onset of symptoms. Asymptomatic at this time  Vitamin D deficiency -     VITAMIN D 25 Hydroxy (Vit-D Deficiency, Fractures)  Abnormal glucose Discussed dietary and exercise modifications -     Hemoglobin A1c -     Insulin, random  Smoker Discussed smoking cessation and resources Not ready to quit at this time Will continue to assess readiness   Body mass index (BMI) of 23.0-23.9 in adult Discussed dietary and exercise modifications  Need for vaccination against Streptococcus pneumoniae using pneumococcal conjugate vaccine 13 -     Pneumococcal conjugate vaccine 13-valent  Medication management -      CBC with Differential/Platelet -     COMPLETE METABOLIC PANEL WITH GFR -     Magnesium -     Lipid panel -     TSH -     Hemoglobin A1c -     Insulin, random -     VITAMIN D 25 Hydroxy (Vit-D Deficiency, Fractures) -     Urinalysis w microscopic + reflex cultur  Abnormal urine finding -     REFLEXIVE URINE CULTURE  Defer labs today, last 01/06/2020  Over 40 minutes of exam, counseling, chart review and critical decision making was performed Future Appointments  Date Time Provider [place]  04/16/2020 11:00 AM Unk Pinto, MD [facility] None  07/28/2020  2:00 PM Unk Pinto, MD [facility] None     Plan:   During the course of the visit the patient was educated and counseled about appropriate screening and preventive services including:    Pneumococcal vaccine   Prevnar 13  Influenza vaccine  Td vaccine  Screening electrocardiogram  Bone densitometry screening  Colorectal cancer screening  Diabetes screening  Glaucoma screening  Nutrition counseling   Advanced directives: requested   Subjective:  Margaret Nelson is a 69 y.o. female who presents for Medicare Annual Wellness Visit and 3 month follow up.   This very nice 69 y.o. DWF presents for an AWV and 3 month follow up for management of multiple medical co-morbidities.  Patient has been followed for HTN, HLD, Prediabetes  and Vitamin D Deficiency. Patient has GERD controlled on her meds. Patient has been on thyroid replacement for many years. Reports she is doing well since last  visit.  She has no health concerns although she would like a refill for valtrex related to intermittent flare ups, usually related to stress.                                         Patient is on SS disability for a long hx/o severe Depression circa 1990's w/hx/o ECT x 9 in 2008. She was seeing Dr Jeneen Montgomery.  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  She reports that this is chronic and remains in this situation.   Reports she has been putting on weight continuously.  She has gained 11 pounds in approximately 7 months. She has not been monitoring her diet or exercising.                             She has had elevated blood pressure for 12 years, since 2008. Her blood pressure has been controlled at home, today their BP is   She does not workout. She denies chest pain, shortness of breath, dizziness.  She is on cholesterol medication and denies myalgias. Her cholesterol is not at goal. The cholesterol last visit was:   Lab Results  Component Value Date   CHOL 150 01/06/2020   HDL 53 01/06/2020   LDLCALC 72 01/06/2020   TRIG 170 (H) 01/06/2020   CHOLHDL 2.8 01/06/2020   She has had pre diabetes with elevated A1c of 5.7 in 2012 and highest at 5.9 in 2016. She has not been working on diet and exercise for prediabetes/abnormal glucose, and denies hyperglycemia, hypoglycemia , nausea, polydipsia and polyuria. Last A1C in the office was:  Lab Results  Component Value Date   HGBA1C 5.7 (H) 01/06/2020   Last GFR:   Lab Results  Component Value Date   GFRNONAA 70 01/06/2020   Lab Results  Component Value Date   GFRAA 81 01/06/2020   Patient is on Vitamin D supplement.   Lab Results  Component Value Date   VD25OH 85 01/06/2020      Medication Review: Current Outpatient Medications on File Prior to Visit  Medication Sig Dispense Refill  . albuterol (PROAIR HFA) 108 (90 Base) MCG/ACT inhaler inhale 2 puffs by mouth every 6 hours if needed for wheezing or shortness of breath 8.5 g 2  . ALPRAZolam (XANAX) 1 MG tablet take 1/2 to 1 tablets by daily for anxiety 120 tablet 0  . atorvastatin (LIPITOR) 80 MG tablet TAKE 1/2 TO 1 TABLET BY MOUTH EVERY DAY AS NEEDED FOR CHOLESTEROL 90 tablet 0  . bimatoprost (LATISSE) 0.03 % ophthalmic solution 1 drop on applicator, apply evenly along the  skin of the upper eyelid at base of eyelashes at bedtime; repeat procedure for second eye 3 mL 12  . bumetanide (BUMEX) 2 MG tablet Take 1 tablet 2 x /day for BP & Fluid Retention / Ankle Swelling 180 tablet 3  . buPROPion (WELLBUTRIN XL) 300 MG 24 hr tablet take 1 tablet by mouth once daily (Patient taking differently: Take 150 mg by mouth 3 (three) times daily. ) 90 tablet 1  . Cholecalciferol (VITAMIN D PO) Take 5,000 Units by mouth daily.    Marland Kitchen esomeprazole (NEXIUM) 20 MG capsule Take 1 capsule every morning for Indigestion &  GERD 90 capsule 0  . famotidine (PEPCID) 40 MG tablet Take 1 tablet at Bedtime for GERD 90 tablet 0  . Flaxseed, Linseed, (FLAX SEED OIL) 1300 MG CAPS Take 1,300 mg by mouth 3 (three) times daily.    Marland Kitchen levothyroxine (SYNTHROID) 50 MCG tablet Take 1.5  tablet daily on an empty stomach with only water for 30 minutes & no Antacid meds, Calcium or Magnesium for 4 hours & avoid Biotin 135 tablet 3  . Multiple Vitamin (MULTIVITAMIN) tablet Take 1 tablet by mouth daily.    . Omega-3 Fatty Acids (FISH OIL) 1000 MG CAPS Take 1 capsule (1,000 mg total) by mouth 3 (three) times daily.  0  . phentermine (ADIPEX-P) 37.5 MG tablet Take 1/2 to 1 tablet every Morning for Dieting & Weight Loss 90 tablet 1  . potassium chloride SA (KLOR-CON) 20 MEQ tablet Take 1 tablet (20 mEq total) by mouth 3 (three) times daily. 270 tablet 1  . topiramate (TOPAMAX) 100 MG tablet Take 1 tablet 2 x /day at Suppertime & Bedtime for Migraine Prevention 180 tablet 1  . triamcinolone ointment (KENALOG) 0.5 % Apply 1 application topically 2 (two) times daily. Apply to rash 2 x /day  (Do not use on face) 30 g 3  . valACYclovir (VALTREX) 500 MG tablet Take two tablets by mouth twice a day at onset of symptoms. 30 tablet 3  . venlafaxine XR (EFFEXOR-XR) 150 MG 24 hr capsule Take 1 capsule (150 mg total) by mouth daily with breakfast. 90 capsule 1   No current facility-administered medications on file prior to visit.     Allergies  Allergen Reactions  . Codeine Other (See Comments)    Abdominal pain  . Erythromycin Base     GI upset  . Penicillins Rash    Current Problems (verified) Patient Active Problem List   Diagnosis Date Noted  . COPD (chronic obstructive pulmonary disease) ([place]) 07/01/2019  . Abnormal glucose 05/21/2018  . Screening for ischemic heart disease 05/21/2018  . Attention deficit hyperactivity disorder (ADHD) 05/21/2018  . Severe recurrent major depression without psychotic features ([place]) 09/17/2017    Class: Chronic  . Extrinsic asthma 07/29/2015  . Body mass index (BMI) of 23.0-23.9 in adult 07/29/2015  . Smoker 04/14/2015  . Gastroesophageal reflux disease 02/15/2015  . Medication management 02/12/2014  . Hypothyroidism   . Essential hypertension 08/18/2013  . Hyperlipidemia, mixed 08/18/2013  . Vitamin D deficiency 08/18/2013    Screening Tests Immunization History  Administered Date(s) Administered  . Influenza Split 06/22/2015  . Influenza, High Dose Seasonal PF 07/16/2018, 07/02/2019  . Influenza,inj,Quad PF,6+ Mos 07/22/2017  . Influenza,inj,quad, With Preservative 07/05/2016  . Influenza-Unspecified 07/22/2017  . PFIZER SARS-COV-2 Vaccination 11/01/2019, 11/22/2019  . Pneumococcal Conjugate-13 12/03/2018  . Pneumococcal-Unspecified 09/25/2004  . Td 09/25/2004  . Tdap 12/01/2015    Preventative care: Last colonoscopy: 2015 Last mammogram: 2011, DUE Last pap smear/pelvic exam: 2016   DEXA:DUE  Prior vaccinations: TD or Tdap: 2017  Influenza: 2019 Pneumococcal: 2006 Prevnar13: received today Shingles/Zostavax: DUE  Check with local pharmacy   Names of Other Physician/Practitioners you currently use: 1. [facility] here for primary care 2.Eye Exam, 2019 3. Dentist 2020  Patient Care Team: Unk Pinto, MD as PCP - General (Internal Medicine) Dian Queen, MD as Consulting Physician (Obstetrics  and Gynecology) Milus Banister, MD as Attending Physician (Gastroenterology)  SURGICAL HISTORY She  has a past surgical history that includes Cholecystectomy (1975); Nasal sinus surgery (  1996); Liposuction (1998); Breast surgery (Bilateral, 1983); Breast reconstruction (2008); Appendectomy; bil breast lumpectomy; bilateral breast masectomy (no breast cancer); and EUS (N/A, 04/23/2014). FAMILY HISTORY Her family history includes ADD / ADHD in her son; Alcohol abuse in her father and paternal uncle; Anxiety disorder in her sister; COPD in her mother; Dementia in her mother; Depression in her mother and sister; Diabetes in her sister; Early death in her father and sister; Emphysema in her mother; Lung cancer in her mother; Schizophrenia in her sister. SOCIAL HISTORY She  reports that she has been smoking e-cigarettes. She has been smoking about 0.50 packs per day. She has never used smokeless tobacco. She reports current alcohol use of about 4.0 - 5.0 standard drinks of alcohol per week. She reports that she does not use drugs.   MEDICARE WELLNESS OBJECTIVES: Physical activity:   Cardiac risk factors:   Depression/mood screen:   Depression screen [facility] 2/9 12/01/2019  Decreased Interest 0  Down, Depressed, Hopeless 0  PHQ - 2 Score 0  Altered sleeping -  Tired, decreased energy -  Change in appetite -  Feeling bad or failure about yourself  -  Trouble concentrating -  Moving slowly or fidgety/restless -  Suicidal thoughts -  PHQ-9 Score -  Difficult doing work/chores -  Some encounter information is confidential and restricted. Go to Review Flowsheets activity to see all data.  Some recent data might be hidden    ADLs:  In your present state of health, do you have any difficulty performing the following activities: 06/24/2019  Hearing? N  Vision? N  Difficulty concentrating or making decisions? N  Walking or climbing stairs? N  Dressing or bathing? N  Doing errands, shopping? N  Some  recent data might be hidden     Cognitive Testing  Alert? Yes  Normal Appearance?Yes  Oriented to person? Yes  Place? Yes   Time? Yes  Recall of three objects?  Yes  Can perform simple calculations? Yes  Displays appropriate judgment?Yes  Can read the correct time from a watch face?Yes  EOL planning:    Review of Systems  Constitutional: Negative for chills, diaphoresis, fever, malaise/fatigue and weight loss.  HENT: Negative for congestion, ear discharge, ear pain, hearing loss, nosebleeds, sinus pain, sore throat and tinnitus.   Eyes: Negative for blurred vision, double vision, photophobia, pain, discharge and redness.  Respiratory: Negative for cough, hemoptysis, sputum production, shortness of breath, wheezing and stridor.   Cardiovascular: Negative for chest pain, palpitations, orthopnea, claudication, leg swelling and PND.  Gastrointestinal: Negative for abdominal pain, blood in stool, constipation, diarrhea, heartburn, melena, nausea and vomiting.  Genitourinary: Negative for dysuria, flank pain, frequency, hematuria and urgency.  Musculoskeletal: Negative for back pain, falls, joint pain, myalgias and neck pain.  Skin: Negative for itching and rash.  Neurological: Negative for dizziness, tingling, tremors, sensory change, speech change, focal weakness, seizures, loss of consciousness, weakness and headaches.  Endo/Heme/Allergies: Negative for environmental allergies and polydipsia. Does not bruise/bleed easily.  Psychiatric/Behavioral: Negative for depression, hallucinations, memory loss, substance abuse and suicidal ideas. The patient is not nervous/anxious and does not have insomnia.      Objective:     There were no vitals filed for this visit. There is no height or weight on file to calculate BMI.  General appearance: alert, no distress, WD/WN, female HEENT: normocephalic, sclerae anicteric, TMs pearly, nares patent, no discharge or erythema, pharynx normal Oral  cavity: MMM, no lesions Neck: supple, no lymphadenopathy, no thyromegaly, no  masses Heart: RRR, normal S1, S2, no murmurs Lungs: CTA bilaterally, no wheezes, rhonchi, or rales Abdomen: +bs, soft, non tender, non distended, no masses, no hepatomegaly, no splenomegaly Musculoskeletal: nontender, no swelling, no obvious deformity Extremities: no edema, no cyanosis, no clubbing Pulses: 2+ symmetric, upper and lower extremities, normal cap refill Neurological: alert, oriented x 3, CN2-12 intact, strength normal upper extremities and lower extremities, sensation normal throughout, DTRs 2+ throughout, no cerebellar signs, gait normal Psychiatric: normal affect, behavior normal, pleasant   Medicare Attestation I have personally reviewed: The patient's medical and social history Their use of alcohol, tobacco or illicit drugs Their current medications and supplements The patient's functional ability including ADLs,fall risks, home safety risks, cognitive, and hearing and visual impairment Diet and physical activities Evidence for depression or mood disorders  The patient's weight, height, BMI, and visual acuity have been recorded in the chart.  I have made referrals, counseling, and provided education to the patient based on review of the above and I have provided the patient with a written personalized care plan for preventive services.     Garnet Sierras, NP   01/13/2020

## 2020-02-12 ENCOUNTER — Other Ambulatory Visit: Payer: Self-pay | Admitting: *Deleted

## 2020-02-12 MED ORDER — ATORVASTATIN CALCIUM 80 MG PO TABS: ORAL_TABLET | ORAL | 0 refills | Status: DC

## 2020-02-17 DIAGNOSIS — F330 Major depressive disorder, recurrent, mild: Secondary | ICD-10-CM | POA: Diagnosis not present

## 2020-02-18 ENCOUNTER — Other Ambulatory Visit: Payer: Self-pay | Admitting: Internal Medicine

## 2020-02-18 DIAGNOSIS — K2100 Gastro-esophageal reflux disease with esophagitis, without bleeding: Secondary | ICD-10-CM

## 2020-04-06 NOTE — Progress Notes (Signed)
MEDICARE ANNUAL WELLNESS VISIT AND FOLLOW UP  Assessment:   Abnormal urine -     Urinalysis, Routine w reflex microscopic -     Urine Culture  Rash of both feet -     triamcinolone cream (KENALOG) 0.5 %; Apply 1 application topically 2 (two) times daily. -     nystatin cream (MYCOSTATIN); Apply 1 application topically 2 (two) times daily. Yeast versus seb keratosis  Overweight -     phentermine (ADIPEX-P) 37.5 MG tablet; Take 1/2 to 1 tablet every Morning for Dieting & Weight Loss  Nostril infection -     mupirocin ointment (BACTROBAN) 2 %; Place 1 application into the nose 2 (two) times daily. - use saline spray, if not better will refer to ENT  Encounter for Medicare annual wellness exam Yearly  Essential hypertension Discussed dietary and exercise modifications -     CBC with Differential/Platelet -     COMPLETE METABOLIC PANEL WITH GFR  Extrinsic asthma without complication, unspecified asthma severity, unspecified whether persistent Has rescue inhaler Continue Breo daily  Gastroesophageal reflux disease, esophagitis presence not specified Continue zantac with benefit -     Magnesium  Hypothyroidism Continue same dose -     TSH  Attention deficit hyperactivity disorder (ADHD), unspecified ADHD type Taking ritalin Doing well with this  Depression, major, recurrent, in partial remission ([place]) Taking Wellbutrin and venlafaxine daily Continue with benefit  Hyperlipidemia, mixed Taking Lipitor 80mg , half tablet daily -     Lipid panel  Severe recurrent major depression without psychotic features ([place]) Continue follow up with this Continue current medication regiment  Herpes simplex -     valACYclovir (VALTREX) 500 MG tablet; Take two tablets by mouth twice a day at onset of symptoms. Asymptomatic at this time  Vitamin D deficiency -     VITAMIN D 25 Hydroxy (Vit-D Deficiency, Fractures)  Abnormal glucose Discussed dietary and exercise modifications -      Hemoglobin A1c -     Insulin, random  Smoker Discussed smoking cessation and resources Not ready to quit at this time Will continue to assess readiness  Body mass index (BMI) of 23.0-23.9 in adult Discussed dietary and exercise modifications  Medication management -     CBC with Differential/Platelet -     COMPLETE METABOLIC PANEL WITH GFR -     Magnesium -     Lipid panel -     TSH -     Hemoglobin A1c -     Insulin, random -     VITAMIN D 25 Hydroxy (Vit-D Deficiency, Fractures) -     Urinalysis w microscopic + reflex cultur   Over 40 minutes of exam, counseling, chart review and critical decision making was performed Future Appointments  Date Time Provider [place]  07/28/2020  2:00 PM Unk Pinto, MD [id number] None     Plan:   During the course of the visit the patient was educated and counseled about appropriate screening and preventive services including:    Pneumococcal vaccine   Prevnar 13  Influenza vaccine  Td vaccine  Screening electrocardiogram  Bone densitometry screening  Colorectal cancer screening  Diabetes screening  Glaucoma screening  Nutrition counseling   Advanced directives: requested   Subjective:  Margaret Nelson is a 69 y.o. female who presents for Medicare Annual Wellness Visit and 3 month follow up.   She states her left nostril feels swollen, scabbing, and she does not breath okay on that side. Noticed 2 weeks  ago. She states she can not breath outside that nostril on that side.  She is on phentermine for weight loss.   She has felt possible UTI symptoms x 2-3 weeks. She states her urine has been cloudy, has had an odor, some left lower back pain. No frequency, burning, itching, vaginal discharge, or urgency.   She has been moving things.    Patient is on SS disability for a long hx/o severe Depression circa 1990's w/hx/o ECT x 9 in 2008. She was seeing Dr Jeneen Montgomery.  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   BMI is Body mass index is 23.17 kg/m., she is working on diet and exercise. Has lost weight coming off the elavil and getting on the topamax and phentermine.  Wt Readings from Last 3 Encounters:  04/07/20 135 lb (61.2 kg)  01/06/20 142 lb 9.6 oz (64.7 kg)  12/01/19 146 lb 3.2 oz (66.3 kg)                She has had elevated blood pressure for 12 years, since 2008. Her blood pressure has been controlled at home, today their BP is BP: 128/76 She does not workout. She denies chest pain, shortness of breath, dizziness.  She is on cholesterol medication and denies myalgias. Her cholesterol is not at goal. The cholesterol last visit was:   Lab Results  Component Value Date   CHOL 150 01/06/2020   HDL 53 01/06/2020   LDLCALC 72 01/06/2020   TRIG 170 (H) 01/06/2020   CHOLHDL 2.8 01/06/2020   She has had pre diabetes with elevated A1c of 5.7 in 2012 and highest at 5.9 in 2016. She has not been working on diet and exercise for prediabetes/abnormal glucose, and denies hyperglycemia, hypoglycemia , nausea, polydipsia and polyuria. Last A1C in the office was:  Lab Results  Component Value Date   HGBA1C 5.7 (H) 01/06/2020   Last GFR:   Lab Results  Component Value Date   GFRNONAA 77 04/07/2020   Patient is on Vitamin D supplement.   Lab Results  Component Value Date   VD25OH 85 01/06/2020      Medication Review:  Current Outpatient Medications (Endocrine & Metabolic):  .  levothyroxine (SYNTHROID) 50 MCG tablet, Take 1.5  tablet daily on an empty stomach with only water for 30 minutes & no Antacid meds, Calcium or Magnesium for 4 hours & avoid Biotin  Current Outpatient Medications (Cardiovascular):  .  atorvastatin (LIPITOR) 80 MG tablet, TAKE 1/2 TO 1 TABLET BY MOUTH EVERY DAY AS NEEDED FOR CHOLESTEROL .  bumetanide (BUMEX) 2 MG tablet, Take 1 tablet 2 x /day for BP &  Fluid Retention / Ankle Swelling  Current Outpatient Medications (Respiratory):  .  albuterol (PROAIR HFA) 108 (90 Base) MCG/ACT inhaler, inhale 2 puffs by mouth every 6 hours if needed for wheezing or shortness of breath    Current Outpatient Medications (Other):  Marland Kitchen  ALPRAZolam (XANAX) 1 MG tablet, take 1/2 to 1 tablets by daily for anxiety .  bimatoprost (LATISSE) 0.03 % ophthalmic solution, 1 drop on applicator, apply evenly along the skin of the upper eyelid at base of eyelashes at bedtime; repeat procedure for second eye .  buPROPion (WELLBUTRIN XL) 300 MG 24 hr tablet, take 1 tablet by mouth once daily (Patient taking differently: Take 150 mg by mouth 3 (three) times daily. ) .  Cholecalciferol (VITAMIN D PO), Take 5,000 Units by mouth daily. Marland Kitchen  esomeprazole (NEXIUM) 20 MG capsule, TAKE ONE CAPSULE BY MOUTH EVERY MORNING FOR INDIGESTION .  famotidine (PEPCID) 40 MG tablet, TAKE ONE TABLET BY MOUTH EVERY NIGHT AT BEDTIME .  Flaxseed, Linseed, (FLAX SEED OIL) 1300 MG CAPS, Take 1,300 mg by mouth 3 (three) times daily. .  Multiple Vitamin (MULTIVITAMIN) tablet, Take 1 tablet by mouth daily. .  Omega-3 Fatty Acids (FISH OIL) 1000 MG CAPS, Take 1 capsule (1,000 mg total) by mouth 3 (three) times daily. .  phentermine (ADIPEX-P) 37.5 MG tablet, Take 1/2 to 1 tablet every Morning for Dieting & Weight Loss .  potassium chloride SA (KLOR-CON) 20 MEQ tablet, Take 1 tablet (20 mEq total) by mouth 3 (three) times daily. Marland Kitchen  topiramate (TOPAMAX) 100 MG tablet, Take 1 tablet 2 x /day at Suppertime & Bedtime for Migraine Prevention .  valACYclovir (VALTREX) 500 MG tablet, Take two tablets by mouth twice a day at onset of symptoms. Marland Kitchen  venlafaxine XR (EFFEXOR-XR) 150 MG 24 hr capsule, Take 1 capsule (150 mg total) by mouth daily with breakfast. .  mupirocin ointment (BACTROBAN) 2 %, Place 1 application into the nose 2 (two) times daily. Marland Kitchen  nystatin cream (MYCOSTATIN), Apply 1 application topically 2 (two)  times daily. Marland Kitchen  triamcinolone cream (KENALOG) 0.5 %, Apply 1 application topically 2 (two) times daily.   Allergies  Allergen Reactions  . Codeine Other (See Comments)    Abdominal pain  . Erythromycin Base     GI upset  . Penicillins Rash    Current Problems (verified) Patient Active Problem List   Diagnosis Date Noted  . COPD (chronic obstructive pulmonary disease) ([place]) 07/01/2019  . Abnormal glucose 05/21/2018  . Screening for ischemic heart disease 05/21/2018  . Attention deficit hyperactivity disorder (ADHD) 05/21/2018  . Severe recurrent major depression without psychotic features ([place]) 09/17/2017    Class: Chronic  . Extrinsic asthma 07/29/2015  . Body mass index (BMI) of 23.0-23.9 in adult 07/29/2015  . Smoker 04/14/2015  . Gastroesophageal reflux disease 02/15/2015  . Medication management 02/12/2014  . Hypothyroidism   . Essential hypertension 08/18/2013  . Hyperlipidemia, mixed 08/18/2013  . Vitamin D deficiency 08/18/2013    Screening Tests Immunization History  Administered Date(s) Administered  . Influenza Split 06/22/2015  . Influenza, High Dose Seasonal PF 07/16/2018, 07/02/2019  . Influenza,inj,Quad PF,6+ Mos 07/22/2017  . Influenza,inj,quad, With Preservative 07/05/2016  . Influenza-Unspecified 07/22/2017  . PFIZER SARS-COV-2 Vaccination 11/01/2019, 11/22/2019  . Pneumococcal Conjugate-13 12/03/2018  . Pneumococcal-Unspecified 09/25/2004  . Td 09/25/2004  . Tdap 12/01/2015   Health Maintenance  Topic Date Due  . Hepatitis C Screening  Never done  . MAMMOGRAM  03/31/2012  . COLONOSCOPY  04/13/2017  . DEXA SCAN  Never done  . PNA vac Low Risk Adult (2 of 2 - PPSV23) 12/03/2019  . INFLUENZA VACCINE  04/25/2020  . TETANUS/TDAP  11/30/2025  . COVID-19 Vaccine  Completed     Preventative care: Last colonoscopy: 2015 due Last mammogram: 2011, DUE Last pap smear/pelvic exam: 2016   DEXA:DUE DECLINES  Names of Other Physician/Practitioners  you currently use: 1. [facility] here for primary care 2.Eye Exam, 2019 3. Dentist 2020  Patient Care Team: Unk Pinto, MD as PCP - General (Internal Medicine) Dian Queen, MD as Consulting Physician (Obstetrics and Gynecology) Milus Banister, MD as Attending Physician (Gastroenterology) Pyrtle, Lajuan Lines, MD as Consulting Physician (Gastroenterology)  SURGICAL HISTORY She  has a past surgical history that includes Cholecystectomy (1975); Nasal sinus surgery (1996); Liposuction (1998); Breast surgery (Bilateral, 1983); Breast reconstruction (2008); Appendectomy; bil breast lumpectomy; bilateral breast masectomy (no breast cancer); and EUS (N/A, 04/23/2014). FAMILY HISTORY Her family history includes ADD / ADHD in her son; Alcohol abuse in her father and paternal uncle; Anxiety disorder in her sister; COPD in her mother; Dementia in her mother; Depression in her mother and sister; Diabetes in her sister; Early death in her father and sister; Emphysema in her mother; Lung cancer in her mother; Schizophrenia in her sister. SOCIAL HISTORY She  reports that she has been smoking e-cigarettes. She has been smoking about 0.50 packs per day. She has never used smokeless tobacco. She reports current alcohol use of about 4.0 - 5.0 standard drinks of alcohol per week. She reports that she does not use drugs.   MEDICARE WELLNESS OBJECTIVES: Physical activity:   Cardiac risk factors:   Depression/mood screen:   Depression screen [facility] 2/9 12/01/2019  Decreased Interest 0  Down, Depressed, Hopeless 0  PHQ - 2 Score 0  Altered sleeping -  Tired, decreased energy -  Change in appetite -  Feeling bad or failure about yourself  -  Trouble concentrating -  Moving slowly or fidgety/restless -  Suicidal thoughts -  PHQ-9 Score -  Difficult doing work/chores -  Some encounter information is confidential and restricted. Go to Review Flowsheets activity to see  all data.  Some recent data might be hidden    ADLs:  In your present state of health, do you have any difficulty performing the following activities: 06/24/2019  Hearing? N  Vision? N  Difficulty concentrating or making decisions? N  Walking or climbing stairs? N  Dressing or bathing? N  Doing errands, shopping? N  Some recent data might be hidden     Cognitive Testing  Alert? Yes  Normal Appearance?Yes  Oriented to person? Yes  Place? Yes   Time? Yes  Recall of three objects?  Yes  Can perform simple calculations? Yes  Displays appropriate judgment?Yes  Can read the correct time from a watch face?Yes  EOL planning: Does Patient Have a Medical Advance Directive?: No Would patient like information on creating a medical advance directive?: Yes (MAU/Ambulatory/Procedural Areas - Information given)  Review of Systems  Constitutional: Negative for chills, diaphoresis, fever, malaise/fatigue and weight loss.  HENT: Negative for congestion, ear discharge, ear pain, hearing loss, nosebleeds, sinus pain, sore throat and tinnitus.   Eyes: Negative for blurred vision, double vision, photophobia, pain, discharge and redness.  Respiratory: Negative for cough, hemoptysis, sputum production, shortness of breath, wheezing and stridor.   Cardiovascular: Negative for chest pain, palpitations, orthopnea, claudication, leg swelling and PND.  Gastrointestinal: Negative for abdominal pain, blood in stool, constipation, diarrhea, heartburn, melena, nausea and vomiting.  Genitourinary: Negative for dysuria, flank pain, frequency, hematuria and urgency.  Musculoskeletal: Negative for back pain, falls, joint pain, myalgias and neck pain.  Skin: Negative for itching and rash.  Neurological: Negative for dizziness, tingling, tremors, sensory change, speech change, focal weakness, seizures, loss of consciousness, weakness and headaches.  Endo/Heme/Allergies: Negative for environmental allergies and  polydipsia. Does not bruise/bleed easily.  Psychiatric/Behavioral: Negative for depression, hallucinations, memory loss, substance abuse and suicidal ideas. The patient is not nervous/anxious and does not have insomnia.      Objective:     Today's Vitals   04/07/20 1451  BP: 128/76  Pulse: 66  Temp: 97.7  F (36.5 C)  SpO2: 99%  Weight: 135 lb (61.2 kg)  PainSc: 0-No pain   Body mass index is 23.17 kg/m.  General appearance: alert, no distress, WD/WN, female HEENT: left nasal passage swelling with erythema, and scabing normocephalic, sclerae anicteric, TMs pearly, nares patent, no discharge or erythema, pharynx normal Oral cavity: MMM, no lesions Neck: supple, no lymphadenopathy, no thyromegaly, no masses Heart: RRR, normal S1, S2, no murmurs Lungs: CTA bilaterally, no wheezes, rhonchi, or rales Abdomen: +bs, soft, non tender, non distended, no masses, no hepatomegaly, no splenomegaly Musculoskeletal: nontender, no swelling, no obvious deformity Extremities: no edema, no cyanosis, no clubbing Pulses: 2+ symmetric, upper and lower extremities, normal cap refill Neurological: alert, oriented x 3, CN2-12 intact, strength normal upper extremities and lower extremities, sensation normal throughout, DTRs 2+ throughout, no cerebellar signs, gait normal Psychiatric: normal affect, behavior normal, pleasant   Medicare Attestation I have personally reviewed: The patient's medical and social history Their use of alcohol, tobacco or illicit drugs Their current medications and supplements The patient's functional ability including ADLs,fall risks, home safety risks, cognitive, and hearing and visual impairment Diet and physical activities Evidence for depression or mood disorders  The patient's weight, height, BMI, and visual acuity have been recorded in the chart.  I have made referrals, counseling, and provided education to the patient based on review of the above and I have provided  the patient with a written personalized care plan for preventive services.     Vicie Mutters, PA-C   04/09/2020

## 2020-04-07 ENCOUNTER — Encounter: Payer: Self-pay | Admitting: Physician Assistant

## 2020-04-07 ENCOUNTER — Ambulatory Visit (INDEPENDENT_AMBULATORY_CARE_PROVIDER_SITE_OTHER): Payer: Medicare Other | Admitting: Physician Assistant

## 2020-04-07 ENCOUNTER — Other Ambulatory Visit: Payer: Self-pay

## 2020-04-07 VITALS — BP 128/76 | HR 66 | Temp 97.7°F | Wt 135.0 lb

## 2020-04-07 DIAGNOSIS — R6889 Other general symptoms and signs: Secondary | ICD-10-CM

## 2020-04-07 DIAGNOSIS — Z0001 Encounter for general adult medical examination with abnormal findings: Secondary | ICD-10-CM | POA: Diagnosis not present

## 2020-04-07 DIAGNOSIS — I10 Essential (primary) hypertension: Secondary | ICD-10-CM | POA: Diagnosis not present

## 2020-04-07 DIAGNOSIS — E782 Mixed hyperlipidemia: Secondary | ICD-10-CM | POA: Diagnosis not present

## 2020-04-07 DIAGNOSIS — F332 Major depressive disorder, recurrent severe without psychotic features: Secondary | ICD-10-CM | POA: Diagnosis not present

## 2020-04-07 DIAGNOSIS — J449 Chronic obstructive pulmonary disease, unspecified: Secondary | ICD-10-CM | POA: Diagnosis not present

## 2020-04-07 DIAGNOSIS — B009 Herpesviral infection, unspecified: Secondary | ICD-10-CM | POA: Diagnosis not present

## 2020-04-07 DIAGNOSIS — R7309 Other abnormal glucose: Secondary | ICD-10-CM | POA: Diagnosis not present

## 2020-04-07 DIAGNOSIS — E079 Disorder of thyroid, unspecified: Secondary | ICD-10-CM

## 2020-04-07 DIAGNOSIS — F17200 Nicotine dependence, unspecified, uncomplicated: Secondary | ICD-10-CM | POA: Diagnosis not present

## 2020-04-07 DIAGNOSIS — K219 Gastro-esophageal reflux disease without esophagitis: Secondary | ICD-10-CM | POA: Diagnosis not present

## 2020-04-07 DIAGNOSIS — J45909 Unspecified asthma, uncomplicated: Secondary | ICD-10-CM | POA: Diagnosis not present

## 2020-04-07 DIAGNOSIS — Z79899 Other long term (current) drug therapy: Secondary | ICD-10-CM

## 2020-04-07 DIAGNOSIS — R21 Rash and other nonspecific skin eruption: Secondary | ICD-10-CM

## 2020-04-07 DIAGNOSIS — E559 Vitamin D deficiency, unspecified: Secondary | ICD-10-CM | POA: Diagnosis not present

## 2020-04-07 DIAGNOSIS — Z0000 Encounter for general adult medical examination without abnormal findings: Secondary | ICD-10-CM

## 2020-04-07 DIAGNOSIS — E663 Overweight: Secondary | ICD-10-CM

## 2020-04-07 DIAGNOSIS — R8290 Unspecified abnormal findings in urine: Secondary | ICD-10-CM | POA: Diagnosis not present

## 2020-04-07 DIAGNOSIS — J3489 Other specified disorders of nose and nasal sinuses: Secondary | ICD-10-CM

## 2020-04-07 MED ORDER — MUPIROCIN 2 % EX OINT: 1.0000 "application " | TOPICAL_OINTMENT | Freq: Two times a day (BID) | CUTANEOUS | 0 refills | Status: DC

## 2020-04-07 MED ORDER — POTASSIUM CHLORIDE CRYS ER 20 MEQ PO TBCR: 20.0000 meq | EXTENDED_RELEASE_TABLET | Freq: Three times a day (TID) | ORAL | 1 refills | Status: DC

## 2020-04-07 MED ORDER — PHENTERMINE HCL 37.5 MG PO TABS: ORAL_TABLET | ORAL | 1 refills | Status: DC

## 2020-04-07 MED ORDER — NYSTATIN 100000 UNIT/GM EX CREA: 1.0000 "application " | TOPICAL_CREAM | Freq: Two times a day (BID) | CUTANEOUS | 1 refills | Status: DC

## 2020-04-07 MED ORDER — TRIAMCINOLONE ACETONIDE 0.5 % EX CREA: 1.0000 "application " | TOPICAL_CREAM | Freq: Two times a day (BID) | CUTANEOUS | 2 refills | Status: DC

## 2020-04-07 MED ORDER — BIMATOPROST 0.03 % EX SOLN: CUTANEOUS | 12 refills | Status: DC

## 2020-04-07 MED ORDER — VALACYCLOVIR HCL 500 MG PO TABS: ORAL_TABLET | ORAL | 3 refills | Status: DC

## 2020-04-07 NOTE — Patient Instructions (Addendum)
Use the nystatin and triamcinolone cream twice a day for 1-2 month on the rash If this is not better we can schedule a freezing.   Going to refer to ENT- if better can stop Going to give you an ointment to put on your nose  Use saline nasal spray Humidifer Steam showers  Let me know if you get fever, chills, sinus pain, sore throat  Please go to the ER if you have trouble swallowing, trouble breathing, difficulty opening mouth, high fevers, or severe neck pain.   HOW TO SCHEDULE A MAMMOGRAM  [facility]  7 a.m.-6:30 p.m., Monday 7 a.m.-5 p.m., Tuesday-Friday Schedule an appointment by calling [phone].   Add ENTERIC COATED low dose 81 mg Aspirin daily OR can do every other day if you have easy bruising to protect your heart and head. As well as to reduce risk of Colon Cancer by 20 %, Skin Cancer by 26 % , Melanoma by 46% and Pancreatic cancer by 60% CAN HELP WITH YOUR COLON POLYPS YOU ARE OVERDUE FOR YOU COLONOSCOPY Phone: [phone]

## 2020-04-08 LAB — COMPLETE METABOLIC PANEL WITH GFR
AG Ratio: 1.7 (calc) (ref 1.0–2.5)
ALT: 15 U/L (ref 6–29)
AST: 12 U/L (ref 10–35)
Albumin: 4.1 g/dL (ref 3.6–5.1)
Alkaline phosphatase (APISO): 77 U/L (ref 37–153)
BUN: 17 mg/dL (ref 7–25)
CO2: 32 mmol/L (ref 20–32)
Calcium: 8.9 mg/dL (ref 8.6–10.4)
Chloride: 104 mmol/L (ref 98–110)
Creat: 0.79 mg/dL (ref 0.50–0.99)
GFR, Est African American: 90 mL/min/{1.73_m2} (ref >=60)
GFR, Est Non African American: 77 mL/min/{1.73_m2} (ref >=60)
Globulin: 2.4 g/dL (calc) (ref 1.9–3.7)
Glucose, Bld: 96 mg/dL (ref 65–99)
Potassium: 3.7 mmol/L (ref 3.5–5.3)
Sodium: 143 mmol/L (ref 135–146)
Total Bilirubin: 0.4 mg/dL (ref 0.2–1.2)
Total Protein: 6.5 g/dL (ref 6.1–8.1)

## 2020-04-08 LAB — CBC WITH DIFFERENTIAL/PLATELET
Absolute Monocytes: 502 cells/uL (ref 200–950)
Basophils Absolute: 81 cells/uL (ref 0–200)
Basophils Relative: 1 %
Eosinophils Absolute: 211 cells/uL (ref 15–500)
Eosinophils Relative: 2.6 %
HCT: 35.7 % (ref 35.0–45.0)
Hemoglobin: 12 g/dL (ref 11.7–15.5)
Lymphs Abs: 3491 cells/uL (ref 850–3900)
MCH: 32.4 pg (ref 27.0–33.0)
MCHC: 33.6 g/dL (ref 32.0–36.0)
MCV: 96.5 fL (ref 80.0–100.0)
MPV: 11 fL (ref 7.5–12.5)
Monocytes Relative: 6.2 %
Neutro Abs: 3815 cells/uL (ref 1500–7800)
Neutrophils Relative %: 47.1 %
Platelets: 302 10*3/uL (ref 140–400)
RBC: 3.7 10*6/uL — ABNORMAL LOW (ref 3.80–5.10)
RDW: 12.5 % (ref 11.0–15.0)
Total Lymphocyte: 43.1 %
WBC: 8.1 10*3/uL (ref 3.8–10.8)

## 2020-04-08 LAB — URINALYSIS, ROUTINE W REFLEX MICROSCOPIC
Bilirubin Urine: NEGATIVE
Glucose, UA: NEGATIVE
Hgb urine dipstick: NEGATIVE
Ketones, ur: NEGATIVE
Leukocytes,Ua: NEGATIVE
Nitrite: NEGATIVE
Protein, ur: NEGATIVE
Specific Gravity, Urine: 1.009 (ref 1.001–1.03)
pH: 7 (ref 5.0–8.0)

## 2020-04-08 LAB — URINE CULTURE
MICRO NUMBER:: 10705122
SPECIMEN QUALITY:: ADEQUATE

## 2020-04-12 NOTE — Progress Notes (Signed)
PATIENT IS AWARE OF LAB RESULTS. [facility]

## 2020-04-15 ENCOUNTER — Ambulatory Visit: Payer: Medicare Other | Admitting: Physician Assistant

## 2020-04-16 ENCOUNTER — Ambulatory Visit: Payer: Medicare Other | Admitting: Internal Medicine

## 2020-04-26 DIAGNOSIS — F330 Major depressive disorder, recurrent, mild: Secondary | ICD-10-CM | POA: Diagnosis not present

## 2020-05-06 ENCOUNTER — Other Ambulatory Visit: Payer: Self-pay | Admitting: Physician Assistant

## 2020-05-06 DIAGNOSIS — B009 Herpesviral infection, unspecified: Secondary | ICD-10-CM

## 2020-05-13 ENCOUNTER — Other Ambulatory Visit: Payer: Self-pay | Admitting: Internal Medicine

## 2020-05-13 ENCOUNTER — Ambulatory Visit: Payer: Medicare Other | Admitting: Adult Health Nurse Practitioner

## 2020-05-13 DIAGNOSIS — B009 Herpesviral infection, unspecified: Secondary | ICD-10-CM

## 2020-05-13 MED ORDER — VALACYCLOVIR HCL 500 MG PO TABS: ORAL_TABLET | ORAL | 0 refills | Status: AC

## 2020-05-17 ENCOUNTER — Ambulatory Visit (INDEPENDENT_AMBULATORY_CARE_PROVIDER_SITE_OTHER): Payer: Medicare Other | Admitting: Internal Medicine

## 2020-05-17 ENCOUNTER — Other Ambulatory Visit: Payer: Self-pay

## 2020-05-17 ENCOUNTER — Other Ambulatory Visit: Payer: Self-pay | Admitting: Internal Medicine

## 2020-05-17 VITALS — BP 102/62 | HR 64 | Temp 97.0°F | Resp 16 | Ht 64.0 in | Wt 134.6 lb

## 2020-05-17 DIAGNOSIS — F3341 Major depressive disorder, recurrent, in partial remission: Secondary | ICD-10-CM | POA: Diagnosis not present

## 2020-05-17 DIAGNOSIS — J4521 Mild intermittent asthma with (acute) exacerbation: Secondary | ICD-10-CM

## 2020-05-17 DIAGNOSIS — R0789 Other chest pain: Secondary | ICD-10-CM

## 2020-05-17 MED ORDER — ALBUTEROL SULFATE HFA 108 (90 BASE) MCG/ACT IN AERS: INHALATION_SPRAY | RESPIRATORY_TRACT | 2 refills | Status: DC

## 2020-05-17 MED ORDER — MELOXICAM 15 MG PO TABS: ORAL_TABLET | ORAL | 0 refills | Status: DC

## 2020-05-17 MED ORDER — ALPRAZOLAM 1 MG PO TABS: ORAL_TABLET | ORAL | 0 refills | Status: DC

## 2020-05-17 NOTE — Progress Notes (Signed)
History of Present Illness:      This very nice 69 y.o. DWF  with HTN, HLD, Pre-Diabetes,l hx/o Depression and Vitamin D Deficiency presents with several week hx/o lower Lt chest wall pain which she describes as a "soreness" . Discomfort is positional & not exertional and not associated with dyspnea, cough, congestion or GI sx's.    Medications  Current Outpatient Medications (Endocrine & Metabolic):  .  levothyroxine (SYNTHROID) 50 MCG tablet, Take 1.5  tablet daily on an empty stomach with only water for 30 minutes & no Antacid meds, Calcium or Magnesium for 4 hours & avoid Biotin  Current Outpatient Medications (Cardiovascular):  .  atorvastatin (LIPITOR) 80 MG tablet, TAKE 1/2 TO 1 TABLET BY MOUTH EVERY DAY AS NEEDED FOR CHOLESTEROL .  bumetanide (BUMEX) 2 MG tablet, Take 1 tablet 2 x /day for BP & Fluid Retention / Ankle Swelling  Current Outpatient Medications (Respiratory):  .  albuterol (PROAIR HFA) 108 (90 Base) MCG/ACT inhaler, inhale 2 puffs by mouth every 6 hours if needed for wheezing or shortness of breath  Current Outpatient Medications (Analgesics):  .  meloxicam (MOBIC) 15 MG tablet, Take 1/2 to 1 tablet Daily with Food for Pain & Inflammation   Current Outpatient Medications (Other):  Marland Kitchen  ALPRAZolam (XANAX) 1 MG tablet, take 1/2 to 1 tablets by daily for anxiety .  bimatoprost (LATISSE) 0.03 % ophthalmic solution, 1 drop on applicator, apply evenly along the skin of the upper eyelid at base of eyelashes at bedtime; repeat procedure for second eye .  buPROPion (WELLBUTRIN XL) 300 MG 24 hr tablet, take 1 tablet by mouth once daily (Patient taking differently: Take 150 mg by mouth 3 (three) times daily. ) .  Cholecalciferol (VITAMIN D PO), Take 5,000 Units by mouth daily. Marland Kitchen  esomeprazole (NEXIUM) 20 MG capsule, TAKE ONE CAPSULE BY MOUTH EVERY MORNING FOR INDIGESTION .  famotidine (PEPCID) 40 MG tablet, TAKE ONE TABLET BY MOUTH EVERY NIGHT AT BEDTIME .  Multiple Vitamin  (MULTIVITAMIN) tablet, Take 1 tablet by mouth daily. .  mupirocin ointment (BACTROBAN) 2 %, Place 1 application into the nose 2 (two) times daily. Marland Kitchen  nystatin cream (MYCOSTATIN), Apply 1 application topically 2 (two) times daily. .  phentermine (ADIPEX-P) 37.5 MG tablet, Take 1/2 to 1 tablet every Morning for Dieting & Weight Loss .  potassium chloride SA (KLOR-CON) 20 MEQ tablet, Take 1 tablet (20 mEq total) by mouth 3 (three) times daily. Marland Kitchen  topiramate (TOPAMAX) 100 MG tablet, Take 1 tablet 2 x /day at Suppertime & Bedtime for Migraine Prevention .  triamcinolone cream (KENALOG) 0.5 %, Apply 1 application topically 2 (two) times daily. .  valACYclovir (VALTREX) 500 MG tablet, Take 2 tablets 2 x /day at Onset of Symptoms .  venlafaxine XR (EFFEXOR-XR) 150 MG 24 hr capsule, Take 1 capsule (150 mg total) by mouth daily with breakfast. .  Flaxseed, Linseed, (FLAX SEED OIL) 1300 MG CAPS, Take 1,300 mg by mouth 3 (three) times daily. (Patient not taking: Reported on 05/17/2020) .  Omega-3 Fatty Acids (FISH OIL) 1000 MG CAPS, Take 1 capsule (1,000 mg total) by mouth 3 (three) times daily. (Patient not taking: Reported on 05/17/2020)  Problem list She has Essential hypertension; Hyperlipidemia, mixed; Vitamin D deficiency; Hypothyroidism; Medication management; Gastroesophageal reflux disease; Smoker; Extrinsic asthma; Body mass index (BMI) of 23.0-23.9 in adult; Severe recurrent major depression without psychotic features ([place]); Abnormal glucose; Screening for ischemic heart disease; Attention deficit hyperactivity disorder (ADHD); and COPD (  chronic obstructive pulmonary disease) ([facility]) on their problem list.   Observations/Objective:  BP 102/62   Pulse 64   Temp (!) 97 F (36.1 C)   Resp 16   Ht 5\' 4"  (1.626 m)   Wt 134 lb 9.6 oz (61.1 kg)   BMI 23.10 kg/m   HEENT - WNL. Neck - supple.  Chest - Clear equal BS. Tender lowe left anterior chest wall.  Cor - Nl HS. RRR w/o sig MGR. PP 1(+). No  edema. MS- FROM w/o deformities.  Gait Nl. Neuro -  Nl w/o focal abnormalities.  Assessment and Plan:   1. Chest wall pain  - meloxicam (MOBIC) 15 MG tablet; Take 1/2 to 1 tablet Daily with Food for Pain & Inflammation  Dispense: 90 tablet; Refill: 0  2. Depression, major, recurrent, in partial remission ([facility])  - ALPRAZolam (XANAX) 1 MG tablet; take 1/2 to 1 tablets    Daily for anxiety  Dispense: 120 tablet; Refill: 0  3. Mild intermittent asthmatic bronchitis  - Asked for refill to have on hand for  - albuterol (PROAIR HFA) 108 (90 Base) MCG/ACT inhaler; Use 2 inhalations    15 minutes   every 4 hours      to rescue Asthma  Dispense: 8.5 g; Refill: 2  Follow Up Instructions:    I discussed the assessment and treatment plan with the patient. The patient was provided an opportunity to ask questions and all were answered. The patient agreed with the plan and demonstrated an understanding of the instructions.   The patient was advised to call back or seek an in-person evaluation if the symptoms worsen or if the condition fails to improve as anticipated.   Kirtland Bouchard, MD

## 2020-05-17 NOTE — Patient Instructions (Signed)
Chest Wall Pain Chest wall pain is pain in or around the bones and muscles of your chest. Sometimes, an injury causes this pain. Excessive coughing or overuse of arm and chest muscles may also cause chest wall pain. Sometimes, the cause may not be known. This pain may take several weeks or longer to get better. Follow these instructions at home: Managing pain, stiffness, and swelling   If directed, put ice on the painful area: ? Put ice in a plastic bag. ? Place a towel between your skin and the bag. ? Leave the ice on for 20 minutes, 2-3 times per day. Activity  Rest as told by your health care provider.  Avoid activities that cause pain. These include any activities that use your chest muscles or your abdominal and side muscles to lift heavy items. Ask your health care provider what activities are safe for you. General instructions   Take over-the-counter and prescription medicines only as told by your health care provider.  Do not use any products that contain nicotine or tobacco, such as cigarettes, e-cigarettes, and chewing tobacco. These can delay healing after injury. If you need help quitting, ask your health care provider.  Keep all follow-up visits as told by your health care provider. This is important. Contact a health care provider if:  You have a fever.  Your chest pain becomes worse.  You have new symptoms. Get help right away if:  You have nausea or vomiting.  You feel sweaty or light-headed.  You have a cough with mucus from your lungs (sputum) or you cough up blood.  You develop shortness of breath. These symptoms may represent a serious problem that is an emergency. Do not wait to see if the symptoms will go away. Get medical help right away. Call your local emergency services (911 in the [place] Do not drive yourself to the hospital. Summary  Chest wall pain is pain in or around the bones and muscles of your chest.  Depending on the cause, it may be  treated with ice, rest, medicines, and avoiding activities that cause pain.  Contact a health care provider if you have a fever, worsening chest pain, or new symptoms.  Get help right away if you feel light-headed or you develop shortness of breath. These symptoms may be an emergency.

## 2020-06-14 ENCOUNTER — Other Ambulatory Visit: Payer: Self-pay

## 2020-06-14 ENCOUNTER — Ambulatory Visit (INDEPENDENT_AMBULATORY_CARE_PROVIDER_SITE_OTHER): Payer: Medicare Other | Admitting: Physician Assistant

## 2020-06-14 ENCOUNTER — Encounter: Payer: Self-pay | Admitting: Physician Assistant

## 2020-06-14 VITALS — BP 110/64 | HR 72 | Temp 97.6°F | Wt 129.0 lb

## 2020-06-14 DIAGNOSIS — K2100 Gastro-esophageal reflux disease with esophagitis, without bleeding: Secondary | ICD-10-CM | POA: Diagnosis not present

## 2020-06-14 DIAGNOSIS — R109 Unspecified abdominal pain: Secondary | ICD-10-CM | POA: Diagnosis not present

## 2020-06-14 DIAGNOSIS — Z79899 Other long term (current) drug therapy: Secondary | ICD-10-CM

## 2020-06-14 DIAGNOSIS — R1310 Dysphagia, unspecified: Secondary | ICD-10-CM | POA: Diagnosis not present

## 2020-06-14 DIAGNOSIS — R140 Abdominal distension (gaseous): Secondary | ICD-10-CM

## 2020-06-14 MED ORDER — ESOMEPRAZOLE MAGNESIUM 40 MG PO CPDR: DELAYED_RELEASE_CAPSULE | ORAL | 1 refills | Status: DC

## 2020-06-14 NOTE — Patient Instructions (Addendum)
Increase the nexium to two times a day  or 40 mg I have sent in a 40 mg pill for you to take  Stop the phentermine Take the mobic as needed  Will get CT scan of your AB and pelvis  Please go to the ER if you have any severe AB pain, unable to hold down food/water, blood in stool or vomit, chest pain, shortness of breath, or any worsening symptoms.    Abdominal Pain, Adult Pain in the abdomen (abdominal pain) can be caused by many things. Often, abdominal pain is not serious and it gets better with no treatment or by being treated at home. However, sometimes abdominal pain is serious. Your health care provider will ask questions about your medical history and do a physical exam to try to determine the cause of your abdominal pain. Follow these instructions at home:  Medicines  Take over-the-counter and prescription medicines only as told by your health care provider.  Do not take a laxative unless told by your health care provider. General instructions  Watch your condition for any changes.  Drink enough fluid to keep your urine pale yellow.  Keep all follow-up visits as told by your health care provider. This is important. Contact a health care provider if:  Your abdominal pain changes or gets worse.  You are not hungry or you lose weight without trying.  You are constipated or have diarrhea for more than 2-3 days.  You have pain when you urinate or have a bowel movement.  Your abdominal pain wakes you up at night.  Your pain gets worse with meals, after eating, or with certain foods.  You are vomiting and cannot keep anything down.  You have a fever.  You have blood in your urine. Get help right away if:  Your pain does not go away as soon as your health care provider told you to expect.  You cannot stop vomiting.  Your pain is only in areas of the abdomen, such as the right side or the left lower portion of the abdomen. Pain on the right side could be caused by  appendicitis.  You have bloody or black stools, or stools that look like tar.  You have severe pain, cramping, or bloating in your abdomen.  You have signs of dehydration, such as: ? Dark urine, very little urine, or no urine. ? Cracked lips. ? Dry mouth. ? Sunken eyes. ? Sleepiness. ? Weakness.  You have trouble breathing or chest pain. Summary  Often, abdominal pain is not serious and it gets better with no treatment or by being treated at home. However, sometimes abdominal pain is serious.  Watch your condition for any changes.  Take over-the-counter and prescription medicines only as told by your health care provider.  Contact a health care provider if your abdominal pain changes or gets worse.  Get help right away if you have severe pain, cramping, or bloating in your abdomen. This information is not intended to replace advice given to you by your health care provider. Make sure you discuss any questions you have with your health care provider. Document Revised: 01/20/2019 Document Reviewed: 01/20/2019 Elsevier Patient Education  [place].

## 2020-06-14 NOTE — Progress Notes (Signed)
Subjective:    Patient ID: Margaret Nelson, female    DOB: 08-07-1951, 69 y.o.   MRN: [id number]  HPI 69 y.o. history of smoking female, now on jewel x 3 years, WF with history of constipation presents with swelling generalized and AB pain x 1 month. She is s/p choley and appendectomy.  More epigastric pain, intermittent. Worse with standing in one place for too long (ie peel potatoes/washing dishes), will take her breath away due to the pain. Walking may help some. Some nausea with it and will vomit occ, some trouble with swallowing pills/water, not eating meat at this time due to teeth.   Some SOB with it due to pain and no pain or SOB with walking.  She states she has AB bloating and pain in her AB, had worst swelling/pain Sept 4th, knows the date because she was going to a wedding.  She is complaining of worsening veins bilateral legs.   She is on nexium 20 mg daily with pepcid, she is on mobic daily, no ETOH.   She has had weight loss but she is unable to eat due to her teeth, she is waiting to get a partial/dentures, this has been ongoing x 2 years due to COVID, worsening her depression.  Wt Readings from Last 5 Encounters:  06/14/20 129 lb (58.5 kg)  05/17/20 134 lb 9.6 oz (61.1 kg)  04/07/20 135 lb (61.2 kg)  01/06/20 142 lb 9.6 oz (64.7 kg)  12/01/19 146 lb 3.2 oz (66.3 kg)    For her depression, she follows up with Dr. Casimiro Needle. She does not have an appointment until Oct with him.   Colonoscopy 2015 Dr. Hilarie Fredrickson.  CXR 2017 She  has a past surgical history that includes Cholecystectomy (1975); Nasal sinus surgery (1996); Liposuction (1998); Breast surgery (Bilateral, 1983); Breast reconstruction (2008); Appendectomy; bil breast lumpectomy; bilateral breast masectomy (no breast cancer); and EUS (N/A, 04/23/2014).  Her family history includes ADD / ADHD in her son; Alcohol abuse in her father and paternal uncle; Anxiety disorder in her sister; COPD in her mother; Dementia in her  mother; Depression in her mother and sister; Diabetes in her sister; Early death in her father and sister; Emphysema in her mother; Lung cancer in her mother; Schizophrenia in her sister.  Blood pressure 110/64, pulse 72, temperature 97.6 F (36.4 C), weight 129 lb (58.5 kg), SpO2 96 %.   Medications  Current Outpatient Medications (Endocrine & Metabolic):  .  levothyroxine (SYNTHROID) 50 MCG tablet, Take 1.5  tablet daily on an empty stomach with only water for 30 minutes & no Antacid meds, Calcium or Magnesium for 4 hours & avoid Biotin  Current Outpatient Medications (Cardiovascular):  .  atorvastatin (LIPITOR) 80 MG tablet, TAKE 1/2 TO 1 TABLET BY MOUTH EVERY DAY AS NEEDED FOR CHOLESTEROL .  bumetanide (BUMEX) 2 MG tablet, Take 1 tablet 2 x /day for BP & Fluid Retention / Ankle Swelling  Current Outpatient Medications (Respiratory):  .  albuterol (PROAIR HFA) 108 (90 Base) MCG/ACT inhaler, Use 2 inhalations    15 minutes   every 4 hours      to rescue Asthma  Current Outpatient Medications (Analgesics):  .  meloxicam (MOBIC) 15 MG tablet, Take 1/2 to 1 tablet Daily with Food for Pain & Inflammation   Current Outpatient Medications (Other):  Marland Kitchen  ALPRAZolam (XANAX) 1 MG tablet, take 1/2 to 1 tablets    Daily for anxiety .  bimatoprost (LATISSE) 0.03 % ophthalmic solution, 1  drop on applicator, apply evenly along the skin of the upper eyelid at base of eyelashes at bedtime; repeat procedure for second eye .  buPROPion (WELLBUTRIN XL) 300 MG 24 hr tablet, take 1 tablet by mouth once daily (Patient taking differently: Take 150 mg by mouth 3 (three) times daily. ) .  Cholecalciferol (VITAMIN D PO), Take 5,000 Units by mouth daily. Marland Kitchen  esomeprazole (NEXIUM) 40 MG capsule, TAKE ONE CAPSULE BY MOUTH EVERY MORNING FOR INDIGESTION .  famotidine (PEPCID) 40 MG tablet, TAKE ONE TABLET BY MOUTH EVERY NIGHT AT BEDTIME .  Flaxseed, Linseed, (FLAX SEED OIL) 1300 MG CAPS, Take 1,300 mg by mouth 3  (three) times daily. .  Multiple Vitamin (MULTIVITAMIN) tablet, Take 1 tablet by mouth daily. .  mupirocin ointment (BACTROBAN) 2 %, Place 1 application into the nose 2 (two) times daily. Marland Kitchen  nystatin cream (MYCOSTATIN), Apply 1 application topically 2 (two) times daily. .  Omega-3 Fatty Acids (FISH OIL) 1000 MG CAPS, Take 1 capsule (1,000 mg total) by mouth 3 (three) times daily. .  potassium chloride SA (KLOR-CON) 20 MEQ tablet, Take 1 tablet (20 mEq total) by mouth 3 (three) times daily. Marland Kitchen  topiramate (TOPAMAX) 100 MG tablet, Take 1 tablet 2 x /day at Suppertime & Bedtime for Migraine Prevention .  triamcinolone cream (KENALOG) 0.5 %, Apply 1 application topically 2 (two) times daily. .  valACYclovir (VALTREX) 500 MG tablet, Take 2 tablets 2 x /day at Onset of Symptoms  Problem list She has Essential hypertension; Hyperlipidemia, mixed; Vitamin D deficiency; Hypothyroidism; Medication management; Gastroesophageal reflux disease; Smoker; Extrinsic asthma; Body mass index (BMI) of 23.0-23.9 in adult; Severe recurrent major depression without psychotic features ([place]); Abnormal glucose; Screening for ischemic heart disease; Attention deficit hyperactivity disorder (ADHD); and COPD (chronic obstructive pulmonary disease) (HCC) on their problem list.   Review of Systems See HPI    Objective:   Physical Exam General appearance: alert, no distress, WD/WN, female HEENT: left nasal passage swelling with erythema, and scabing normocephalic, sclerae anicteric, TMs pearly, nares patent, no discharge or erythema, pharynx normal Oral cavity: MMM, no lesions Neck: supple, no lymphadenopathy, no thyromegaly, no masses Heart: RRR, normal S1, S2, no murmurs Lungs: CTA bilaterally, no wheezes, rhonchi, or rales Abdomen: +bs, soft, distended, + non focal diffuse tenderness,  no masses, no hepatomegaly, no splenomegaly Musculoskeletal: nontender, no swelling, no obvious deformity Extremities: no edema, no  cyanosis, no clubbing Pulses: 2+ symmetric, upper and lower extremities, normal cap refill Neurological: alert, oriented x 3, CN2-12 intact, strength normal upper extremities and lower extremities, sensation normal throughout, DTRs 2+ throughout, no cerebellar signs, gait normal Psychiatric: normal affect, behavior normal, pleasant       Assessment & Plan:   Abdominal pain, unspecified abdominal location with bloating, with GERD/dyphagia and some vascular leg swelling She has had weight loss but due to medications likely Will get CT scan AB/pelvis to rule out compressive symptoms or CA due to weight loss and symptoms Will check pancreatic labs Will refer to GI for EGD and put on PPI, she will try to cut back on mobic daily use -     Amylase -     Lipase  Gastroesophageal reflux disease with esophagitis -     esomeprazole (NEXIUM) 40 MG capsule; TAKE ONE CAPSULE BY MOUTH EVERY MORNING FOR INDIGESTION  Medication management -     CBC with Differential/Platelet -     COMPLETE METABOLIC PANEL WITH GFR -     TSH  Dysphagia, unspecified type -     Ambulatory referral to Gastroenterology Get on PPI, may need EGD  Abdominal distension (gaseous) -     CT ABDOMEN PELVIS W CONTRAST; Future

## 2020-06-15 LAB — CBC WITH DIFFERENTIAL/PLATELET
Absolute Monocytes: 419 cells/uL (ref 200–950)
Basophils Absolute: 92 cells/uL (ref 0–200)
Basophils Relative: 1.3 %
Eosinophils Absolute: 220 cells/uL (ref 15–500)
Eosinophils Relative: 3.1 %
HCT: 38.1 % (ref 35.0–45.0)
Hemoglobin: 12.5 g/dL (ref 11.7–15.5)
Lymphs Abs: 3067 cells/uL (ref 850–3900)
MCH: 31.5 pg (ref 27.0–33.0)
MCHC: 32.8 g/dL (ref 32.0–36.0)
MCV: 96 fL (ref 80.0–100.0)
MPV: 11.1 fL (ref 7.5–12.5)
Monocytes Relative: 5.9 %
Neutro Abs: 3302 cells/uL (ref 1500–7800)
Neutrophils Relative %: 46.5 %
Platelets: 332 10*3/uL (ref 140–400)
RBC: 3.97 10*6/uL (ref 3.80–5.10)
RDW: 12.2 % (ref 11.0–15.0)
Total Lymphocyte: 43.2 %
WBC: 7.1 10*3/uL (ref 3.8–10.8)

## 2020-06-15 LAB — AMYLASE: Amylase: 37 U/L (ref 21–101)

## 2020-06-15 LAB — TSH: TSH: 0.35 mIU/L — ABNORMAL LOW (ref 0.40–4.50)

## 2020-06-15 LAB — COMPLETE METABOLIC PANEL WITH GFR
AG Ratio: 2.1 (calc) (ref 1.0–2.5)
ALT: 18 U/L (ref 6–29)
AST: 16 U/L (ref 10–35)
Albumin: 4.7 g/dL (ref 3.6–5.1)
Alkaline phosphatase (APISO): 83 U/L (ref 37–153)
BUN: 15 mg/dL (ref 7–25)
CO2: 29 mmol/L (ref 20–32)
Calcium: 9.5 mg/dL (ref 8.6–10.4)
Chloride: 104 mmol/L (ref 98–110)
Creat: 0.75 mg/dL (ref 0.50–0.99)
GFR, Est African American: 96 mL/min/{1.73_m2} (ref >=60)
GFR, Est Non African American: 82 mL/min/{1.73_m2} (ref >=60)
Globulin: 2.2 g/dL (calc) (ref 1.9–3.7)
Glucose, Bld: 101 mg/dL — ABNORMAL HIGH (ref 65–99)
Potassium: 3.6 mmol/L (ref 3.5–5.3)
Sodium: 142 mmol/L (ref 135–146)
Total Bilirubin: 0.5 mg/dL (ref 0.2–1.2)
Total Protein: 6.9 g/dL (ref 6.1–8.1)

## 2020-06-15 LAB — LIPASE: Lipase: 17 U/L (ref 7–60)

## 2020-06-21 ENCOUNTER — Telehealth: Payer: Self-pay

## 2020-06-21 NOTE — Telephone Encounter (Signed)
Called patient to go over directions on how to take her SYNTHROID, directions were given again. Patient was happy with that. Patient did want to report HAIR loss that started about 2wks ago

## 2020-06-21 NOTE — Telephone Encounter (Signed)
Hair loss could be from the thyroid level being abnormal. If not better in 1 month than make an office visit to check other labs.  Estill Bamberg

## 2020-06-22 ENCOUNTER — Encounter: Payer: Self-pay | Admitting: Internal Medicine

## 2020-06-22 ENCOUNTER — Other Ambulatory Visit: Payer: Self-pay | Admitting: Physician Assistant

## 2020-06-22 DIAGNOSIS — L089 Local infection of the skin and subcutaneous tissue, unspecified: Secondary | ICD-10-CM | POA: Insufficient documentation

## 2020-06-22 DIAGNOSIS — J3489 Other specified disorders of nose and nasal sinuses: Secondary | ICD-10-CM

## 2020-06-23 NOTE — Telephone Encounter (Signed)
Lvm to inform pt

## 2020-06-24 ENCOUNTER — Encounter: Payer: Self-pay | Admitting: Internal Medicine

## 2020-07-05 ENCOUNTER — Ambulatory Visit
Admission: RE | Admit: 2020-07-05 | Discharge: 2020-07-05 | Disposition: A | Discharge status: Home / Self Care | Payer: Medicare Other | Source: Ambulatory Visit | Attending: Physician Assistant | Admitting: Physician Assistant

## 2020-07-05 DIAGNOSIS — R140 Abdominal distension (gaseous): Secondary | ICD-10-CM

## 2020-07-05 DIAGNOSIS — N281 Cyst of kidney, acquired: Secondary | ICD-10-CM | POA: Diagnosis not present

## 2020-07-05 DIAGNOSIS — I700 Atherosclerosis of aorta: Secondary | ICD-10-CM | POA: Diagnosis not present

## 2020-07-05 DIAGNOSIS — K449 Diaphragmatic hernia without obstruction or gangrene: Secondary | ICD-10-CM | POA: Diagnosis not present

## 2020-07-05 MED ORDER — IOPAMIDOL (ISOVUE-300) INJECTION 61%
100.0000 mL | Freq: Once | INTRAVENOUS | Status: AC | PRN
  Administered 2020-07-05: 100 mL via INTRAVENOUS

## 2020-07-12 ENCOUNTER — Other Ambulatory Visit: Payer: Self-pay | Admitting: Internal Medicine

## 2020-07-12 DIAGNOSIS — I700 Atherosclerosis of aorta: Secondary | ICD-10-CM

## 2020-07-12 NOTE — Progress Notes (Signed)
========================================================== ==========================================================  -    CTscan of Abd ordered by Estill Bamberg looks great   - No sign of any internal organ abnormalities or cancers   - So continue with Nexium ordered by Estill Bamberg til see Dr Hilarie Fredrickson.  ========================================================== ==========================================================

## 2020-07-22 DIAGNOSIS — F330 Major depressive disorder, recurrent, mild: Secondary | ICD-10-CM | POA: Diagnosis not present

## 2020-07-28 ENCOUNTER — Ambulatory Visit (INDEPENDENT_AMBULATORY_CARE_PROVIDER_SITE_OTHER): Payer: Medicare Other | Admitting: Internal Medicine

## 2020-07-28 ENCOUNTER — Encounter: Payer: Self-pay | Admitting: Internal Medicine

## 2020-07-28 ENCOUNTER — Other Ambulatory Visit: Payer: Self-pay

## 2020-07-28 VITALS — BP 90/64 | HR 94 | Temp 97.0°F | Resp 16 | Ht 64.0 in | Wt 135.0 lb

## 2020-07-28 DIAGNOSIS — F17200 Nicotine dependence, unspecified, uncomplicated: Secondary | ICD-10-CM | POA: Diagnosis not present

## 2020-07-28 DIAGNOSIS — R7309 Other abnormal glucose: Secondary | ICD-10-CM

## 2020-07-28 DIAGNOSIS — E559 Vitamin D deficiency, unspecified: Secondary | ICD-10-CM | POA: Diagnosis not present

## 2020-07-28 DIAGNOSIS — Z8249 Family history of ischemic heart disease and other diseases of the circulatory system: Secondary | ICD-10-CM | POA: Diagnosis not present

## 2020-07-28 DIAGNOSIS — E782 Mixed hyperlipidemia: Secondary | ICD-10-CM | POA: Diagnosis not present

## 2020-07-28 DIAGNOSIS — R7303 Prediabetes: Secondary | ICD-10-CM | POA: Diagnosis not present

## 2020-07-28 DIAGNOSIS — Z79899 Other long term (current) drug therapy: Secondary | ICD-10-CM

## 2020-07-28 DIAGNOSIS — E079 Disorder of thyroid, unspecified: Secondary | ICD-10-CM

## 2020-07-28 DIAGNOSIS — K2100 Gastro-esophageal reflux disease with esophagitis, without bleeding: Secondary | ICD-10-CM

## 2020-07-28 DIAGNOSIS — Z1211 Encounter for screening for malignant neoplasm of colon: Secondary | ICD-10-CM

## 2020-07-28 DIAGNOSIS — J449 Chronic obstructive pulmonary disease, unspecified: Secondary | ICD-10-CM

## 2020-07-28 DIAGNOSIS — I700 Atherosclerosis of aorta: Secondary | ICD-10-CM | POA: Diagnosis not present

## 2020-07-28 DIAGNOSIS — I10 Essential (primary) hypertension: Secondary | ICD-10-CM

## 2020-07-28 DIAGNOSIS — Z136 Encounter for screening for cardiovascular disorders: Secondary | ICD-10-CM

## 2020-07-28 DIAGNOSIS — F3341 Major depressive disorder, recurrent, in partial remission: Secondary | ICD-10-CM

## 2020-07-28 DIAGNOSIS — Z23 Encounter for immunization: Secondary | ICD-10-CM | POA: Diagnosis not present

## 2020-07-28 NOTE — Patient Instructions (Signed)

## 2020-07-28 NOTE — Progress Notes (Signed)
Comprehensive Evaluation &  Examination      This very nice 69 y.o.  DWF  presents for a  comprehensive evaluation and management of multiple medical co-morbidities.  Patient has been followed for HTN, HLD, Prediabetes, Hypothyroid, Depression and Vitamin D Deficiency. Patient has hx/o Aortic Atherosclerosis by CT scan Oct 2021. Patient also has COPD consequent of long standing smoking and refuses to consider options to quit smoking.      Patient is on SS Disability since the 1990's for a hx/o severe Depression with hx/o ECT x 9 in 2008. She is followed by Dr Toy Care. 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         HTN predates circa 2008. Patient's BP has been controlled on Bumex  at home and patient denies any cardiac symptoms as chest pain, palpitations, shortness of breath, dizziness or ankle swelling. Today's BP: 90/64       Patient's hyperlipidemia is controlled with diet and AStorvastatin. Patient denies myalgias or other medication SE's. Last lipids were at goal except slightly elevated Trig's:  Lab Results  Component Value Date   CHOL 150 01/06/2020   HDL 53 01/06/2020   LDLCALC 72 01/06/2020   TRIG 170 (H) 01/06/2020   CHOLHDL 2.8 01/06/2020        Patient has hx/o prediabetes (A1c 5.7% /2012) and patient denies reactive hypoglycemic symptoms, visual blurring, diabetic polys or paresthesias. Last A1c was near goal:  Lab Results  Component Value Date   HGBA1C 5.7 (H) 01/06/2020       Patient was dx'd Hypothyroid in 2010 and was started on Thyroid Replacement.        Finally, patient has history of Vitamin D Deficiency ("36" /2008) and last Vitamin D was at goal:  Lab Results  Component Value Date   VD25OH 85 01/06/2020    Current Outpatient Medications  on File Prior to Visit  Medication Sig  . Albuterol HFA inhaler Use 2 inhalations 15 minutes every  4 hours to rescue Asthma  . ALPRAZolam  1 MG tablet take 1/2 to 1 tablets    Daily for anxiety  . atorvastatin 80 MG tablet TAKE 1/2 TO 1 TABLET EVERY DAY  . LATISSE ophth solun 1 drop  along the skin of the eyelid at bedtime  . bumetanide  2 MG tablet Take 1 tablet 2 x /day   . buPROPion-XL  Take 150 mg 3  times daily.   Marland Kitchen VITAMIN D  Take 5,000 Units  daily.  Marland Kitchen esomeprazole 40 MG caps TAKE ONE CAP EVERY MORNING   . famotidine  40 MG tab TAKE ONE TAB EVERY NIGHT   . FLAX SEED OIL 1300 MG Take 1,300 mg3 (three) times daily.  Marland Kitchen levothyroxine  50 MCG  Take 1.5  tablet   . meloxicam 15 MG Take 1/2 to 1 tablet Daily  . MultiVit Take 1 tablet  daily.  . mupirocin ointment 2 % Apply to Skin Infection 2 x /day  (Dx - L08.9)  . nystatin cream  Apply 1 application topically 2 (two) times daily.  . Omega-3 FISH OIL 1000 MG  Take 1 capsule 3 times daily.  . potassium chloride 20 MEQ Take 1 tablet  3  times daily.  Marland Kitchen topiramate  100  MG  Take 1 tablet 2 x /day at Suppertime & Bedtime for Migraine Prevention  . triamcinolone crm 0.5 % Apply 1 application topically 2 (two) times daily.  . valACYclovir 500 MG Take 2 tablets 2 x /day at Onset of Symptoms     Allergies  Allergen Reactions  . Codeine Other (See Comments)    Abdominal pain  . Erythromycin Base     GI upset  . Penicillins Rash    Past Medical History:  Diagnosis Date  . Allergy   . Anxiety   . Arthritis   . Asthma    as a child, out grew   . Blood transfusion without reported diagnosis   . Cancer ([place])    basal cell left temple, left side of nose  . Depression   . GERD (gastroesophageal reflux disease)   . Hyperlipidemia   . Hypertension   . Thyroid disease    hypo  . Tubular adenoma of colon    Health Maintenance  Topic Date Due  . Hepatitis C Screening  Never done  . MAMMOGRAM  03/31/2012  . COLONOSCOPY  04/13/2017    . DEXA SCAN  Never done  . PNA vac Low Risk Adult (2 of 2 - PPSV23) 12/03/2019  . INFLUENZA VACCINE  04/25/2020  . TETANUS/TDAP  11/30/2025  . COVID-19 Vaccine  Completed   Immunization History  Administered Date(s) Administered  . Influenza Split 06/22/2015  . Influenza, High Dose Seasonal PF 07/16/2018, 07/02/2019  . Influenza,inj,Quad PF,6+ Mos 07/22/2017  . Influenza,inj,quad, With Preservative 07/05/2016  . Influenza-Unspecified 07/22/2017  . PFIZER SARS-COV-2 Vaccination 11/01/2019, 11/22/2019  . Pneumococcal Conjugate-13 12/03/2018  . Pneumococcal-Unspecified 09/25/2004  . Td 09/25/2004  . Tdap 12/01/2015    Last Colon - 04/13/2014 - Dr Hilarie Fredrickson - recc 3 yr f/u due July 2018 - Patient aware overdue. Patient has upcoming appt with Dr Hilarie Fredrickson on Nov 22.  Last MGM - 03/31/2010 - s/p Bilat Mastectomy for Fibrocystic Breast Dz in 1972  Past Surgical History:  Procedure Laterality Date  . APPENDECTOMY    . bil breast lumpectomy    . bilateral breast masectomy  no breast cancer   bil breast removed to prevent ca.  Marland Kitchen BREAST RECONSTRUCTION  2008  . BREAST SURGERY Bilateral 1983   implants  . CHOLECYSTECTOMY  1975  . EUS N/A 04/23/2014   Procedure: LOWER ENDOSCOPIC ULTRASOUND (EUS);  Surgeon: Milus Banister, MD;  Location: [facility];  Service: Endoscopy;  Laterality: N/A;  . LIPOSUCTION  1998   with tummy tuck  . NASAL SINUS SURGERY  1996   Family History  Problem Relation Age of Onset  . COPD Mother   . Lung cancer Mother   . Emphysema Mother   . Depression Mother   . Dementia Mother   . Early death Father        murdered at 1  . Alcohol abuse Father   . Early death Sister        suicide at 77  . Anxiety disorder Sister   . Depression Sister   . Schizophrenia Sister   . Diabetes Sister   . Alcohol abuse Paternal Uncle   . ADD / ADHD Son   . Colon cancer Neg Hx   . Esophageal cancer Neg Hx   . Pancreatic cancer Neg Hx   . Rectal cancer Neg Hx   . Stomach  cancer Neg Hx    Social History   Tobacco Use  . Smoking status: Current Some  Day Smoker    Packs/day: 0.50    Types: E-cigarettes  . Smokeless tobacco: Never Used  . Tobacco comment: Uses E-Cigs and trying to quit completely   Substance Use Topics  . Alcohol use: Yes    Alcohol/week: 4.0 - 5.0 standard drinks    Types: 4 - 5 Shots of liquor per week    Comment: occasional  . Drug use: No    ROS Constitutional: Denies fever, chills, weight loss/gain, headaches, insomnia,  night sweats, and change in appetite. Does c/o fatigue. Eyes: Denies redness, blurred vision, diplopia, discharge, itchy, watery eyes.  ENT: Denies discharge, congestion, post nasal drip, epistaxis, sore throat, earache, hearing loss, dental pain, Tinnitus, Vertigo, Sinus pain, snoring.  Cardio: Denies chest pain, palpitations, irregular heartbeat, syncope, dyspnea, diaphoresis, orthopnea, PND, claudication, edema Respiratory: denies cough, dyspnea, DOE, pleurisy, hoarseness, laryngitis, wheezing.  Gastrointestinal: Denies dysphagia, heartburn, reflux, water brash, pain, cramps, nausea, vomiting, bloating, diarrhea, constipation, hematemesis, melena, hematochezia, jaundice, hemorrhoids Genitourinary: Denies dysuria, frequency, urgency, nocturia, hesitancy, discharge, hematuria, flank pain Breast: Breast lumps, nipple discharge, bleeding.  Musculoskeletal: Denies arthralgia, myalgia, stiffness, Jt. Swelling, pain, limp, and strain/sprain. Denies falls. Skin: Denies puritis, rash, hives, warts, acne, eczema, changing in skin lesion Neuro: No weakness, tremor, incoordination, spasms, paresthesia, pain Psychiatric: Denies confusion, memory loss, sensory loss. Denies Depression. Endocrine: Denies change in weight, skin, hair change, nocturia, and paresthesia, diabetic polys, visual blurring, hyper / hypo glycemic episodes.  Heme/Lymph: No excessive bleeding, bruising, enlarged lymph nodes.  Physical Exam  BP 90/64    Pulse 94   Temp (!) 97 F (36.1 C)   Resp 16   Ht 5\' 4"  (1.626 m)   Wt 135 lb (61.2 kg)   SpO2 97%   BMI 23.17 kg/m   General Appearance: Well nourished, well groomed and in no apparent distress.  Eyes: PERRLA, EOMs, conjunctiva no swelling or erythema, normal fundi and vessels. Sinuses: No frontal/maxillary tenderness ENT/Mouth: EACs patent / TMs  nl. Nares clear without erythema, swelling, mucoid exudates. Oral hygiene is good. No erythema, swelling, or exudate. Tongue normal, non-obstructing. Tonsils not swollen or erythematous. Hearing normal.  Neck: Supple, thyroid not palpable. No bruits, nodes or JVD. Respiratory: Respiratory effort normal.  BS equal and clear bilateral without rales, rhonci, wheezing or stridor. Cardio: Heart sounds are normal with regular rate and rhythm and no murmurs, rubs or gallops. Peripheral pulses are normal and equal bilaterally without edema. No aortic or femoral bruits. Chest: symmetric with normal excursions and percussion. Breasts: Symmetric, without lumps, nipple discharge, retractions, or fibrocystic changes.  Abdomen: Flat, soft with bowel sounds active. Nontender, no guarding, rebound, hernias, masses, or organomegaly.  Lymphatics: Non tender without lymphadenopathy.  Musculoskeletal: Full ROM all peripheral extremities, joint stability, 5/5 strength, and normal gait. Skin: Warm and dry without rashes, lesions, cyanosis, clubbing or  ecchymosis.  Neuro: Cranial nerves intact, reflexes equal bilaterally. Normal muscle tone, no cerebellar symptoms. Sensation intact.  Pysch: Alert and oriented X 3, normal affect, Insight and Judgment appropriate.   Assessment and Plan  1. Essential hypertension  - EKG 12-Lead - Urinalysis, Routine w reflex microscopic - Microalbumin / creatinine urine ratio - CBC with Differential/Platelet - COMPLETE METABOLIC PANEL WITH GFR - Magnesium - TSH  2. Hyperlipidemia, mixed  - EKG 12-Lead - Lipid panel -  TSH  3. Abnormal glucose  - EKG 12-Lead - Hemoglobin A1c - Insulin, random  4. Vitamin D deficiency  - VITAMIN D 25 Hydroxy   5. Prediabetes  -  EKG 12-Lead - Hemoglobin A1c - Insulin, random  6. Aortic atherosclerosis ([place]) by Abd CT scan 10.11.2021  - EKG 12-Lead - Lipid panel  7. Gastroesophageal reflux disease with esophagitis  - CBC with Differential/Platelet  8. Depression, major, recurrent, in partial remission ([place])   9. Chronic obstructive pulmonary disease ([place])   10. Hypothyroidism  - TSH  11. Screening for colorectal cancer  - POC Hemoccult Bld/Stl   12. Screening for ischemic heart disease  - EKG 12-Lead  13. FHx: heart disease  - EKG 12-Lead  14. Smoker  - EKG 12-Lead  15. Medication management  - Urinalysis, Routine w reflex microscopic - Microalbumin / creatinine urine ratio - CBC with Differential/Platelet - COMPLETE METABOLIC PANEL WITH GFR - Magnesium - Lipid panel - TSH - Hemoglobin A1c - Insulin, random - VITAMIN D 25 Hydroxy          Patient was counseled in prudent diet to achieve/maintain BMI less than 25 for weight control, BP monitoring, regular exercise and medications. Discussed med's effects and SE's. Screening labs and tests as requested with regular follow-up as recommended. Over 40 minutes of exam, counseling, chart review and high complex critical decision making was performed.   Kirtland Bouchard, MD

## 2020-07-29 ENCOUNTER — Other Ambulatory Visit: Payer: Self-pay | Admitting: Internal Medicine

## 2020-07-29 DIAGNOSIS — D649 Anemia, unspecified: Secondary | ICD-10-CM

## 2020-07-29 DIAGNOSIS — D509 Iron deficiency anemia, unspecified: Secondary | ICD-10-CM

## 2020-07-29 DIAGNOSIS — D519 Vitamin B12 deficiency anemia, unspecified: Secondary | ICD-10-CM

## 2020-07-29 LAB — COMPLETE METABOLIC PANEL WITH GFR
AG Ratio: 2.2 (calc) (ref 1.0–2.5)
ALT: 15 U/L (ref 6–29)
AST: 12 U/L (ref 10–35)
Albumin: 4.2 g/dL (ref 3.6–5.1)
Alkaline phosphatase (APISO): 86 U/L (ref 37–153)
BUN: 23 mg/dL (ref 7–25)
CO2: 27 mmol/L (ref 20–32)
Calcium: 9.5 mg/dL (ref 8.6–10.4)
Chloride: 104 mmol/L (ref 98–110)
Creat: 0.78 mg/dL (ref 0.50–0.99)
GFR, Est African American: 91 mL/min/{1.73_m2} (ref >=60)
GFR, Est Non African American: 79 mL/min/{1.73_m2} (ref >=60)
Globulin: 1.9 g/dL (calc) (ref 1.9–3.7)
Glucose, Bld: 93 mg/dL (ref 65–99)
Potassium: 4.3 mmol/L (ref 3.5–5.3)
Sodium: 140 mmol/L (ref 135–146)
Total Bilirubin: 0.4 mg/dL (ref 0.2–1.2)
Total Protein: 6.1 g/dL (ref 6.1–8.1)

## 2020-07-29 LAB — URINALYSIS, ROUTINE W REFLEX MICROSCOPIC
Bacteria, UA: NONE SEEN /HPF
Bilirubin Urine: NEGATIVE
Glucose, UA: NEGATIVE
Hgb urine dipstick: NEGATIVE
Hyaline Cast: NONE SEEN /LPF
Ketones, ur: NEGATIVE
Nitrite: NEGATIVE
Protein, ur: NEGATIVE
RBC / HPF: NONE SEEN /HPF (ref 0–2)
Specific Gravity, Urine: 1.018 (ref 1.001–1.03)
pH: 8 (ref 5.0–8.0)

## 2020-07-29 LAB — CBC WITH DIFFERENTIAL/PLATELET
Absolute Monocytes: 431 cells/uL (ref 200–950)
Basophils Absolute: 73 cells/uL (ref 0–200)
Basophils Relative: 1 %
Eosinophils Absolute: 241 cells/uL (ref 15–500)
Eosinophils Relative: 3.3 %
HCT: 35.7 % (ref 35.0–45.0)
Hemoglobin: 11.6 g/dL — ABNORMAL LOW (ref 11.7–15.5)
Lymphs Abs: 3241 cells/uL (ref 850–3900)
MCH: 30.9 pg (ref 27.0–33.0)
MCHC: 32.5 g/dL (ref 32.0–36.0)
MCV: 94.9 fL (ref 80.0–100.0)
MPV: 10.8 fL (ref 7.5–12.5)
Monocytes Relative: 5.9 %
Neutro Abs: 3314 cells/uL (ref 1500–7800)
Neutrophils Relative %: 45.4 %
Platelets: 337 10*3/uL (ref 140–400)
RBC: 3.76 10*6/uL — ABNORMAL LOW (ref 3.80–5.10)
RDW: 12.4 % (ref 11.0–15.0)
Total Lymphocyte: 44.4 %
WBC: 7.3 10*3/uL (ref 3.8–10.8)

## 2020-07-29 LAB — MAGNESIUM: Magnesium: 2.3 mg/dL (ref 1.5–2.5)

## 2020-07-29 LAB — INSULIN, RANDOM: Insulin: 4.1 u[IU]/mL

## 2020-07-29 LAB — HEMOGLOBIN A1C
Hgb A1c MFr Bld: 5.7 % of total Hgb — ABNORMAL HIGH (ref <5.7)
Mean Plasma Glucose: 117 (calc)
eAG (mmol/L): 6.5 (calc)

## 2020-07-29 LAB — LIPID PANEL
Cholesterol: 162 mg/dL (ref <200)
HDL: 51 mg/dL (ref >=50)
LDL Cholesterol (Calc): 87 mg/dL (calc)
Non-HDL Cholesterol (Calc): 111 mg/dL (calc) (ref <130)
Total CHOL/HDL Ratio: 3.2 (calc) (ref <5.0)
Triglycerides: 147 mg/dL (ref <150)

## 2020-07-29 LAB — MICROALBUMIN / CREATININE URINE RATIO
Creatinine, Urine: 107 mg/dL (ref 20–275)
Microalb Creat Ratio: 4 mcg/mg creat (ref <30)
Microalb, Ur: 0.4 mg/dL

## 2020-07-29 LAB — VITAMIN D 25 HYDROXY (VIT D DEFICIENCY, FRACTURES): Vit D, 25-Hydroxy: 86 ng/mL (ref 30–100)

## 2020-07-29 LAB — TSH: TSH: 2.44 mIU/L (ref 0.40–4.50)

## 2020-07-31 ENCOUNTER — Encounter: Payer: Self-pay | Admitting: Internal Medicine

## 2020-08-03 NOTE — Progress Notes (Signed)
PATIENT IS AWARE OF LAB RESULTS AND 1 MONTH NV TO RE-CHECK ANEMIA HAS BEEN SCHEDULED. Margaret Nelson [facility]

## 2020-08-05 ENCOUNTER — Other Ambulatory Visit: Payer: Self-pay | Admitting: Internal Medicine

## 2020-08-05 MED ORDER — LEVOTHYROXINE SODIUM 50 MCG PO TABS
ORAL_TABLET | ORAL | 3 refills | Status: DC
Start: 2020-08-05 — End: 2020-12-14

## 2020-08-09 ENCOUNTER — Other Ambulatory Visit: Payer: Self-pay | Admitting: Internal Medicine

## 2020-08-09 ENCOUNTER — Ambulatory Visit: Payer: Medicare Other | Admitting: Internal Medicine

## 2020-08-09 DIAGNOSIS — R0789 Other chest pain: Secondary | ICD-10-CM

## 2020-08-16 ENCOUNTER — Ambulatory Visit: Payer: Medicare Other | Admitting: Internal Medicine

## 2020-08-21 ENCOUNTER — Other Ambulatory Visit: Payer: Self-pay | Admitting: Internal Medicine

## 2020-08-21 DIAGNOSIS — R609 Edema, unspecified: Secondary | ICD-10-CM

## 2020-08-21 DIAGNOSIS — I10 Essential (primary) hypertension: Secondary | ICD-10-CM

## 2020-08-22 ENCOUNTER — Other Ambulatory Visit: Payer: Self-pay | Admitting: Internal Medicine

## 2020-08-22 MED ORDER — TOPIRAMATE 100 MG PO TABS
ORAL_TABLET | ORAL | 1 refills | Status: DC
Start: 2020-08-22 — End: 2020-08-22

## 2020-08-22 MED ORDER — TOPIRAMATE 100 MG PO TABS: ORAL_TABLET | ORAL | 1 refills | Status: DC

## 2020-09-01 ENCOUNTER — Ambulatory Visit: Payer: Medicare Other

## 2020-09-01 ENCOUNTER — Other Ambulatory Visit: Payer: Self-pay | Admitting: Internal Medicine

## 2020-09-01 DIAGNOSIS — F3341 Major depressive disorder, recurrent, in partial remission: Secondary | ICD-10-CM

## 2020-10-04 ENCOUNTER — Other Ambulatory Visit: Payer: Self-pay | Admitting: Internal Medicine

## 2020-10-04 DIAGNOSIS — Z23 Encounter for immunization: Secondary | ICD-10-CM | POA: Diagnosis not present

## 2020-10-04 DIAGNOSIS — K2100 Gastro-esophageal reflux disease with esophagitis, without bleeding: Secondary | ICD-10-CM

## 2020-10-04 MED ORDER — ESOMEPRAZOLE MAGNESIUM 40 MG PO CPDR: DELAYED_RELEASE_CAPSULE | ORAL | 0 refills | Status: DC

## 2020-10-05 ENCOUNTER — Ambulatory Visit: Payer: Medicare Other | Admitting: Internal Medicine

## 2020-10-06 ENCOUNTER — Other Ambulatory Visit: Payer: Self-pay | Admitting: *Deleted

## 2020-10-06 DIAGNOSIS — K2100 Gastro-esophageal reflux disease with esophagitis, without bleeding: Secondary | ICD-10-CM

## 2020-10-06 MED ORDER — ESOMEPRAZOLE MAGNESIUM 40 MG PO CPDR: DELAYED_RELEASE_CAPSULE | ORAL | 0 refills | Status: DC

## 2020-10-07 ENCOUNTER — Encounter: Payer: Self-pay | Admitting: *Deleted

## 2020-10-09 ENCOUNTER — Other Ambulatory Visit: Payer: Self-pay | Admitting: Internal Medicine

## 2020-10-09 MED ORDER — PANTOPRAZOLE SODIUM 40 MG PO TBEC: DELAYED_RELEASE_TABLET | ORAL | 0 refills | Status: DC

## 2020-10-11 ENCOUNTER — Ambulatory Visit: Payer: Medicare Other | Admitting: Internal Medicine

## 2020-10-18 ENCOUNTER — Other Ambulatory Visit: Payer: Self-pay | Admitting: Adult Health

## 2020-10-18 DIAGNOSIS — I10 Essential (primary) hypertension: Secondary | ICD-10-CM

## 2020-11-01 ENCOUNTER — Other Ambulatory Visit: Payer: Self-pay | Admitting: Adult Health

## 2020-11-01 DIAGNOSIS — R0789 Other chest pain: Secondary | ICD-10-CM

## 2020-11-12 NOTE — Progress Notes (Signed)
3 MONTH FOLLOW UP  Assessment:    Essential hypertension Discussed dietary and exercise modifications -     CBC with Differential/Platelet -     COMPLETE METABOLIC PANEL WITH GFR  Extrinsic asthma without complication, unspecified asthma severity, unspecified whether persistent Has rescue inhaler Continue Breo daily  STOP SMOKING  Gastroesophageal reflux disease, esophagitis presence not specified Well managed on current medications Discussed diet, avoiding triggers and other lifestyle changes  Hypothyroidism continue medications the same pending lab results reminded to take on an empty stomach 30-86mins before food.  -     TSH  Depression, major, recurrent, in partial remission (HCC) Taking Wellbutrin and lexapro Dr. Harriet Pho managing; overdue follow up; encouraged to schedule  Hyperlipidemia, mixed Continue medications Continue low cholesterol diet and exercise.  Check lipid panel.  -     Lipid panel  Vitamin D deficiency -     VITAMIN D 25 Hydroxy (Vit-D Deficiency, Fractures)  Abnormal glucose Discussed dietary and exercise modifications -     Hemoglobin A1c  Smoker Discussed smoking cessation and resources Not ready to quit at this time Will continue to assess readiness  -lung cancer screening with low dose CT discussed as recommended by guidelines based on age, number of pack year history.  Discussed risks of screening including but not limited to false positives on xray, further testing or consultation with specialist, and possible false negative CT as well. Understanding expressed and wishes to proceed with CT testing. Order placed.   Body mass index (BMI) of 23.0-23.9 in adult Discussed dietary and exercise modifications  Medication management -     CBC with Differential/Platelet -     COMPLETE METABOLIC PANEL WITH GFR -     Magnesium  Hair loss Non-scarring, general distribution Check TSH, CBC, iron; increased stress may be contributing Suggested  hair skin nails supplement  Over 40 minutes of exam, counseling, chart review and critical decision making was performed Future Appointments  Date Time Provider [place]  02/15/2021  2:30 PM Unk Pinto, MD [facility] None  08/10/2021  2:00 PM Unk Pinto, MD [facility] None    Subjective:  Margaret Nelson is a 70 y.o. female who presents for 3 month follow up.   Patient is on SS disability for a long hx/o severe Depression circa 1990's w/hx/o ECT x 9 in 2008. She is seeing Dr Jeneen Montgomery.  She was also at one time in a long term toxic relationship with a bipolar Manic Depressive- She is now living with her daughter and not in the relationship. Prescribed lexapro 10 mg, wellbutrin 150 mg TID, PRN Xanax.   Current some day smoker, hx of asthma, had emphysematous changes noted in lung bases per CT abd 06/2020. She has 30+ pack year history on review, smoking since she was 27, currently intermittently. Has never had Ct lung cancer screening.   BMI is Body mass index is 23.86 kg/m., she is working on diet and exercise. Has lost weight coming off the elavil. Wt Readings from Last 3 Encounters:  11/15/20 139 lb (63 kg)  07/28/20 135 lb (61.2 kg)  06/14/20 129 lb (58.5 kg)   She has had elevated blood pressure since 2008. Her blood pressure has been controlled at home, today their BP is BP: 110/74 She does not workout. She denies chest pain, shortness of breath, dizziness.   She has aortic atherosclerosis per CT 06/2020.   She is on cholesterol medication and denies myalgias. Her cholesterol is not at goal. The cholesterol last  visit was:   Lab Results  Component Value Date   CHOL 162 07/28/2020   HDL 51 07/28/2020   LDLCALC 87 07/28/2020   TRIG 147 07/28/2020   CHOLHDL 3.2 07/28/2020   She has had pre diabetes with elevated A1c of 5.7 in 2012 and highest at 5.9 in 2016. She has not been working on diet and exercise for prediabetes/abnormal glucose, and denies  hyperglycemia, hypoglycemia , nausea, polydipsia and polyuria. Last A1C in the office was:  Lab Results  Component Value Date   HGBA1C 5.7 (H) 07/28/2020   Last GFR: Lab Results  Component Value Date   GFRNONAA 79 07/28/2020   Patient is on Vitamin D supplement, taking 5000 IU   Lab Results  Component Value Date   VD25OH 61 07/28/2020     She is on thyroid medication. Her medication was not changed last visit.   Lab Results  Component Value Date   TSH 2.44 07/28/2020  .    She reports taking 65 mg slow release iron recently due to hair loss concerns Lab Results  Component Value Date   IRON 106 04/21/2019   TIBC 342 04/21/2019   FERRITIN 127 04/21/2019   Lab Results  Component Value Date   WBC 7.3 07/28/2020   HGB 11.6 (L) 07/28/2020   HCT 35.7 07/28/2020   MCV 94.9 07/28/2020   PLT 337 07/28/2020    Lab Results  Component Value Date   VITAMINB12 641 07/02/2019     Medication Review:  Current Outpatient Medications (Endocrine & Metabolic):  .  levothyroxine (SYNTHROID) 50 MCG tablet, Take 1.5  tablet daily on an empty stomach with only water for 30 minutes & no Antacid meds, Calcium or Magnesium for 4 hours & avoid Biotin  Current Outpatient Medications (Cardiovascular):  .  atorvastatin (LIPITOR) 80 MG tablet, TAKE 1/2 TO 1 TABLET BY MOUTH EVERY DAY AS NEEDED FOR CHOLESTEROL .  bumetanide (BUMEX) 2 MG tablet, Take       1 tablet       2 x /day       for BP & Fluid Retention /Ankle Swelling  Current Outpatient Medications (Respiratory):  .  albuterol (PROAIR HFA) 108 (90 Base) MCG/ACT inhaler, Use 2 inhalations    15 minutes   every 4 hours      to rescue Asthma  Current Outpatient Medications (Analgesics):  .  meloxicam (MOBIC) 15 MG tablet, TAKE 1/2 TO 1 TABLET DAILY AS NEEDED WITH FOOD FOR PAIN & INFLAMMATION   Current Outpatient Medications (Other):  Marland Kitchen  ALPRAZolam (XANAX) 1 MG tablet, TAKE 1/2 TO 1 TABLETS DAILY FOR ANXIETY .  bimatoprost (LATISSE)  0.03 % ophthalmic solution, 1 drop on applicator, apply evenly along the skin of the upper eyelid at base of eyelashes at bedtime; repeat procedure for second eye .  buPROPion (WELLBUTRIN XL) 300 MG 24 hr tablet, take 1 tablet by mouth once daily (Patient taking differently: Takes 450mg  daily) .  Cholecalciferol (VITAMIN D PO), Take 5,000 Units by mouth daily. Marland Kitchen  escitalopram (LEXAPRO) 10 MG tablet, Take 10 mg by mouth daily. .  famotidine (PEPCID) 40 MG tablet, TAKE ONE TABLET BY MOUTH EVERY NIGHT AT BEDTIME .  Flaxseed, Linseed, (FLAX SEED OIL) 1300 MG CAPS, Take 1,300 mg by mouth 3 (three) times daily. .  Magnesium 500 MG TABS, Take by mouth. Takes 4 tablets daily. .  Multiple Vitamin (MULTIVITAMIN) tablet, Take 1 tablet by mouth daily. .  mupirocin ointment (BACTROBAN) 2 %, Apply  to Skin Infection 2 x /day  (Dx - L08.9) .  nystatin cream (MYCOSTATIN), Apply 1 application topically 2 (two) times daily. .  Omega-3 Fatty Acids (FISH OIL) 1000 MG CAPS, Take 1 capsule (1,000 mg total) by mouth 3 (three) times daily. .  pantoprazole (PROTONIX) 40 MG tablet, Take    1 tablet    Daily     to Prevent  Indigestion & Acid Reflux .  potassium chloride SA (KLOR-CON M20) 20 MEQ tablet, Take   1 tablet   3 x /day   for Potassium Deficiency .  triamcinolone cream (KENALOG) 0.5 %, Apply 1 application topically 2 (two) times daily. .  valACYclovir (VALTREX) 500 MG tablet, Take 2 tablets 2 x /day at Onset of Symptoms .  topiramate (TOPAMAX) 100 MG tablet, Take      1 tablet      2 x /day v     at Suppertime & Bedtime       for Migraine Prevention (Patient not taking: Reported on 11/15/2020)   Allergies  Allergen Reactions  . Codeine Other (See Comments)    Abdominal pain  . Erythromycin Base     GI upset  . Penicillins Rash    Current Problems (verified) Patient Active Problem List   Diagnosis Date Noted  . Aortic atherosclerosis ([place]) by Abd CT scan 10.11.2021 07/12/2020  . COPD (chronic obstructive  pulmonary disease) ([place]) 07/01/2019  . Abnormal glucose 05/21/2018  . Attention deficit hyperactivity disorder (ADHD) 05/21/2018  . Severe recurrent major depression without psychotic features ([place]) 09/17/2017    Class: Chronic  . Extrinsic asthma 07/29/2015  . Body mass index (BMI) of 23.0-23.9 in adult 07/29/2015  . Smoker 04/14/2015  . Gastroesophageal reflux disease 02/15/2015  . Medication management 02/12/2014  . Hypothyroidism   . Essential hypertension 08/18/2013  . Hyperlipidemia, mixed 08/18/2013  . Vitamin D deficiency 08/18/2013   SURGICAL HISTORY She  has a past surgical history that includes Cholecystectomy (1975); Nasal sinus surgery (1996); Liposuction (1998); Breast surgery (Bilateral, 1983); Breast reconstruction (2008); Appendectomy; bil breast lumpectomy; bilateral breast masectomy (no breast cancer); and EUS (N/A, 04/23/2014). FAMILY HISTORY Her family history includes ADD / ADHD in her son; Alcohol abuse in her father and paternal uncle; Anxiety disorder in her sister; COPD in her mother; Dementia in her mother; Depression in her mother and sister; Diabetes in her sister; Early death in her father and sister; Emphysema in her mother; Lung cancer in her mother; Schizophrenia in her sister. SOCIAL HISTORY She  reports that she has been smoking e-cigarettes. She has been smoking about 0.50 packs per day. She has never used smokeless tobacco. She reports current alcohol use of about 4.0 - 5.0 standard drinks of alcohol per week. She reports that she does not use drugs.  Review of Systems  Constitutional: Negative for malaise/fatigue and weight loss.  HENT: Negative for hearing loss and tinnitus.   Eyes: Negative for blurred vision and double vision.  Respiratory: Negative for cough, shortness of breath and wheezing.   Cardiovascular: Negative for chest pain, palpitations, orthopnea, claudication and leg swelling.  Gastrointestinal: Negative for abdominal pain, blood in  stool, constipation, diarrhea, heartburn, melena, nausea and vomiting.  Genitourinary: Negative.   Musculoskeletal: Negative for joint pain and myalgias.  Skin: Negative for rash.  Neurological: Negative for dizziness, tingling, sensory change, weakness and headaches.  Endo/Heme/Allergies: Negative for polydipsia.  Psychiatric/Behavioral: Positive for depression. Negative for substance abuse and suicidal ideas. The patient is nervous/anxious.  All other systems reviewed and are negative.    Objective:     Today's Vitals   11/15/20 1440  BP: 110/74  Pulse: 85  Temp: 97.9 F (36.6 C)  SpO2: 95%  Weight: 139 lb (63 kg)  Height: 5\' 4"  (1.626 m)   Body mass index is 23.86 kg/m.  General appearance: alert, no distress, WD/WN, female HEENT: left nasal passage swelling with erythema, and scabing normocephalic, sclerae anicteric, TMs pearly, nares patent, no discharge or erythema, pharynx normal Oral cavity: MMM, no lesions Neck: supple, no lymphadenopathy, no thyromegaly, no masses Heart: RRR, normal S1, S2, no murmurs Lungs: CTA bilaterally, no wheezes, rhonchi, or rales Abdomen: +bs, soft, non tender, non distended, no masses, no hepatomegaly, no splenomegaly Musculoskeletal: nontender, no swelling, no obvious deformity Extremities: no edema, no cyanosis, no clubbing Pulses: 2+ symmetric, upper and lower extremities, normal cap refill Neurological: alert, oriented x 3, CN2-12 intact, strength normal upper extremities and lower extremities, sensation normal throughout, DTRs 2+ throughout, no cerebellar signs, gait normal Psychiatric: normal affect, behavior normal, pleasant   Skin: warm/dry/intact without rashes; hair distribution with generalized thinning, no scalp abnormalities   Izora Ribas, NP   11/15/2020

## 2020-11-15 ENCOUNTER — Ambulatory Visit (INDEPENDENT_AMBULATORY_CARE_PROVIDER_SITE_OTHER): Payer: Medicare Other | Admitting: Adult Health

## 2020-11-15 ENCOUNTER — Other Ambulatory Visit: Payer: Self-pay

## 2020-11-15 ENCOUNTER — Encounter: Payer: Self-pay | Admitting: Adult Health

## 2020-11-15 VITALS — BP 110/74 | HR 85 | Temp 97.9°F | Ht 64.0 in | Wt 139.0 lb

## 2020-11-15 DIAGNOSIS — D649 Anemia, unspecified: Secondary | ICD-10-CM | POA: Diagnosis not present

## 2020-11-15 DIAGNOSIS — J449 Chronic obstructive pulmonary disease, unspecified: Secondary | ICD-10-CM

## 2020-11-15 DIAGNOSIS — Z79899 Other long term (current) drug therapy: Secondary | ICD-10-CM

## 2020-11-15 DIAGNOSIS — Z6823 Body mass index (BMI) 23.0-23.9, adult: Secondary | ICD-10-CM | POA: Diagnosis not present

## 2020-11-15 DIAGNOSIS — Z122 Encounter for screening for malignant neoplasm of respiratory organs: Secondary | ICD-10-CM

## 2020-11-15 DIAGNOSIS — Z87891 Personal history of nicotine dependence: Secondary | ICD-10-CM

## 2020-11-15 DIAGNOSIS — I700 Atherosclerosis of aorta: Secondary | ICD-10-CM

## 2020-11-15 DIAGNOSIS — E559 Vitamin D deficiency, unspecified: Secondary | ICD-10-CM | POA: Diagnosis not present

## 2020-11-15 DIAGNOSIS — I10 Essential (primary) hypertension: Secondary | ICD-10-CM

## 2020-11-15 DIAGNOSIS — F17200 Nicotine dependence, unspecified, uncomplicated: Secondary | ICD-10-CM

## 2020-11-15 DIAGNOSIS — K2100 Gastro-esophageal reflux disease with esophagitis, without bleeding: Secondary | ICD-10-CM | POA: Diagnosis not present

## 2020-11-15 DIAGNOSIS — E782 Mixed hyperlipidemia: Secondary | ICD-10-CM

## 2020-11-15 DIAGNOSIS — L659 Nonscarring hair loss, unspecified: Secondary | ICD-10-CM

## 2020-11-15 DIAGNOSIS — F332 Major depressive disorder, recurrent severe without psychotic features: Secondary | ICD-10-CM | POA: Diagnosis not present

## 2020-11-15 DIAGNOSIS — R21 Rash and other nonspecific skin eruption: Secondary | ICD-10-CM

## 2020-11-15 DIAGNOSIS — E538 Deficiency of other specified B group vitamins: Secondary | ICD-10-CM | POA: Diagnosis not present

## 2020-11-15 MED ORDER — FAMOTIDINE 40 MG PO TABS
ORAL_TABLET | ORAL | 0 refills | Status: DC
Start: 2020-11-15 — End: 2021-02-14

## 2020-11-15 MED ORDER — ATORVASTATIN CALCIUM 40 MG PO TABS: ORAL_TABLET | ORAL | 3 refills | Status: DC

## 2020-11-15 MED ORDER — NYSTATIN 100000 UNIT/GM EX CREA: 1.0000 "application " | TOPICAL_CREAM | Freq: Two times a day (BID) | CUTANEOUS | 1 refills | Status: DC

## 2020-11-15 NOTE — Patient Instructions (Addendum)
Get on hair skin nails supplement - B6 biotin, zinc - stay on for at least 6 months to see full benefit  Improve stress  Please follow up Dr. Wonda Horner  If hair loss not improving will send to derm   [place]  [phone] for more information or for a free program for smoking cessation help.   You can call QUIT SMART 1-800-QUIT-NOW for free nicotine patches or replacement therapy- if they are out- keep calling  [facility] Can call for smoking cessation classes, [phone]  If you have a smart phone, please look up Smoke Free app, this will help you stay on track and give you information about money you have saved, life that you have gained back and a ton of more information.     ADVANTAGES OF QUITTING SMOKING  Within 20 minutes, blood pressure decreases. Your pulse is at normal level.  After 8 hours, carbon monoxide levels in the blood return to normal. Your oxygen level increases.  After 24 hours, the chance of having a heart attack starts to decrease. Your breath, hair, and body stop smelling like smoke.  After 48 hours, damaged nerve endings begin to recover. Your sense of taste and smell improve.  After 72 hours, the body is virtually free of nicotine. Your bronchial tubes relax and breathing becomes easier.  After 2 to 12 weeks, lungs can hold more air. Exercise becomes easier and circulation improves.  After 1 year, the risk of coronary heart disease is cut in half.  After 5 years, the risk of stroke falls to the same as a nonsmoker.  After 10 years, the risk of lung cancer is cut in half and the risk of other cancers decreases significantly.  After 15 years, the risk of coronary heart disease drops, usually to the level of a nonsmoker.  You will have extra money to spend on things other than cigarettes.      Lung Cancer Screening A lung cancer screening is a test that checks for lung cancer. Lung cancer  screening is done to look for lung cancer in its very early stages when you are not likely to have any symptoms and before it spreads beyond the lung, making it harder to treat. Finding cancer early improves the chances of successful treatment. It may save your life. Who should have screening? You should be screened for lung cancer if all of these apply:  You currently smoke or you have quit smoking within the past 15 years.  You are 43-57 years old. Screening may be recommended up to age 7 depending on your overall health and other factors.  You are in good general health.  You have a smoking history of 1 pack of cigarettes a day for 20 years or 2 packs a day for 10 years. Screening may also be recommended if you are at high risk for the disease. You may be at high risk if:  You have a family history of lung cancer.  You have been exposed to asbestos or radon.  You have chronic obstructive pulmonary disease (COPD). How is screening done? The recommended screening test is a low-dose computed tomography (LDCT) scan. This scan takes detailed images of the lungs. This allows a health care provider to look for abnormal cells. If you are at risk for lung cancer, it is recommended that you get screened once a year. Talk to your health care provider about the risks, benefits, and limitations of  screening.   What are the benefits of screening? Screening can find lung cancer early, before symptoms start and before it has spread outside of the lungs. The chances of curing lung cancer are greater if the cancer is diagnosed early. What are the risks of screening?  The screening may show lung cancer when no cancer is present (false-positive result).  The screening may not find lung cancer when it is present.  The person gets exposed to radiation. How can I lower my risk of lung cancer? Make these lifestyle changes to lower your risk of developing lung cancer:  Do not use any products that contain  nicotine or tobacco, such as cigarettes, e-cigarettes, and chewing tobacco. If you need help quitting, ask your health care provider.  Avoid secondhand smoke.  Avoid exposure to radiation.  Avoid exposure to radon gas. Have your home checked for radon regularly.  Avoid things that cause cancer (carcinogens).  Avoid living or working in places with high air pollution. Questions to ask your health care provider  Am I eligible for lung cancer screening?  Does my health insurance cover the cost of lung cancer screening?  What happens if the lung cancer screening shows something of concern?  How soon will I have results from my lung cancer screening?  Is there anything that I need to do to prepare for my lung cancer screening?  What happens if I decide not to have lung cancer screening? Where to find more information Ask your health care provider about the risks and benefits of screening. More information and resources are available from these organizations:  [place] (ACS): www.cancer.org  American Lung Association: www.lung.org Contact a health care provider if:  You start to show symptoms of lung cancer, including: ? Coughing that will not go away. ? Making whistling sounds when you breathe (wheezing). ? Chest pain. ? Coughing up blood. ? Shortness of breath. ? Weight loss that cannot be explained. ? Constant tiredness (fatigue). ? Hoarse voice. Summary  Lung cancer screening may find lung cancer before symptoms appear. Finding cancer early improves the chances of successful treatment. It may save your life.  The recommended screening test is a low-dose computed tomography (LDCT) scan that looks for abnormal cells in the lungs. If you are at risk for lung cancer, it is recommended that you get screened once a year.  You can make lifestyle changes to lower your risk of lung cancer.  Ask your health care provider about the risks and benefits of  screening. This information is not intended to replace advice given to you by your health care provider. Make sure you discuss any questions you have with your health care provider. Document Revised: 02/02/2020 Document Reviewed: 09/09/2019 Elsevier Patient Education  2021 [employer].

## 2020-11-16 ENCOUNTER — Encounter: Payer: Self-pay | Admitting: Adult Health

## 2020-11-16 ENCOUNTER — Other Ambulatory Visit: Payer: Self-pay | Admitting: Adult Health

## 2020-11-16 DIAGNOSIS — D649 Anemia, unspecified: Secondary | ICD-10-CM | POA: Insufficient documentation

## 2020-11-16 LAB — COMPLETE METABOLIC PANEL WITH GFR
AG Ratio: 1.7 (calc) (ref 1.0–2.5)
ALT: 20 U/L (ref 6–29)
AST: 18 U/L (ref 10–35)
Albumin: 4.1 g/dL (ref 3.6–5.1)
Alkaline phosphatase (APISO): 77 U/L (ref 37–153)
BUN: 19 mg/dL (ref 7–25)
CO2: 30 mmol/L (ref 20–32)
Calcium: 9.2 mg/dL (ref 8.6–10.4)
Chloride: 101 mmol/L (ref 98–110)
Creat: 0.7 mg/dL (ref 0.50–0.99)
GFR, Est African American: 104 mL/min/{1.73_m2} (ref >=60)
GFR, Est Non African American: 90 mL/min/{1.73_m2} (ref >=60)
Globulin: 2.4 g/dL (calc) (ref 1.9–3.7)
Glucose, Bld: 85 mg/dL (ref 65–99)
Potassium: 4.2 mmol/L (ref 3.5–5.3)
Sodium: 140 mmol/L (ref 135–146)
Total Bilirubin: 0.3 mg/dL (ref 0.2–1.2)
Total Protein: 6.5 g/dL (ref 6.1–8.1)

## 2020-11-16 LAB — CBC WITH DIFFERENTIAL/PLATELET
Absolute Monocytes: 505 cells/uL (ref 200–950)
Basophils Absolute: 104 cells/uL (ref 0–200)
Basophils Relative: 1.2 %
Eosinophils Absolute: 209 cells/uL (ref 15–500)
Eosinophils Relative: 2.4 %
HCT: 33.8 % — ABNORMAL LOW (ref 35.0–45.0)
Hemoglobin: 11.4 g/dL — ABNORMAL LOW (ref 11.7–15.5)
Lymphs Abs: 3323 cells/uL (ref 850–3900)
MCH: 31.3 pg (ref 27.0–33.0)
MCHC: 33.7 g/dL (ref 32.0–36.0)
MCV: 92.9 fL (ref 80.0–100.0)
MPV: 11.1 fL (ref 7.5–12.5)
Monocytes Relative: 5.8 %
Neutro Abs: 4559 cells/uL (ref 1500–7800)
Neutrophils Relative %: 52.4 %
Platelets: 323 10*3/uL (ref 140–400)
RBC: 3.64 10*6/uL — ABNORMAL LOW (ref 3.80–5.10)
RDW: 12.4 % (ref 11.0–15.0)
Total Lymphocyte: 38.2 %
WBC: 8.7 10*3/uL (ref 3.8–10.8)

## 2020-11-16 LAB — TEST AUTHORIZATION

## 2020-11-16 LAB — IRON,TIBC AND FERRITIN PANEL
%SAT: 33 % (calc) (ref 16–45)
Ferritin: 51 ng/mL (ref 16–288)
Iron: 108 ug/dL (ref 45–160)
TIBC: 329 mcg/dL (calc) (ref 250–450)

## 2020-11-16 LAB — MAGNESIUM: Magnesium: 2.2 mg/dL (ref 1.5–2.5)

## 2020-11-16 LAB — TSH: TSH: 1.02 mIU/L (ref 0.40–4.50)

## 2020-11-16 LAB — LIPID PANEL
Cholesterol: 131 mg/dL (ref <200)
HDL: 58 mg/dL (ref >=50)
LDL Cholesterol (Calc): 54 mg/dL (calc)
Non-HDL Cholesterol (Calc): 73 mg/dL (calc) (ref <130)
Total CHOL/HDL Ratio: 2.3 (calc) (ref <5.0)
Triglycerides: 111 mg/dL (ref <150)

## 2020-11-16 LAB — RETICULOCYTES
ABS Retic: 47840 cells/uL (ref 20000–8000)
Retic Ct Pct: 1.3 %

## 2020-11-16 LAB — VITAMIN B12: Vitamin B-12: 1512 pg/mL — ABNORMAL HIGH (ref 200–1100)

## 2020-12-13 ENCOUNTER — Other Ambulatory Visit: Payer: Self-pay | Admitting: Internal Medicine

## 2020-12-14 ENCOUNTER — Other Ambulatory Visit: Payer: Self-pay | Admitting: Internal Medicine

## 2020-12-14 ENCOUNTER — Ambulatory Visit (INDEPENDENT_AMBULATORY_CARE_PROVIDER_SITE_OTHER): Payer: Medicare Other

## 2020-12-14 ENCOUNTER — Other Ambulatory Visit: Payer: Self-pay

## 2020-12-14 DIAGNOSIS — E079 Disorder of thyroid, unspecified: Secondary | ICD-10-CM

## 2020-12-14 DIAGNOSIS — D649 Anemia, unspecified: Secondary | ICD-10-CM | POA: Diagnosis not present

## 2020-12-14 MED ORDER — LEVOTHYROXINE SODIUM 75 MCG PO TABS
ORAL_TABLET | ORAL | 1 refills | Status: DC
Start: 2020-12-14 — End: 2021-04-26

## 2020-12-14 MED ORDER — LEVOTHYROXINE SODIUM 75 MCG PO TABS: ORAL_TABLET | ORAL | 1 refills | Status: DC

## 2020-12-14 NOTE — Progress Notes (Signed)
Patient reports for Lab blood work which was entered into epic by provider  Patient had no concerns at intake.

## 2020-12-16 LAB — CBC WITH DIFFERENTIAL/PLATELET
Absolute Monocytes: 567 cells/uL (ref 200–950)
Basophils Absolute: 117 cells/uL (ref 0–200)
Basophils Relative: 1.3 %
Eosinophils Absolute: 207 cells/uL (ref 15–500)
Eosinophils Relative: 2.3 %
HCT: 38.1 % (ref 35.0–45.0)
Hemoglobin: 12.7 g/dL (ref 11.7–15.5)
Lymphs Abs: 4113 cells/uL — ABNORMAL HIGH (ref 850–3900)
MCH: 31 pg (ref 27.0–33.0)
MCHC: 33.3 g/dL (ref 32.0–36.0)
MCV: 92.9 fL (ref 80.0–100.0)
MPV: 10.6 fL (ref 7.5–12.5)
Monocytes Relative: 6.3 %
Neutro Abs: 3996 cells/uL (ref 1500–7800)
Neutrophils Relative %: 44.4 %
Platelets: 330 10*3/uL (ref 140–400)
RBC: 4.1 10*6/uL (ref 3.80–5.10)
RDW: 12.9 % (ref 11.0–15.0)
Total Lymphocyte: 45.7 %
WBC: 9 10*3/uL (ref 3.8–10.8)

## 2020-12-16 LAB — FOLATE RBC: RBC Folate: 707 ng/mL RBC (ref >280)

## 2021-01-10 DIAGNOSIS — F332 Major depressive disorder, recurrent severe without psychotic features: Secondary | ICD-10-CM | POA: Diagnosis not present

## 2021-01-10 DIAGNOSIS — F411 Generalized anxiety disorder: Secondary | ICD-10-CM | POA: Diagnosis not present

## 2021-01-17 ENCOUNTER — Other Ambulatory Visit: Payer: Self-pay | Admitting: Internal Medicine

## 2021-01-17 DIAGNOSIS — I10 Essential (primary) hypertension: Secondary | ICD-10-CM

## 2021-01-17 DIAGNOSIS — R609 Edema, unspecified: Secondary | ICD-10-CM

## 2021-01-17 MED ORDER — BUMETANIDE 2 MG PO TABS
ORAL_TABLET | ORAL | 3 refills | Status: DC
Start: 2021-01-17 — End: 2021-12-26

## 2021-01-24 ENCOUNTER — Telehealth: Payer: Self-pay | Admitting: *Deleted

## 2021-01-24 NOTE — Telephone Encounter (Signed)
Returned call to patient regarding sharp pain in axilla area after lifting about 20 pounds above he head. Patient states she has a pain when she takes a deep breath or raises her arm. Per Dr Melford Aase, try Tylenol 1000 mg 4 times daily, with meals and at bedtime. Patient is aware.

## 2021-02-04 DIAGNOSIS — F332 Major depressive disorder, recurrent severe without psychotic features: Secondary | ICD-10-CM | POA: Diagnosis not present

## 2021-02-04 DIAGNOSIS — F411 Generalized anxiety disorder: Secondary | ICD-10-CM | POA: Diagnosis not present

## 2021-02-14 ENCOUNTER — Other Ambulatory Visit: Payer: Self-pay | Admitting: Adult Health

## 2021-02-14 DIAGNOSIS — K2100 Gastro-esophageal reflux disease with esophagitis, without bleeding: Secondary | ICD-10-CM

## 2021-02-15 ENCOUNTER — Ambulatory Visit: Payer: Medicare Other | Admitting: Internal Medicine

## 2021-02-22 DIAGNOSIS — H2513 Age-related nuclear cataract, bilateral: Secondary | ICD-10-CM | POA: Diagnosis not present

## 2021-03-09 ENCOUNTER — Ambulatory Visit: Payer: Medicare Other | Admitting: Internal Medicine

## 2021-03-16 ENCOUNTER — Ambulatory Visit: Payer: Medicare Other | Admitting: Internal Medicine

## 2021-03-16 NOTE — Progress Notes (Signed)
     C A  N  C  E  L  L  E  D  Morning of App't                                                                                                                                                                                This very nice 70 y.o. DWF presents for 6  month follow up with HTN, HLD, Pre-Diabetes and Vitamin D Deficiency.       Patient is treated for HTN & BP has been controlled at home. Today's  . Patient has had no complaints of any cardiac type chest pain, palpitations, dyspnea / orthopnea / PND, dizziness, claudication, or dependent edema.      Hyperlipidemia is controlled with diet & meds. Patient denies myalgias or other med SE's. Last Lipids were  Lab Results  Component Value Date   CHOL 131 11/15/2020   HDL 58 11/15/2020   LDLCALC 54 11/15/2020   TRIG 111 11/15/2020   CHOLHDL 2.3 11/15/2020   Also, the patient has history of T2_NIDDM PreDiabetes and has had no symptoms of reactive hypoglycemia, diabetic polys, paresthesias or visual blurring.  Last A1c was   Lab Results  Component Value Date   HGBA1C 5.7 (H) 07/28/2020                                                     Further, the patient also has history of Vitamin D Deficiency and supplements vitamin D without any suspected side-effects. Last vitamin D was  Lab Results  Component Value Date   VD25OH 86 07/28/2020

## 2021-03-28 ENCOUNTER — Other Ambulatory Visit: Payer: Self-pay | Admitting: Internal Medicine

## 2021-03-28 MED ORDER — ATORVASTATIN CALCIUM 40 MG PO TABS: ORAL_TABLET | ORAL | 3 refills | Status: DC

## 2021-04-02 DIAGNOSIS — Z20822 Contact with and (suspected) exposure to covid-19: Secondary | ICD-10-CM | POA: Diagnosis not present

## 2021-04-04 ENCOUNTER — Other Ambulatory Visit: Payer: Self-pay

## 2021-04-04 ENCOUNTER — Ambulatory Visit (INDEPENDENT_AMBULATORY_CARE_PROVIDER_SITE_OTHER): Payer: Medicare Other | Admitting: Internal Medicine

## 2021-04-04 ENCOUNTER — Encounter: Payer: Self-pay | Admitting: Internal Medicine

## 2021-04-04 VITALS — BP 124/74 | HR 88 | Temp 97.5°F | Resp 17 | Ht 64.0 in | Wt 145.2 lb

## 2021-04-04 DIAGNOSIS — Z79899 Other long term (current) drug therapy: Secondary | ICD-10-CM | POA: Diagnosis not present

## 2021-04-04 DIAGNOSIS — R7309 Other abnormal glucose: Secondary | ICD-10-CM | POA: Diagnosis not present

## 2021-04-04 DIAGNOSIS — F411 Generalized anxiety disorder: Secondary | ICD-10-CM | POA: Diagnosis not present

## 2021-04-04 DIAGNOSIS — I700 Atherosclerosis of aorta: Secondary | ICD-10-CM | POA: Diagnosis not present

## 2021-04-04 DIAGNOSIS — E559 Vitamin D deficiency, unspecified: Secondary | ICD-10-CM

## 2021-04-04 DIAGNOSIS — E079 Disorder of thyroid, unspecified: Secondary | ICD-10-CM | POA: Diagnosis not present

## 2021-04-04 DIAGNOSIS — I10 Essential (primary) hypertension: Secondary | ICD-10-CM

## 2021-04-04 DIAGNOSIS — F332 Major depressive disorder, recurrent severe without psychotic features: Secondary | ICD-10-CM | POA: Diagnosis not present

## 2021-04-04 DIAGNOSIS — E782 Mixed hyperlipidemia: Secondary | ICD-10-CM

## 2021-04-04 MED ORDER — TOPIRAMATE 200 MG PO TABS: ORAL_TABLET | ORAL | 3 refills | Status: DC

## 2021-04-04 MED ORDER — PHENTERMINE HCL 37.5 MG PO TABS: ORAL_TABLET | ORAL | 1 refills | Status: DC

## 2021-04-04 NOTE — Progress Notes (Signed)
Future Appointments  Date Time Provider Aten  04/04/2021  4:30 PM Unk Pinto, MD [facility] None  08/10/2021  2:00 PM Unk Pinto, MD [facility] None    History of Present Illness:       This very nice 70 y.o. DWF presents for 3 month follow up with HTN, HLD, Pre-Diabetes , Hypothyroid, Depression, COPD and Vitamin D Deficiency. CT scan Oct 2021 showed Aortic Atherosclerosis.       Patient has been on SS Disability since the 1990's for Depression.       Patient is treated for HTN  (2008) & BP has been controlled at home. Today's BP is at goal -  124/74. Patient has had no complaints of any cardiac type chest pain, palpitations, dyspnea / orthopnea / PND, dizziness, claudication, or dependent edema.       Hyperlipidemia is controlled with diet & meds. Patient denies myalgias or other med SE's. Last Lipids were atgoal:  Lab Results  Component Value Date   CHOL 131 11/15/2020   HDL 58 11/15/2020   LDLCALC 54 11/15/2020   TRIG 111 11/15/2020   CHOLHDL 2.3 11/15/2020    Also, the patient has history of PreDiabetes  (A1c 5.7%  /2012) and has had no symptoms of reactive hypoglycemia, diabetic polys, paresthesias or visual blurring.  Last A1c was near goal:  Lab Results  Component Value Date   HGBA1C 5.7 (H) 07/28/2020                                                        Patient was dx'd Hypothyroid in 2010 and was started on Thyroid Replacement.  Further, the patient also has history of Vitamin D Deficiency ("36" /2008) and supplements vitamin D without any suspected side-effects. Last vitamin D was at goal:  Lab Results  Component Value Date   VD25OH 86 07/28/2020     Current Outpatient Medications on File Prior to Visit  Medication Sig   albuterol (PROAIR HFA) 108 (90 Base) MCG/ACT inhaler Use 2 inhalations    15 minutes   every 4 hours      to rescue Asthma   atorvastatin (LIPITOR) 40 MG tablet Take 1 tablet   every other day (even days) for  Cholesterol / Patient knows to take by mouth   bimatoprost (LATISSE) 0.03 % ophthalmic solution 1 drop on applicator, apply evenly along the skin of the upper eyelid at base of eyelashes at bedtime; repeat procedure for second eye   bumetanide (BUMEX) 2 MG tablet Take  1 tablet  2 x /day  for BP & Fluid Retention /Ankle Swelling   buPROPion (WELLBUTRIN XL) 300 MG 24 hr tablet take 1 tablet by mouth once daily (Patient taking differently: Takes 450mg  daily)   Cholecalciferol (VITAMIN D PO) Take 5,000 Units by mouth daily.   famotidine (PEPCID) 40 MG tablet TAKE 1 TABLET BY MOUTH EVERYDAY AT BEDTIME   Flaxseed, Linseed, (FLAX SEED OIL) 1300 MG CAPS Take 1,300 mg by mouth 3 (three) times daily.   levothyroxine (SYNTHROID) 75 MCG tablet Take  1 tablet  Daily  on an empty stomach with only water for 30 minutes & no Antacid meds, Calcium or Magnesium for 4 hours & avoid Biotin   Magnesium 500 MG TABS Take by mouth. Takes 4 tablets daily.   meloxicam (  MOBIC) 15 MG tablet TAKE 1/2 TO 1 TABLET DAILY AS NEEDED WITH FOOD FOR PAIN & INFLAMMATION   Multiple Vitamin (MULTIVITAMIN) tablet Take 1 tablet by mouth daily.   mupirocin ointment (BACTROBAN) 2 % Apply to Skin Infection 2 x /day  (Dx - L08.9)   nystatin cream (MYCOSTATIN) Apply 1 application topically 2 (two) times daily.   Omega-3 Fatty Acids (FISH OIL) 1000 MG CAPS Take 1 capsule (1,000 mg total) by mouth 3 (three) times daily.   pantoprazole (PROTONIX) 40 MG tablet Take    1 tablet    Daily     to Prevent  Indigestion & Acid Reflux   potassium chloride SA (KLOR-CON M20) 20 MEQ tablet Take   1 tablet   3 x /day   for Potassium Deficiency   triamcinolone cream (KENALOG) 0.5 % Apply 1 application topically 2 (two) times daily.   valACYclovir (VALTREX) 500 MG tablet Take 2 tablets 2 x /day at Onset of Symptoms   ALPRAZolam (XANAX) 1 MG tablet TAKE 1/2 TO 1 TABLETS DAILY FOR ANXIETY (Patient not taking: Reported on 04/04/2021)    Allergies  Allergen  Reactions   Codeine Other (See Comments)    Abdominal pain   Erythromycin Base     GI upset   Penicillins Rash    PMHx:   Past Medical History:  Diagnosis Date   Allergy    Anxiety    Aortic atherosclerosis ([place])    Arthritis    Asthma    as a child, out grew    Blood transfusion without reported diagnosis    Cancer ([place])    basal cell left temple, left side of nose   Depression    Diverticulosis    External hemorrhoids    GERD (gastroesophageal reflux disease)    Hiatal hernia    Hyperlipidemia    Hypertension    Internal hemorrhoids    Thyroid disease    hypo   Tubular adenoma of colon     Immunization History  Administered Date(s) Administered   Influenza Split 06/22/2015   Influenza, High Dose Seasonal PF 07/16/2018, 07/02/2019, 07/28/2020   Influenza,inj,Quad PF,6+ Mos 07/22/2017   Influenza,inj,quad, 07/05/2016   Influenza- 07/22/2017   PFIZER  SARS-COV-2 Vaccination 11/01/2019, 11/22/2019   Pneumococcal -13 12/03/2018   Pneumococcal-23 09/25/2004   Td 09/25/2004   Tdap 12/01/2015    Past Surgical History:  Procedure Laterality Date   APPENDECTOMY     bil breast lumpectomy     bilateral breast masectomy  no breast cancer   bil breast removed to prevent ca.   BREAST RECONSTRUCTION  2008   BREAST SURGERY Bilateral 1983   implants   CHOLECYSTECTOMY  1975   EUS N/A 04/23/2014   Procedure: LOWER ENDOSCOPIC ULTRASOUND (EUS);  Surgeon: Milus Banister, MD;  Location: [facility];  Service: Endoscopy;  Laterality: N/A;   LIPOSUCTION  1998   with tummy tuck   NASAL SINUS SURGERY  1996    FHx:    Reviewed / unchanged  SHx:    Reviewed / unchanged   Systems Review:  Constitutional: Denies fever, chills, wt changes, headaches, insomnia, fatigue, night sweats, change in appetite. Eyes: Denies redness, blurred vision, diplopia, discharge, itchy, watery eyes.  ENT: Denies discharge, congestion, post nasal drip, epistaxis, sore throat, earache, hearing  loss, dental pain, tinnitus, vertigo, sinus pain, snoring.  CV: Denies chest pain, palpitations, irregular heartbeat, syncope, dyspnea, diaphoresis, orthopnea, PND, claudication or edema. Respiratory: denies cough, dyspnea, DOE, pleurisy, hoarseness, laryngitis,  wheezing.  Gastrointestinal: Denies dysphagia, odynophagia, heartburn, reflux, water brash, abdominal pain or cramps, nausea, vomiting, bloating, diarrhea, constipation, hematemesis, melena, hematochezia  or hemorrhoids. Genitourinary: Denies dysuria, frequency, urgency, nocturia, hesitancy, discharge, hematuria or flank pain. Musculoskeletal: Denies arthralgias, myalgias, stiffness, jt. swelling, pain, limping or strain/sprain.  Skin: Denies pruritus, rash, hives, warts, acne, eczema or change in skin lesion(s). Neuro: No weakness, tremor, incoordination, spasms, paresthesia or pain. Psychiatric: Denies confusion, memory loss or sensory loss. Endo: Denies change in weight, skin or hair change.  Heme/Lymph: No excessive bleeding, bruising or enlarged lymph nodes.  Physical Exam  BP 124/74   Pulse 88   Temp (!) 97.5 F (36.4 C)   Resp 17   Ht 5\' 4"  (1.626 m)   Wt 145 lb 3.2 oz (65.9 kg)   SpO2 98%   BMI 24.92 kg/m   Appears  well nourished, well groomed  and in no distress.  Eyes: PERRLA, EOMs, conjunctiva no swelling or erythema. Sinuses: No frontal/maxillary tenderness ENT/Mouth: EAC's clear, TM's nl w/o erythema, bulging. Nares clear w/o erythema, swelling, exudates. Oropharynx clear without erythema or exudates. Oral hygiene is good. Tongue normal, non obstructing. Hearing intact.  Neck: Supple. Thyroid not palpable. Car 2+/2+ without bruits, nodes or JVD. Chest: Respirations nl with BS clear & equal w/o rales, rhonchi, wheezing or stridor.  Cor: Heart sounds normal w/ regular rate and rhythm without sig. murmurs, gallops, clicks or rubs. Peripheral pulses normal and equal  without edema.  Abdomen: Soft & bowel sounds  normal. Non-tender w/o guarding, rebound, hernias, masses or organomegaly.  Lymphatics: Unremarkable.  Musculoskeletal: Full ROM all peripheral extremities, joint stability, 5/5 strength and normal gait.  Skin: Warm, dry without exposed rashes, lesions or ecchymosis apparent.  Neuro: Cranial nerves intact, reflexes equal bilaterally. Sensory-motor testing grossly intact. Tendon reflexes grossly intact.  Pysch: Alert & oriented x 3.  Insight and judgement nl & appropriate. No ideations.  Assessment and Plan:  1. Essential hypertension  - Continue medication, monitor blood pressure at home.  - Continue DASH diet.  Reminder to go to the ER if any CP,  SOB, nausea, dizziness, severe HA, changes vision/speech.   - CBC with Differential/Platelet - COMPLETE METABOLIC PANEL WITH GFR - Magnesium - TSH  2. Hyperlipidemia, mixed  - Continue diet/meds, exercise,& lifestyle modifications.  - Continue monitor periodic cholesterol/liver & renal functions    - Lipid panel - TSH  3. Abnormal glucose  - Continue diet, exercise  - Lifestyle modifications.  - Monitor appropriate labs   - Hemoglobin A1c - Insulin, random  4. Vitamin D deficiency  - Continue supplementation   - VITAMIN D 25 Hydroxy   5. Hypothyroidism  - TSH  6. Aortic atherosclerosis ([place]) by Abd CT scan 10.11.2021  - Lipid panel  7. Medication management  - CBC with Differential/Platelet - COMPLETE METABOLIC PANEL WITH GFR - Magnesium - Lipid panel - TSH - Hemoglobin A1c - Insulin, random - VITAMIN D 25 Hydroxy         Discussed  regular exercise, BP monitoring, weight control to achieve/maintain BMI less than 25 and discussed med and SE's. Recommended labs to assess and monitor clinical status with further disposition pending results of labs.  I discussed the assessment and treatment plan with the patient. The patient was provided an opportunity to ask questions and all were answered. The patient agreed  with the plan and demonstrated an understanding of the instructions.  I provided over 30 minutes of exam, counseling, chart review  and  complex critical decision making.       The patient was advised to call back or seek an in-person evaluation if the symptoms worsen or if the condition fails to improve as anticipated.   Kirtland Bouchard, MD

## 2021-04-04 NOTE — Patient Instructions (Signed)

## 2021-04-05 LAB — CBC WITH DIFFERENTIAL/PLATELET
Absolute Monocytes: 516 cells/uL (ref 200–950)
Basophils Absolute: 92 cells/uL (ref 0–200)
Basophils Relative: 1.2 %
Eosinophils Absolute: 262 cells/uL (ref 15–500)
Eosinophils Relative: 3.4 %
HCT: 39.4 % (ref 35.0–45.0)
Hemoglobin: 12.9 g/dL (ref 11.7–15.5)
Lymphs Abs: 3373 cells/uL (ref 850–3900)
MCH: 30.7 pg (ref 27.0–33.0)
MCHC: 32.7 g/dL (ref 32.0–36.0)
MCV: 93.8 fL (ref 80.0–100.0)
MPV: 10.7 fL (ref 7.5–12.5)
Monocytes Relative: 6.7 %
Neutro Abs: 3457 cells/uL (ref 1500–7800)
Neutrophils Relative %: 44.9 %
Platelets: 355 10*3/uL (ref 140–400)
RBC: 4.2 10*6/uL (ref 3.80–5.10)
RDW: 12.9 % (ref 11.0–15.0)
Total Lymphocyte: 43.8 %
WBC: 7.7 10*3/uL (ref 3.8–10.8)

## 2021-04-05 LAB — COMPLETE METABOLIC PANEL WITH GFR
AG Ratio: 1.6 (calc) (ref 1.0–2.5)
ALT: 13 U/L (ref 6–29)
AST: 13 U/L (ref 10–35)
Albumin: 4.2 g/dL (ref 3.6–5.1)
Alkaline phosphatase (APISO): 98 U/L (ref 37–153)
BUN: 23 mg/dL (ref 7–25)
CO2: 27 mmol/L (ref 20–32)
Calcium: 9.1 mg/dL (ref 8.6–10.4)
Chloride: 103 mmol/L (ref 98–110)
Creat: 0.79 mg/dL (ref 0.50–1.05)
Globulin: 2.6 g/dL (calc) (ref 1.9–3.7)
Glucose, Bld: 85 mg/dL (ref 65–99)
Potassium: 4 mmol/L (ref 3.5–5.3)
Sodium: 139 mmol/L (ref 135–146)
Total Bilirubin: 0.3 mg/dL (ref 0.2–1.2)
Total Protein: 6.8 g/dL (ref 6.1–8.1)
eGFR: 81 mL/min/{1.73_m2} (ref >=60)

## 2021-04-05 LAB — LIPID PANEL
Cholesterol: 146 mg/dL (ref <200)
HDL: 55 mg/dL (ref >=50)
LDL Cholesterol (Calc): 67 mg/dL (calc)
Non-HDL Cholesterol (Calc): 91 mg/dL (calc) (ref <130)
Total CHOL/HDL Ratio: 2.7 (calc) (ref <5.0)
Triglycerides: 153 mg/dL — ABNORMAL HIGH (ref <150)

## 2021-04-05 LAB — TSH: TSH: 0.25 mIU/L — ABNORMAL LOW (ref 0.40–4.50)

## 2021-04-05 LAB — VITAMIN D 25 HYDROXY (VIT D DEFICIENCY, FRACTURES): Vit D, 25-Hydroxy: 58 ng/mL (ref 30–100)

## 2021-04-05 LAB — HEMOGLOBIN A1C
Hgb A1c MFr Bld: 5.3 % of total Hgb (ref <5.7)
Mean Plasma Glucose: 105 mg/dL
eAG (mmol/L): 5.8 mmol/L

## 2021-04-05 LAB — INSULIN, RANDOM: Insulin: 8.6 u[IU]/mL

## 2021-04-05 LAB — MAGNESIUM: Magnesium: 1.9 mg/dL (ref 1.5–2.5)

## 2021-04-06 NOTE — Progress Notes (Signed)
============================================================ ============================================================  -    Total Chol = 146   and LDL Chol = 67 -- Both   Excellent ============================================================ ============================================================  -  TSH is a little low - Please be sure NOT taking any supplements with   Biotin in  it, because Biotin causes  incorrect thyroid measurements ! ============================================================ ============================================================  -  A1c = 5.3% - Great  - Back in Normal nonDiabetic range. ============================================================ ============================================================  -  Vitamin D = 58 - slightly low  - Vitamin D goal is between 70-100.   - Please make sure that you are taking your Vitamin D as directed.  ============================================================ ============================================================  -  All Else - CBC - Kidneys - Electrolytes - Liver - Magnesium & Thyroid    - all  Normal / OK ============================================================ ============================================================

## 2021-04-07 ENCOUNTER — Other Ambulatory Visit: Payer: Self-pay

## 2021-04-07 MED ORDER — TOPIRAMATE 200 MG PO TABS: ORAL_TABLET | ORAL | 3 refills | Status: DC

## 2021-04-11 ENCOUNTER — Other Ambulatory Visit: Payer: Self-pay

## 2021-04-11 MED ORDER — TOPIRAMATE 200 MG PO TABS: ORAL_TABLET | ORAL | 3 refills | Status: DC

## 2021-04-25 ENCOUNTER — Telehealth: Payer: Self-pay

## 2021-04-25 NOTE — Telephone Encounter (Signed)
Prior Auth approved for Topiramate '200mg'$  until 04/25/22.

## 2021-04-26 ENCOUNTER — Other Ambulatory Visit: Payer: Self-pay | Admitting: Internal Medicine

## 2021-04-26 ENCOUNTER — Other Ambulatory Visit: Payer: Self-pay

## 2021-04-26 DIAGNOSIS — K2100 Gastro-esophageal reflux disease with esophagitis, without bleeding: Secondary | ICD-10-CM

## 2021-04-26 DIAGNOSIS — E079 Disorder of thyroid, unspecified: Secondary | ICD-10-CM

## 2021-04-26 MED ORDER — LEVOTHYROXINE SODIUM 75 MCG PO TABS: ORAL_TABLET | ORAL | 1 refills | Status: DC

## 2021-04-26 MED ORDER — TOPIRAMATE 200 MG PO TABS: ORAL_TABLET | ORAL | 3 refills | Status: DC

## 2021-05-03 ENCOUNTER — Other Ambulatory Visit: Payer: Self-pay | Admitting: Internal Medicine

## 2021-05-03 DIAGNOSIS — K2100 Gastro-esophageal reflux disease with esophagitis, without bleeding: Secondary | ICD-10-CM

## 2021-05-16 ENCOUNTER — Other Ambulatory Visit: Payer: Self-pay | Admitting: Internal Medicine

## 2021-05-16 ENCOUNTER — Telehealth: Payer: Self-pay

## 2021-05-16 MED ORDER — AMPHETAMINE-DEXTROAMPHETAMINE 20 MG PO TABS: ORAL_TABLET | ORAL | 0 refills | Status: DC

## 2021-05-16 MED ORDER — VILAZODONE HCL 40 MG PO TABS: ORAL_TABLET | ORAL | Status: AC

## 2021-05-16 NOTE — Telephone Encounter (Signed)
Patient inquired about using generic Rogaine Foam because it has biotin in the ingredients. Per Dr. Melford Aase it is okay to use the foam.

## 2021-07-12 DIAGNOSIS — Z23 Encounter for immunization: Secondary | ICD-10-CM | POA: Diagnosis not present

## 2021-07-12 IMAGING — CT CT ABD-PELV W/ CM
2 of 5 series · 12 of 46 positions shown, 14 images · IV contrast (iopamidol)
Comparison: None.

CLINICAL DATA: Left upper quadrant abdominal pain. Abdominal
distension. Lower extremity swelling.

EXAM:
CT ABDOMEN AND PELVIS WITH CONTRAST
TECHNIQUE: Multidetector CT imaging of the abdomen and pelvis was performed
using the standard protocol following bolus administration of
intravenous contrast.
CONTRAST:  100 mL V1K2S5-S55 IOPAMIDOL (V1K2S5-S55) INJECTION 61%

[Series 2: abd pelvis 5.00 br40 s3 axial · axial · 0.52mm/px · z∈[+1314,+1669]mm · 9 of 81 slices shown, 11 images]
[im 5/81  soft-tissue]
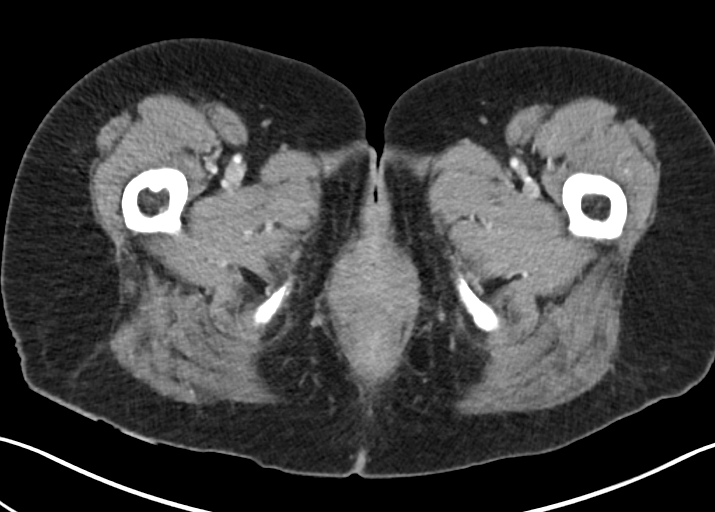
[im 5/81  bone]
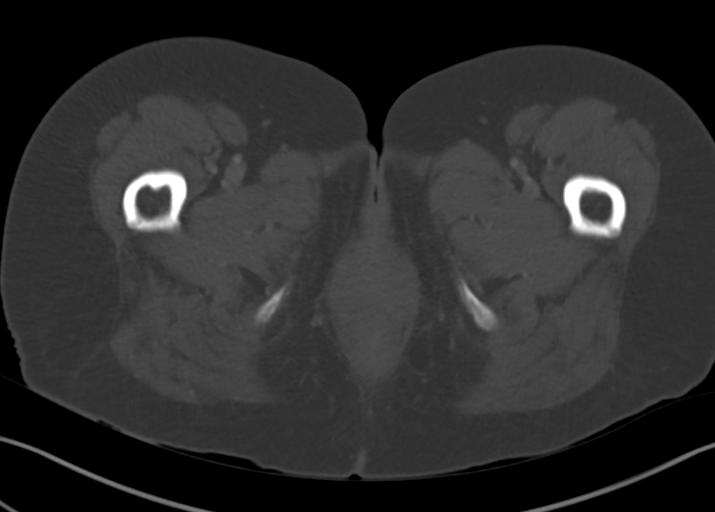
[im 14/81  soft-tissue]
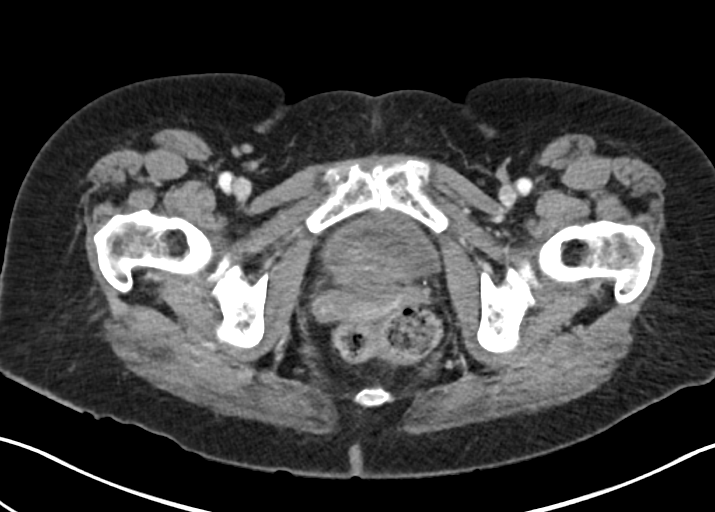
[im 23/81  soft-tissue]
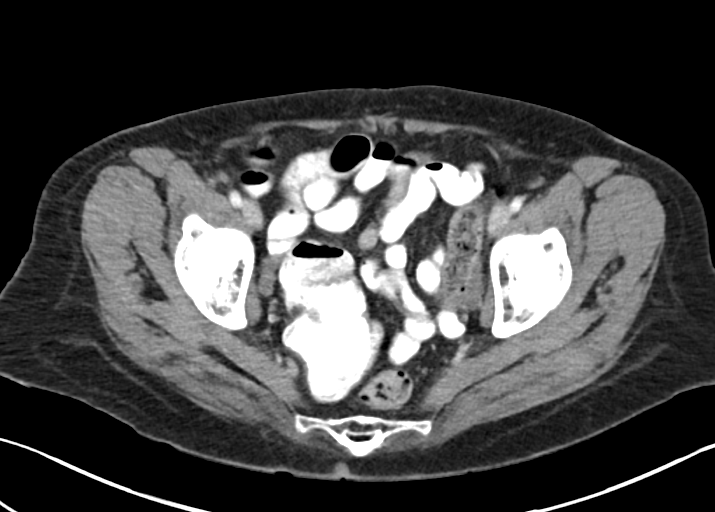
[im 32/81  soft-tissue]
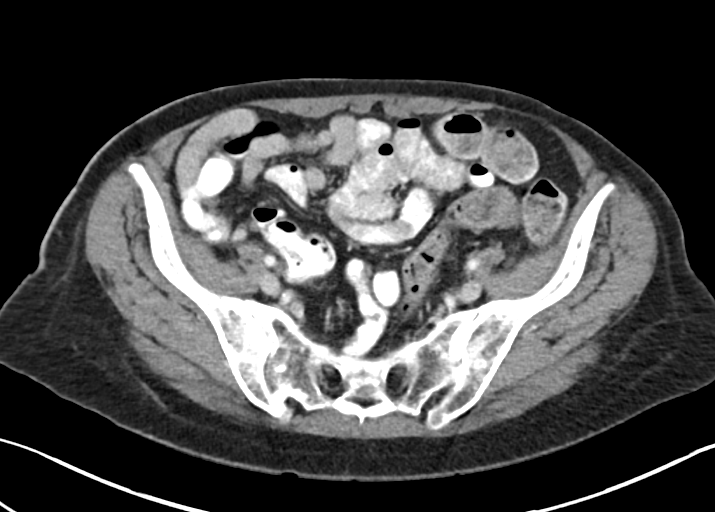
[im 41/81  soft-tissue]
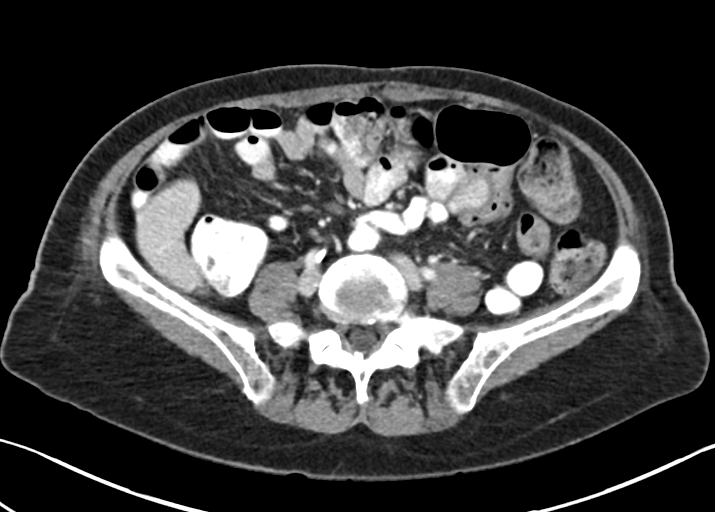
[im 49/81  soft-tissue]
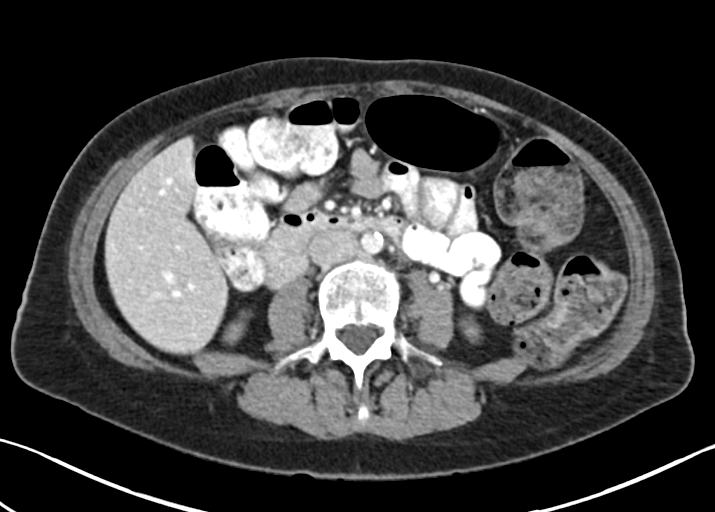
[im 58/81  soft-tissue]
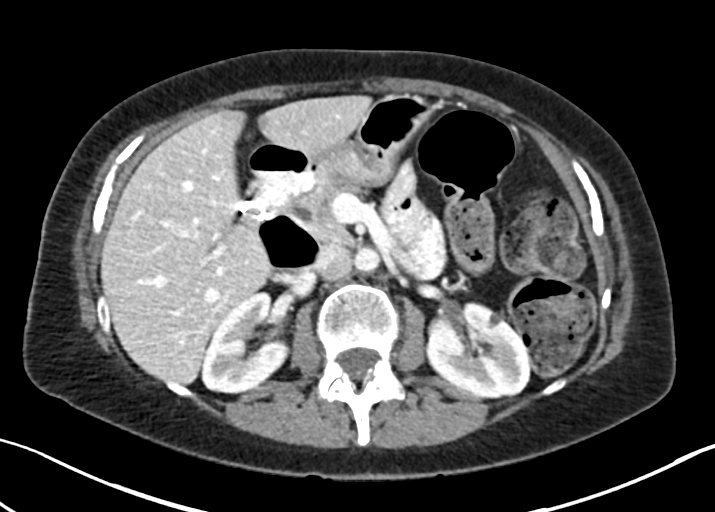
[im 67/81  soft-tissue]
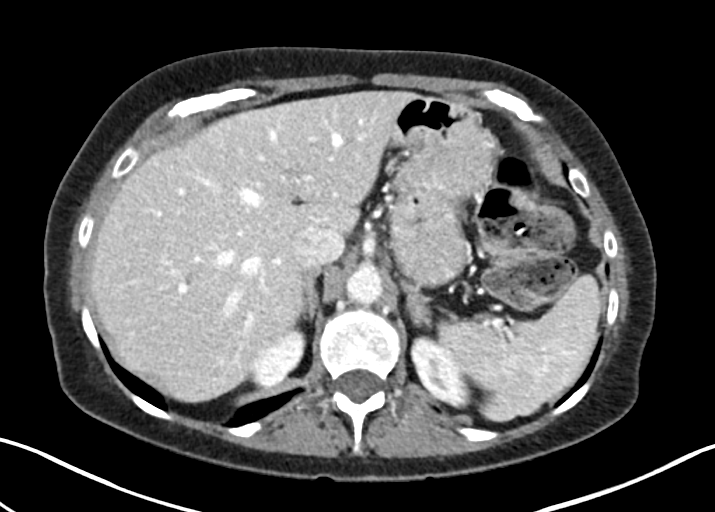
[im 76/81  soft-tissue]
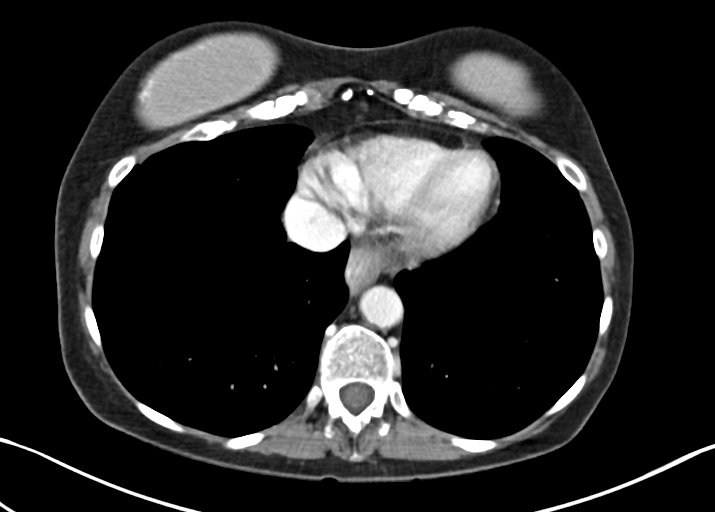
[im 76/81  bone]
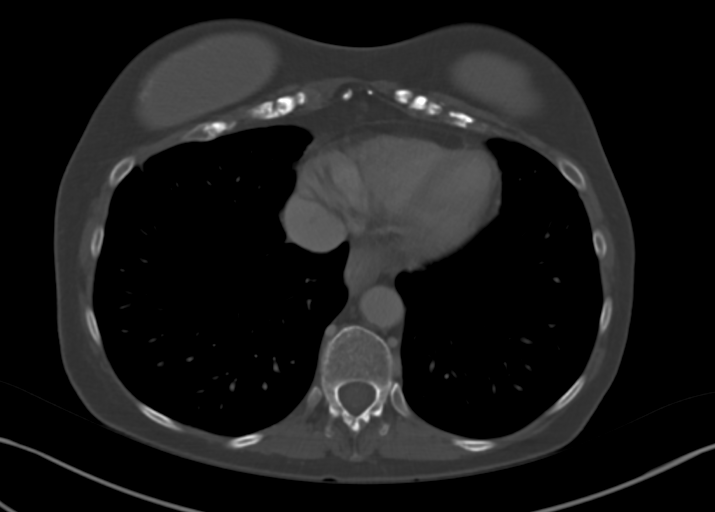

[Series 6: abd pelvis 2.00 br40 s3 cor · coronal · 0.73mm/px · 3 of 133 slices shown]
[im 45/133  soft-tissue]
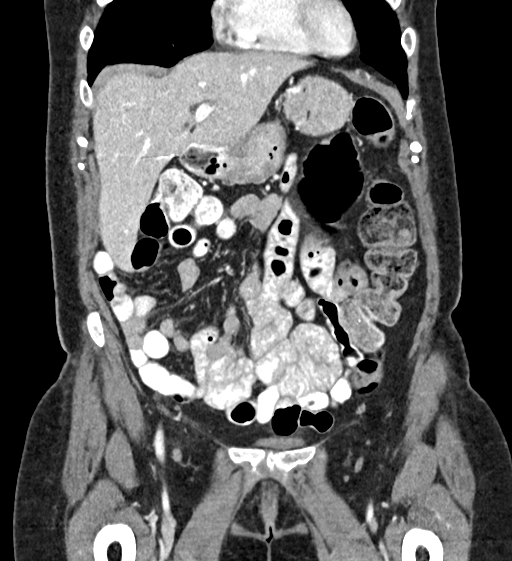
[im 59/133  soft-tissue]
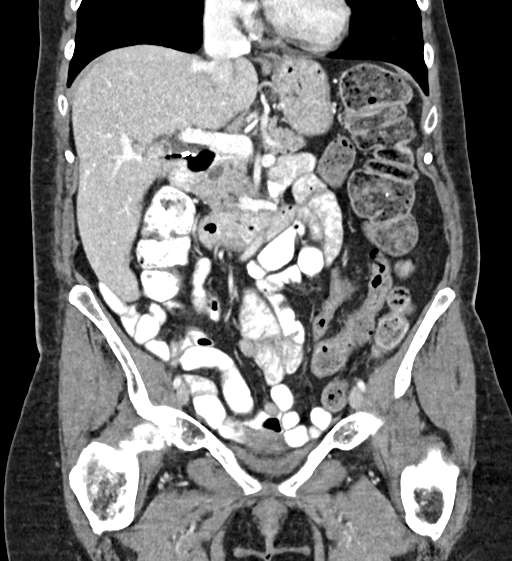
[im 74/133  soft-tissue]
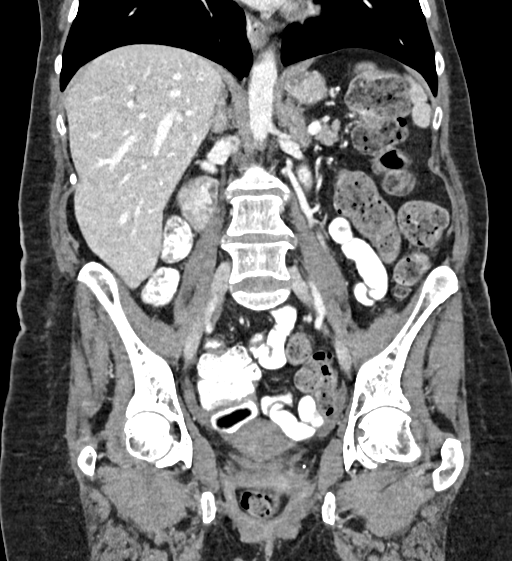

[12 of 46 positions shown; findings below may reference images not displayed]

FINDINGS: Lower chest: Lung bases are clear. Emphysematous disease is noted.
Heart size is normal. No pleural or pericardial effusion.

Hepatobiliary: No focal liver abnormality is seen. Status post
cholecystectomy. No biliary dilatation.

Pancreas: Unremarkable. No pancreatic ductal dilatation or
surrounding inflammatory changes.

Spleen: Normal in size without focal abnormality.

Adrenals/Urinary Tract: Adrenal glands are unremarkable. Kidneys are
normal, without renal calculi, worrisome focal lesion, or
hydronephrosis. Very small left renal cyst incidentally noted.
Bladder is unremarkable.

Stomach/Bowel: Stomach is within normal limits with a small hiatal
hernia seen. Status post appendectomy. No evidence of bowel wall
thickening, distention, or inflammatory changes.

Vascular/Lymphatic: Aortic atherosclerosis. No enlarged abdominal or
pelvic lymph nodes.

Reproductive: Uterus and bilateral adnexa are unremarkable.

Other: None.

Musculoskeletal: No acute or focal abnormality.
IMPRESSION: No acute abnormality or finding to explain the patient's symptoms.

Small hiatal hernia.

Aortic Atherosclerosis ([facility]-IS4.4) and Emphysema ([facility]-0AL.5).

## 2021-07-17 ENCOUNTER — Other Ambulatory Visit: Payer: Self-pay | Admitting: Internal Medicine

## 2021-07-17 MED ORDER — BIMATOPROST 0.03 % EX SOLN: CUTANEOUS | 3 refills | Status: DC

## 2021-07-21 ENCOUNTER — Other Ambulatory Visit: Payer: Self-pay

## 2021-07-21 DIAGNOSIS — Z20822 Contact with and (suspected) exposure to covid-19: Secondary | ICD-10-CM | POA: Diagnosis not present

## 2021-07-21 MED ORDER — BIMATOPROST 0.03 % EX SOLN: CUTANEOUS | 3 refills | Status: DC

## 2021-07-25 ENCOUNTER — Other Ambulatory Visit: Payer: Self-pay | Admitting: Adult Health

## 2021-07-25 ENCOUNTER — Other Ambulatory Visit: Payer: Self-pay | Admitting: Internal Medicine

## 2021-07-25 DIAGNOSIS — K2100 Gastro-esophageal reflux disease with esophagitis, without bleeding: Secondary | ICD-10-CM

## 2021-07-25 MED ORDER — PANTOPRAZOLE SODIUM 40 MG PO TBEC: DELAYED_RELEASE_TABLET | ORAL | 1 refills | Status: DC

## 2021-08-10 ENCOUNTER — Encounter: Payer: Medicare Other | Admitting: Internal Medicine

## 2021-08-15 ENCOUNTER — Encounter: Payer: Self-pay | Admitting: Nurse Practitioner

## 2021-08-15 ENCOUNTER — Ambulatory Visit: Payer: Medicare Other | Admitting: Nurse Practitioner

## 2021-08-15 ENCOUNTER — Other Ambulatory Visit: Payer: Self-pay | Admitting: Nurse Practitioner

## 2021-08-15 VITALS — Temp 98.6°F

## 2021-08-15 DIAGNOSIS — R051 Acute cough: Secondary | ICD-10-CM | POA: Diagnosis not present

## 2021-08-15 DIAGNOSIS — J101 Influenza due to other identified influenza virus with other respiratory manifestations: Secondary | ICD-10-CM

## 2021-08-15 DIAGNOSIS — J4521 Mild intermittent asthma with (acute) exacerbation: Secondary | ICD-10-CM | POA: Diagnosis not present

## 2021-08-15 MED ORDER — ALBUTEROL SULFATE HFA 108 (90 BASE) MCG/ACT IN AERS: INHALATION_SPRAY | RESPIRATORY_TRACT | 2 refills | Status: DC

## 2021-08-15 MED ORDER — PROMETHAZINE-DM 6.25-15 MG/5ML PO SYRP: 5.0000 mL | ORAL_SOLUTION | Freq: Four times a day (QID) | ORAL | 1 refills | Status: DC | PRN

## 2021-08-15 MED ORDER — DEXAMETHASONE 1 MG PO TABS: ORAL_TABLET | ORAL | 0 refills | Status: DC

## 2021-08-15 MED ORDER — OSELTAMIVIR PHOSPHATE 75 MG PO CAPS: 75.0000 mg | ORAL_CAPSULE | Freq: Two times a day (BID) | ORAL | 0 refills | Status: DC

## 2021-08-15 NOTE — Patient Instructions (Signed)
Influenza, Adult °Influenza, also called "the flu," is a viral infection that mainly affects the respiratory tract. This includes the lungs, nose, and throat. The flu spreads easily from person to person (is contagious). It causes common cold symptoms, along with high fever and body aches. °What are the causes? °This condition is caused by the influenza virus. You can get the virus by: °Breathing in droplets that are in the air from an infected person's cough or sneeze. °Touching something that has the virus on it (has been contaminated) and then touching your mouth, nose, or eyes. °What increases the risk? °The following factors may make you more likely to get the flu: °Not washing or sanitizing your hands often. °Having close contact with many people during cold and flu season. °Touching your mouth, eyes, or nose without first washing or sanitizing your hands. °Not getting an annual flu shot. °You may have a higher risk for the flu, including serious problems, such as a lung infection (pneumonia), if you: °Are older than 65. °Are pregnant. °Have a weakened disease-fighting system (immune system). This includes people who have HIV or AIDS, are on chemotherapy, or are taking medicines that reduce (suppress) the immune system. °Have a long-term (chronic) illness, such as heart disease, kidney disease, diabetes, or lung disease. °Have a liver disorder. °Are severely overweight (morbidly obese). °Have anemia. °Have asthma. °What are the signs or symptoms? °Symptoms of this condition usually begin suddenly and last 4-14 days. These may include: °Fever and chills. °Headaches, body aches, or muscle aches. °Sore throat. °Cough. °Runny or stuffy (congested) nose. °Chest discomfort. °Poor appetite. °Weakness or fatigue. °Dizziness. °Nausea or vomiting. °How is this diagnosed? °This condition may be diagnosed based on: °Your symptoms and medical history. °A physical exam. °Swabbing your nose or throat and testing the fluid  for the influenza virus. °How is this treated? °If the flu is diagnosed early, you can be treated with antiviral medicine that is given by mouth (orally) or through an IV. This can help reduce how severe the illness is and how long it lasts. °Taking care of yourself at home can help relieve symptoms. Your health care provider may recommend: °Taking over-the-counter medicines. °Drinking plenty of fluids. °In many cases, the flu goes away on its own. If you have severe symptoms or complications, you may be treated in a hospital. °Follow these instructions at home: °Activity °Rest as needed and get plenty of sleep. °Stay home from work or school as told by your health care provider. Unless you are visiting your health care provider, avoid leaving home until your fever has been gone for 24 hours without taking medicine. °Eating and drinking °Take an oral rehydration solution (ORS). This is a drink that is sold at pharmacies and retail stores. °Drink enough fluid to keep your urine pale yellow. °Drink clear fluids in small amounts as you are able. Clear fluids include water, ice chips, fruit juice mixed with water, and low-calorie sports drinks. °Eat bland, easy-to-digest foods in small amounts as you are able. These foods include bananas, applesauce, rice, lean meats, toast, and crackers. °Avoid drinking fluids that contain a lot of sugar or caffeine, such as energy drinks, regular sports drinks, and soda. °Avoid alcohol. °Avoid spicy or fatty foods. °General instructions °  °Take over-the-counter and prescription medicines only as told by your health care provider. °Use a cool mist humidifier to add humidity to the air in your home. This can make it easier to breathe. °When using a cool mist humidifier,   clean it daily. Empty the water and replace it with clean water. °Cover your mouth and nose when you cough or sneeze. °Wash your hands with soap and water often and for at least 20 seconds, especially after you cough or  sneeze. If soap and water are not available, use alcohol-based hand sanitizer. °Keep all follow-up visits. This is important. °How is this prevented? ° °Get an annual flu shot. This is usually available in late summer, fall, or winter. Ask your health care provider when you should get your flu shot. °Avoid contact with people who are sick during cold and flu season. This is generally fall and winter. °Contact a health care provider if: °You develop new symptoms. °You have: °Chest pain. °Diarrhea. °A fever. °Your cough gets worse. °You produce more mucus. °You feel nauseous or you vomit. °Get help right away if you: °Develop shortness of breath or have difficulty breathing. °Have skin or nails that turn a bluish color. °Have severe pain or stiffness in your neck. °Develop a sudden headache or sudden pain in your face or ear. °Cannot eat or drink without vomiting. °These symptoms may represent a serious problem that is an emergency. Do not wait to see if the symptoms will go away. Get medical help right away. Call your local emergency services (911 in the [place] Do not drive yourself to the hospital. °Summary °Influenza, also called "the flu," is a viral infection that primarily affects your respiratory tract. °Symptoms of the flu usually begin suddenly and last 4-14 days. °Getting an annual flu shot is the best way to prevent getting the flu. °Stay home from work or school as told by your health care provider. Unless you are visiting your health care provider, avoid leaving home until your fever has been gone for 24 hours without taking medicine. °Keep all follow-up visits. This is important. °This information is not intended to replace advice given to you by your health care provider. Make sure you discuss any questions you have with your health care provider. °Document Revised: 04/30/2020 Document Reviewed: 04/30/2020 °Elsevier Patient Education © 2022 [employer] ° °

## 2021-08-15 NOTE — Progress Notes (Signed)
THIS ENCOUNTER IS A VIRTUAL VISIT DUE TO FLU A - PATIENT WAS NOT SEEN IN THE OFFICE.  PATIENT HAS CONSENTED TO VIRTUAL VISIT / TELEMEDICINE VISIT   Virtual Visit via telephone Note  I connected with  Margaret Nelson on 08/15/2021 by telephone.  I verified that I am speaking with the correct person using two identifiers.    I discussed the limitations of evaluation and management by telemedicine and the availability of in person appointments. The patient expressed understanding and agreed to proceed.  History of Present Illness:  Temp 98.6 F (37 C)   SpO2 98%  70 y.o. patient contacted office reporting URI sx Productive Cough and congestion. she tested positive for FLU A by test at boyfriends rehab. OV was conducted by telephone to minimize exposure. This patient was vaccinated for Influenza 3-4 weeks ago.  Sx began 3 days ago with sinus pressure, cough, congestion, sore throat, muscle aches, nausea , and fever. Coughing has gotten progressively worse, is occasionally coughing up yellow mucus.  Treatments tried so far: Nyquil cold and flu not helping symptoms  Exposures: Boyfriend is positive for Flu, he is doing inpatient rehab currently, they checked her at the rehab and was positive    Medications  Current Outpatient Medications (Endocrine & Metabolic):    dexamethasone (DECADRON) 1 MG tablet, Take 3 tabs for 3 days, 2 tabs for 3 days 1 tab for 5 days. Take with food.   levothyroxine (SYNTHROID) 75 MCG tablet, Take  1 tablet  Daily  on an empty stomach with only water for 30 minutes & no Antacid meds, Calcium or Magnesium for 4 hours & avoid Biotin  Current Outpatient Medications (Cardiovascular):    atorvastatin (LIPITOR) 40 MG tablet, Take 1 tablet   every other day (even days) for Cholesterol / Patient knows to take by mouth   bumetanide (BUMEX) 2 MG tablet, Take  1 tablet  2 x /day  for BP & Fluid Retention /Ankle Swelling  Current Outpatient Medications (Respiratory):     albuterol (PROAIR HFA) 108 (90 Base) MCG/ACT inhaler, Use 2 inhalations    15 minutes   every 4 hours      to rescue Asthma   promethazine-dextromethorphan (PROMETHAZINE-DM) 6.25-15 MG/5ML syrup, Take 5 mLs by mouth 4 (four) times daily as needed for cough.  Current Outpatient Medications (Analgesics):    acetaminophen (TYLENOL) 500 MG tablet, Take 500 mg by mouth every 6 (six) hours as needed.   meloxicam (MOBIC) 15 MG tablet, TAKE 1/2 TO 1 TABLET DAILY AS NEEDED WITH FOOD FOR PAIN & INFLAMMATION   Current Outpatient Medications (Other):    amphetamine-dextroamphetamine (ADDERALL) 20 MG tablet, Takes 1 tab 2 x /day per Dr Toy Care   bimatoprost (LATISSE) 0.03 % ophthalmic solution, 1 drop on applicator, apply evenly along the skin of the upper eyelid at base of eyelashes at bedtime; repeat procedure for second eye   Cholecalciferol (VITAMIN D PO), Take 5,000 Units by mouth daily. 10,000   famotidine (PEPCID) 40 MG tablet, TAKE 1 TABLET BY MOUTH EVERYDAY AT BEDTIME   Flaxseed, Linseed, (FLAX SEED OIL) 1300 MG CAPS, Take 1,300 mg by mouth 3 (three) times daily.   Magnesium 500 MG TABS, Take by mouth. Takes 4 tablets daily.   Multiple Vitamin (MULTIVITAMIN) tablet, Take 1 tablet by mouth daily.   mupirocin ointment (BACTROBAN) 2 %, Apply to Skin Infection 2 x /day  (Dx - L08.9)   nystatin cream (MYCOSTATIN), Apply 1 application topically 2 (two) times daily.  Omega-3 Fatty Acids (FISH OIL) 1000 MG CAPS, Take 1 capsule (1,000 mg total) by mouth 3 (three) times daily.   oseltamivir (TAMIFLU) 75 MG capsule, Take 1 capsule (75 mg total) by mouth 2 (two) times daily for 10 days.   pantoprazole (PROTONIX) 40 MG tablet, Take    1 tablet    Daily     to Prevent  Indigestion & Acid Reflux   potassium chloride SA (KLOR-CON M20) 20 MEQ tablet, Take   1 tablet   3 x /day   for Potassium Deficiency   triamcinolone cream (KENALOG) 0.5 %, Apply 1 application topically 2 (two) times daily.   valACYclovir (VALTREX)  500 MG tablet, Take 2 tablets 2 x /day at Onset of Symptoms   Vilazodone HCl (VIIBRYD) 40 MG TABS, Takes 1 tablet Daily per Dr Toy Care   buPROPion (WELLBUTRIN XL) 300 MG 24 hr tablet, take 1 tablet by mouth once daily (Patient not taking: Reported on 08/15/2021)   phentermine (ADIPEX-P) 37.5 MG tablet, Take  1 tablet  every Morning  for Dieting & Weight Loss (Patient not taking: Reported on 08/15/2021)   topiramate (TOPAMAX) 200 MG tablet, Take  1 tablet  at Bedtime  for Headache Prevention & Dieting & Weight Loss (Patient not taking: Reported on 08/15/2021)  Allergies:  Allergies  Allergen Reactions   Codeine Other (See Comments)    Abdominal pain   Erythromycin Base     GI upset   Penicillins Rash    Problem list She has Essential hypertension; Hyperlipidemia, mixed; Vitamin D deficiency; Hypothyroidism; Medication management; Gastroesophageal reflux disease; Smoker; Extrinsic asthma; Body mass index (BMI) of 23.0-23.9 in adult; Severe recurrent major depression without psychotic features ([place]); Abnormal glucose; Attention deficit hyperactivity disorder (ADHD); COPD (chronic obstructive pulmonary disease) ([place]); Aortic atherosclerosis ([place]) by Abd CT scan 10.11.2021; Hair thinning; and Anemia on their problem list.   Social History:   reports that she has been smoking e-cigarettes and cigarettes. She started smoking about 52 years ago. She has a 39.00 pack-year smoking history. She has never used smokeless tobacco. She reports current alcohol use of about 4.0 - 5.0 standard drinks per week. She reports that she does not use drugs.  Observations/Objective:  General : Well sounding patient in no apparent distress HEENT: Hoarseness and dry cough audible Lungs: speaks in complete sentences, no audible wheezing, no apparent distress Neurological: alert, oriented x 3 Psychiatric: pleasant, judgement appropriate   Assessment and Plan:  Diagnoses and all orders for this visit:  Acute  cough -     promethazine-dextromethorphan (PROMETHAZINE-DM) 6.25-15 MG/5ML syrup; Take 5 mLs by mouth 4 (four) times daily as needed for cough. Chest xray ordered at [facility] to rule out pneumonia  Mild intermittent asthmatic bronchitis with acute exacerbation -     dexamethasone (DECADRON) 1 MG tablet; Take 3 tabs for 3 days, 2 tabs for 3 days 1 tab for 5 days. Take with food. -     albuterol (PROAIR HFA) 108 (90 Base) MCG/ACT inhaler; Use 2 inhalations    15 minutes   every 4 hours      to rescue Asthma  Influenza A -     oseltamivir (TAMIFLU) 75 MG capsule; Take 1 capsule (75 mg total) by mouth 2 (two) times daily for 10 days.   Push fluids, monitor pulse O2, Tylenol for aches and control of fever Go to the ER if you have difficulty breathing, O2 is less than 90%, Severe pain/stiffness in neck, Sudden sever  headache      Follow Up Instructions:  I discussed the assessment and treatment plan with the patient. The patient was provided an opportunity to ask questions and all were answered. The patient agreed with the plan and demonstrated an understanding of the instructions.   The patient was advised to call back or seek an in-person evaluation if the symptoms worsen or if the condition fails to improve as anticipated.  I provided 20 minutes of non-face-to-face time during this encounter.   Magda Bernheim, NP

## 2021-08-16 ENCOUNTER — Telehealth: Payer: Self-pay

## 2021-08-16 ENCOUNTER — Other Ambulatory Visit: Payer: Self-pay | Admitting: Nurse Practitioner

## 2021-08-16 ENCOUNTER — Other Ambulatory Visit: Payer: Self-pay

## 2021-08-16 ENCOUNTER — Ambulatory Visit
Admission: RE | Admit: 2021-08-16 | Discharge: 2021-08-16 | Disposition: A | Discharge status: Home / Self Care | Payer: Medicare Other | Source: Ambulatory Visit | Attending: Nurse Practitioner | Admitting: Nurse Practitioner

## 2021-08-16 DIAGNOSIS — R059 Cough, unspecified: Secondary | ICD-10-CM | POA: Diagnosis not present

## 2021-08-16 DIAGNOSIS — I10 Essential (primary) hypertension: Secondary | ICD-10-CM

## 2021-08-16 DIAGNOSIS — R051 Acute cough: Secondary | ICD-10-CM

## 2021-08-16 MED ORDER — POTASSIUM CHLORIDE CRYS ER 20 MEQ PO TBCR: EXTENDED_RELEASE_TABLET | ORAL | 0 refills | Status: DC

## 2021-08-16 MED ORDER — BENZONATATE 100 MG PO CAPS: 200.0000 mg | ORAL_CAPSULE | Freq: Three times a day (TID) | ORAL | 0 refills | Status: DC | PRN

## 2021-08-16 NOTE — Telephone Encounter (Signed)
We do not give codeine cough syrup.  I have sent in [facility]

## 2021-08-16 NOTE — Telephone Encounter (Signed)
Patient said her cough is really bad and wants something stronger to suppress her cough.  Margaret Nelson

## 2021-08-17 ENCOUNTER — Other Ambulatory Visit: Payer: Self-pay | Admitting: Nurse Practitioner

## 2021-08-17 DIAGNOSIS — J189 Pneumonia, unspecified organism: Secondary | ICD-10-CM

## 2021-08-17 MED ORDER — CEFDINIR 300 MG PO CAPS: 300.0000 mg | ORAL_CAPSULE | Freq: Two times a day (BID) | ORAL | 0 refills | Status: DC

## 2021-08-17 MED ORDER — AZITHROMYCIN 250 MG PO TABS: ORAL_TABLET | ORAL | 1 refills | Status: DC

## 2021-08-17 NOTE — Progress Notes (Signed)
Pt needed scripts sent to [facility] in [place] instead of [place] [facility]. Meds resent

## 2021-09-05 ENCOUNTER — Other Ambulatory Visit: Payer: Self-pay | Admitting: Nurse Practitioner

## 2021-09-05 ENCOUNTER — Telehealth: Payer: Self-pay

## 2021-09-05 DIAGNOSIS — B370 Candidal stomatitis: Secondary | ICD-10-CM

## 2021-09-05 DIAGNOSIS — B3731 Acute candidiasis of vulva and vagina: Secondary | ICD-10-CM

## 2021-09-05 MED ORDER — NYSTATIN 100000 UNIT/ML MT SUSP: OROMUCOSAL | 0 refills | Status: DC

## 2021-09-05 MED ORDER — FLUCONAZOLE 150 MG PO TABS: ORAL_TABLET | ORAL | 0 refills | Status: DC

## 2021-09-05 NOTE — Telephone Encounter (Signed)
I sent in Diflucan 1 tab day 1 and day 4.  Also Nystatin swish and swallow. If no improvement with meds schedule an appointment

## 2021-09-05 NOTE — Telephone Encounter (Signed)
Been on antibiotics for pneumonia and flu A, now she thinks she has a yeast infection and thrush. Can we call her in something?

## 2021-09-06 ENCOUNTER — Other Ambulatory Visit: Payer: Self-pay

## 2021-09-06 DIAGNOSIS — B370 Candidal stomatitis: Secondary | ICD-10-CM

## 2021-09-06 DIAGNOSIS — B3731 Acute candidiasis of vulva and vagina: Secondary | ICD-10-CM

## 2021-09-06 MED ORDER — FLUCONAZOLE 150 MG PO TABS: ORAL_TABLET | ORAL | 0 refills | Status: DC

## 2021-09-06 MED ORDER — NYSTATIN 100000 UNIT/ML MT SUSP: OROMUCOSAL | 0 refills | Status: DC

## 2021-09-30 ENCOUNTER — Other Ambulatory Visit: Payer: Self-pay

## 2021-09-30 DIAGNOSIS — K2100 Gastro-esophageal reflux disease with esophagitis, without bleeding: Secondary | ICD-10-CM

## 2021-09-30 MED ORDER — FAMOTIDINE 40 MG PO TABS: ORAL_TABLET | ORAL | 3 refills | Status: DC

## 2021-10-03 NOTE — Progress Notes (Signed)
° ° °  Patent discovered  she had lost her wallet &  panicked & left  before seen  !

## 2021-10-04 ENCOUNTER — Ambulatory Visit: Payer: Medicare Other | Admitting: Internal Medicine

## 2021-10-04 ENCOUNTER — Encounter: Payer: Self-pay | Admitting: Internal Medicine

## 2021-10-04 ENCOUNTER — Other Ambulatory Visit: Payer: Self-pay

## 2021-10-04 VITALS — BP 118/80 | HR 89 | Temp 97.9°F | Resp 16 | Ht 64.0 in | Wt 140.6 lb

## 2021-10-04 DIAGNOSIS — R69 Illness, unspecified: Secondary | ICD-10-CM

## 2021-10-11 ENCOUNTER — Other Ambulatory Visit: Payer: Self-pay | Admitting: Internal Medicine

## 2021-10-11 DIAGNOSIS — E079 Disorder of thyroid, unspecified: Secondary | ICD-10-CM

## 2021-11-01 ENCOUNTER — Encounter: Payer: Medicare Other | Admitting: Internal Medicine

## 2021-11-14 DIAGNOSIS — Z20822 Contact with and (suspected) exposure to covid-19: Secondary | ICD-10-CM | POA: Diagnosis not present

## 2021-11-18 DIAGNOSIS — Z20822 Contact with and (suspected) exposure to covid-19: Secondary | ICD-10-CM | POA: Diagnosis not present

## 2021-11-21 DIAGNOSIS — Z20828 Contact with and (suspected) exposure to other viral communicable diseases: Secondary | ICD-10-CM | POA: Diagnosis not present

## 2021-11-26 ENCOUNTER — Other Ambulatory Visit: Payer: Self-pay | Admitting: Internal Medicine

## 2021-11-26 DIAGNOSIS — R051 Acute cough: Secondary | ICD-10-CM

## 2021-11-26 MED ORDER — PROMETHAZINE-DM 6.25-15 MG/5ML PO SYRP: 5.0000 mL | ORAL_SOLUTION | Freq: Four times a day (QID) | ORAL | 1 refills | Status: DC | PRN

## 2021-11-26 MED ORDER — PSEUDOEPHEDRINE HCL ER 120 MG PO TB12: ORAL_TABLET | ORAL | 1 refills | Status: DC

## 2021-11-26 MED ORDER — BENZONATATE 200 MG PO CAPS: ORAL_CAPSULE | ORAL | 1 refills | Status: DC

## 2021-11-26 MED ORDER — AZITHROMYCIN 250 MG PO TABS: ORAL_TABLET | ORAL | 1 refills | Status: DC

## 2021-11-26 MED ORDER — DEXAMETHASONE 4 MG PO TABS: ORAL_TABLET | ORAL | 0 refills | Status: DC

## 2021-11-28 DIAGNOSIS — Z20822 Contact with and (suspected) exposure to covid-19: Secondary | ICD-10-CM | POA: Diagnosis not present

## 2021-11-29 ENCOUNTER — Other Ambulatory Visit: Payer: Self-pay | Admitting: Nurse Practitioner

## 2021-11-29 DIAGNOSIS — I10 Essential (primary) hypertension: Secondary | ICD-10-CM

## 2021-12-08 DIAGNOSIS — H52223 Regular astigmatism, bilateral: Secondary | ICD-10-CM | POA: Diagnosis not present

## 2021-12-08 DIAGNOSIS — H5203 Hypermetropia, bilateral: Secondary | ICD-10-CM | POA: Diagnosis not present

## 2021-12-08 DIAGNOSIS — H2513 Age-related nuclear cataract, bilateral: Secondary | ICD-10-CM | POA: Diagnosis not present

## 2021-12-08 DIAGNOSIS — H524 Presbyopia: Secondary | ICD-10-CM | POA: Diagnosis not present

## 2021-12-15 NOTE — Progress Notes (Signed)
?MEDICARE ANNUAL WELLNESS VISIT AND 3 MONTH FOLLOW UP ? ?Assessment:  ? ?Encounter for Medicare Annual Wellness visit ?Due Yearly ?Declines Mammogram, DEXA and colonoscopy ? ?Essential hypertension ?Discussed dietary and exercise modifications ?-     CBC with Differential/Platelet ?-     COMPLETE METABOLIC PANEL WITH GFR ? ?Extrinsic asthma without complication, unspecified asthma severity, unspecified whether persistent ?Has rescue inhaler ?Continue Breo daily  ?STOP SMOKING ? ?Gastroesophageal reflux disease, esophagitis presence not specified ?Well managed on current medications ?Discussed diet, avoiding triggers and other lifestyle changes ? ?Hypothyroidism ?continue medications the same pending lab results ?reminded to take on an empty stomach 30-15mns before food.  ?-     TSH ? ?Depression, major, recurrent, in partial remission [place] ?Taking Wellbutrin and lexapro ?Dr. KToy Caremanaging; overdue follow up; encouraged to schedule ? ?Hyperlipidemia, mixed ?Continue medications ?Continue low cholesterol diet and exercise.  ?Check lipid panel.  ?-     Lipid panel ? ?Vitamin D deficiency ?Continue Vit D supplementation to maintain value in therapeutic level of 60-100  ? ?Abnormal glucose ?Discussed dietary and exercise modifications ?-     Hemoglobin A1c ? ?Smoker ?Discussed smoking cessation and resources ?She does not smoke everyday- only 5 cigarettes a week. ? ?-lung cancer screening with low dose CT discussed as recommended by guidelines based on age, number of pack year history.  Discussed risks of screening including but not limited to false positives on xray, further testing or consultation with specialist, and possible false negative CT as well. Understanding expressed and wishes to proceed with CT testing. Order placed.  ? ?Body mass index (BMI) of 22 in adult ?Discussed dietary and exercise modifications ? ?Medication management ?-     CBC with Differential/Platelet ?-     COMPLETE METABOLIC PANEL WITH  GFR ? ?Seasonal Allergies ?- Zyrtec 10 mg QD ?- Flonase 2 sprays each nostril daily ?- Mucinex as needed ? ?Impacted cerumen left ear ?- ear lavage of left ear-  ?- Debrox 5 drops to left ear nightly, irrigate with warm water. ? ? ?Over 40 minutes of exam, counseling, chart review and critical decision making was performed ?Future Appointments  ?Date Time Provider [place] ?12/20/2022  4:00 PM MMagda Bernheim NP [facility] None  ? ?Plan:  ? ?During the course of the visit the patient was educated and counseled about appropriate screening and preventive services including:  ? ?Pneumococcal vaccine  ?Prevnar 13 ?Influenza vaccine ?Td vaccine ?Screening electrocardiogram ?Bone densitometry screening ?Colorectal cancer screening ?Diabetes screening ?Glaucoma screening ?Nutrition counseling  ?Advanced directives: requested  ? ?Subjective:  ?Margaret PRIEGOis a 71y.o. female who presents for 3 month follow up.  ? ?Patient is on SS disability for a long hx/o severe Depression circa 1990's w/hx/o ECT x 9 in 2008. She is seeing Dr PJeneen Montgomery  She was also at one time in a long term toxic relationship with a bipolar Manic Depressive- She is now living with her daughter and not in the relationship. Prescribed lexapro 10 mg, wellbutrin 150 mg TID, PRN Xanax.  ? ?Current some day smoker, hx of asthma, had emphysematous changes noted in lung bases per CT abd 06/2020. She has 30+ pack year history on review, smoking since she was 89 currently intermittently. Has never had Ct lung cancer screening, willing to have low dose CT.  ? ?Still having coughing but has improved, believes it can be related to allergies.  ? ?BMI is Body mass index is 22.25 kg/m?., she is working  on diet and exercise. She is eating better, will monitor weight. ?Wt Readings from Last 3 Encounters:  ?12/19/21 129 lb 9.6 oz (58.8 kg)  ?10/04/21 140 lb 9.6 oz (63.8 kg)  ?04/04/21 145 lb 3.2 oz (65.9 kg)  ? ?She has had elevated blood pressure since 2008.  Her blood pressure has been controlled at home, today their BP is BP: 102/62  ?BP Readings from Last 3 Encounters:  ?12/19/21 102/62  ?10/04/21 118/80  ?04/04/21 124/74  ? ? ?She does not workout. She denies chest pain, shortness of breath, dizziness.  ? ?She has aortic atherosclerosis per CT 06/2020.  ? ?She is on cholesterol medication and denies myalgias. Her cholesterol is not at goal. The cholesterol last visit was:   ?Lab Results  ?Component Value Date  ? CHOL 146 04/04/2021  ? HDL 55 04/04/2021  ? [place] 67 04/04/2021  ? TRIG 153 (H) 04/04/2021  ? CHOLHDL 2.7 04/04/2021  ? ?She has had pre diabetes with elevated A1c of 5.7 in 2012 and highest at 5.9 in 2016. She has not been working on diet and exercise for prediabetes/abnormal glucose, and denies hyperglycemia, hypoglycemia , nausea, polydipsia and polyuria. Last A1C in the office was:  ?Lab Results  ?Component Value Date  ? HGBA1C 5.3 04/04/2021  ? ?Last GFR: ?Lab Results  ?Component Value Date  ? GFRNONAA 90 11/15/2020  ? ?Patient is on Vitamin D supplement, taking 5000 IU   ?Lab Results  ?Component Value Date  ? VD25OH 58 04/04/2021  ?   ?She is on thyroid medication. Her medication was not changed last visit.   ?Lab Results  ?Component Value Date  ? TSH 0.25 (L) 04/04/2021  ?.  ? ? ?She reports taking 65 mg slow release iron recently due to hair loss concerns ?Lab Results  ?Component Value Date  ? IRON 108 11/15/2020  ? TIBC 329 11/15/2020  ? FERRITIN 51 11/15/2020  ? ?Lab Results  ?Component Value Date  ? WBC 7.7 04/04/2021  ? HGB 12.9 04/04/2021  ? HCT 39.4 04/04/2021  ? MCV 93.8 04/04/2021  ? PLT 355 04/04/2021  ? ? ?Lab Results  ?Component Value Date  ? WUJWJXBJ47 1,512 (H) 11/15/2020  ? ? ? ?Medication Review: ? ?Current Outpatient Medications (Endocrine & Metabolic):  ?  levothyroxine (SYNTHROID) 75 MCG tablet, TAKE ONE TABLET BY MOUTH DAILY ON AN EMPTY STOMACH WITH ONLY WATER FOR 30 MINUTES AND NO ANTACID MEDS, CALCIUM OR MAGNESIUM FOR 4 HOURS  AND AVOID BIOTIN ?  dexamethasone (DECADRON) 4 MG tablet, Take 1 tab 3 x day - 3 days, then 2 x day - 3 days, then 1 tab daily (Patient not taking: Reported on 12/19/2021) ? ?Current Outpatient Medications (Cardiovascular):  ?  atorvastatin (LIPITOR) 40 MG tablet, Take 1 tablet   every other day (even days) for Cholesterol / Patient knows to take by mouth ?  bumetanide (BUMEX) 2 MG tablet, Take  1 tablet  2 x /day  for BP & Fluid Retention /Ankle Swelling ? ?Current Outpatient Medications (Respiratory):  ?  albuterol (PROAIR HFA) 108 (90 Base) MCG/ACT inhaler, Use 2 inhalations    15 minutes   every 4 hours      to rescue Asthma ?  benzonatate (TESSALON) 200 MG capsule, Take 1 perle  3 x/day  to Prevent Cough ?  promethazine-dextromethorphan (PROMETHAZINE-DM) 6.25-15 MG/5ML syrup, Take 5 mLs by mouth 4 (four) times daily as needed for cough. ?  pseudoephedrine (SUDAFED) 120 MG 12 hr tablet, Take  1 tablet  2 x /day (every 12 hours)  for Sinus & Chest Congestion (Patient not taking: Reported on 12/19/2021) ? ?Current Outpatient Medications (Analgesics):  ?  acetaminophen (TYLENOL) 500 MG tablet, Take 500 mg by mouth every 6 (six) hours as needed. ? ? ?Current Outpatient Medications (Other):  ?  amphetamine-dextroamphetamine (ADDERALL) 20 MG tablet, Takes 1 tab 2 x /day per Dr Toy Care ?  ARIPiprazole (ABILIFY) 2 MG tablet, Take 2 mg by mouth at bedtime. ?  bimatoprost (LATISSE) 0.03 % ophthalmic solution, 1 drop on applicator, apply evenly along the skin of the upper eyelid at base of eyelashes at bedtime; repeat procedure for second eye ?  Cholecalciferol (VITAMIN D PO), Take 5,000 Units by mouth daily. 10,000 ?  famotidine (PEPCID) 40 MG tablet, TAKE 1 TABLET BY MOUTH EVERYDAY AT BEDTIME ?  Flaxseed, Linseed, (FLAX SEED OIL) 1300 MG CAPS, Take 1,300 mg by mouth 3 (three) times daily. ?  Magnesium 500 MG TABS, Take by mouth. Takes 4 tablets daily. ?  Multiple Vitamin (MULTIVITAMIN) tablet, Take 1 tablet by mouth daily. ?   nystatin (MYCOSTATIN) 100000 UNIT/ML suspension, 5 ml four times a day, retain in mouth as long as possible (Swish and Spit).  Use for 48 hours after symptoms resolve. ?  Omega-3 Fatty Acids (FISH OIL) 10

## 2021-12-16 DIAGNOSIS — Z20822 Contact with and (suspected) exposure to covid-19: Secondary | ICD-10-CM | POA: Diagnosis not present

## 2021-12-19 ENCOUNTER — Other Ambulatory Visit: Payer: Self-pay

## 2021-12-19 ENCOUNTER — Encounter: Payer: Self-pay | Admitting: Nurse Practitioner

## 2021-12-19 ENCOUNTER — Ambulatory Visit (INDEPENDENT_AMBULATORY_CARE_PROVIDER_SITE_OTHER): Payer: Medicare Other | Admitting: Nurse Practitioner

## 2021-12-19 VITALS — BP 102/62 | HR 101 | Temp 97.7°F | Wt 129.6 lb

## 2021-12-19 DIAGNOSIS — J449 Chronic obstructive pulmonary disease, unspecified: Secondary | ICD-10-CM

## 2021-12-19 DIAGNOSIS — H6122 Impacted cerumen, left ear: Secondary | ICD-10-CM | POA: Diagnosis not present

## 2021-12-19 DIAGNOSIS — Z0001 Encounter for general adult medical examination with abnormal findings: Secondary | ICD-10-CM

## 2021-12-19 DIAGNOSIS — R6889 Other general symptoms and signs: Secondary | ICD-10-CM | POA: Diagnosis not present

## 2021-12-19 DIAGNOSIS — J302 Other seasonal allergic rhinitis: Secondary | ICD-10-CM

## 2021-12-19 DIAGNOSIS — R7309 Other abnormal glucose: Secondary | ICD-10-CM | POA: Diagnosis not present

## 2021-12-19 DIAGNOSIS — E079 Disorder of thyroid, unspecified: Secondary | ICD-10-CM | POA: Diagnosis not present

## 2021-12-19 DIAGNOSIS — I700 Atherosclerosis of aorta: Secondary | ICD-10-CM | POA: Diagnosis not present

## 2021-12-19 DIAGNOSIS — F3341 Major depressive disorder, recurrent, in partial remission: Secondary | ICD-10-CM

## 2021-12-19 DIAGNOSIS — K2100 Gastro-esophageal reflux disease with esophagitis, without bleeding: Secondary | ICD-10-CM

## 2021-12-19 DIAGNOSIS — E559 Vitamin D deficiency, unspecified: Secondary | ICD-10-CM

## 2021-12-19 DIAGNOSIS — J4521 Mild intermittent asthma with (acute) exacerbation: Secondary | ICD-10-CM

## 2021-12-19 DIAGNOSIS — I10 Essential (primary) hypertension: Secondary | ICD-10-CM

## 2021-12-19 DIAGNOSIS — F17200 Nicotine dependence, unspecified, uncomplicated: Secondary | ICD-10-CM

## 2021-12-19 DIAGNOSIS — Z79899 Other long term (current) drug therapy: Secondary | ICD-10-CM | POA: Diagnosis not present

## 2021-12-19 DIAGNOSIS — Z6822 Body mass index (BMI) 22.0-22.9, adult: Secondary | ICD-10-CM

## 2021-12-19 DIAGNOSIS — Z87891 Personal history of nicotine dependence: Secondary | ICD-10-CM

## 2021-12-19 DIAGNOSIS — E782 Mixed hyperlipidemia: Secondary | ICD-10-CM

## 2021-12-19 MED ORDER — CETIRIZINE HCL 10 MG PO TABS: 10.0000 mg | ORAL_TABLET | Freq: Every day | ORAL | 2 refills | Status: AC

## 2021-12-19 MED ORDER — CARBAMIDE PEROXIDE 6.5 % OT SOLN
5.0000 [drp] | Freq: Two times a day (BID) | OTIC | 2 refills | Status: AC
Start: 2021-12-19 — End: 2022-12-19

## 2021-12-19 MED ORDER — FLUTICASONE PROPIONATE 50 MCG/ACT NA SUSP
2.0000 | Freq: Every day | NASAL | 2 refills | Status: AC
Start: 2021-12-19 — End: 2022-12-19

## 2021-12-19 NOTE — Patient Instructions (Signed)
Allergies, Adult ?An allergy means that your body reacts to something that bothers it (allergen). This can happen from something that you eat, breathe in, or touch. ?Allergies often affect the nose, eyes, skin, and stomach. They can be mild, moderate, or very bad (severe). An allergy cannot spread from person to person. They can happen at any age. Sometimes, people outgrow them. ?What are the causes? ?Outdoor things, such as pollen, car fumes, and mold. ?Indoor things, such as dust, smoke, mold, and pets. ?Foods. ?Medicines. ?Things that bother your skin, such as perfume and bug bites. ?What increases the risk? ?Having family members with allergies or asthma. ?What are the signs or symptoms? ?Symptoms depend on how bad your allergy is. ?Mild to moderate symptoms ?Runny nose, stuffy nose, or sneezing. ?Itchy mouth, ears, or throat. ?A feeling of mucus dripping down the back of your throat. ?Sore throat. ?Eyes that are itchy, red, watery, or puffy. ?A skin rash, or red, swollen areas of skin (hives). ?Stomach cramps or bloating. ?Severe symptoms ?Very bad allergies to food, medicine, or bug bites may cause a very bad allergy reaction (anaphylaxis). This can be life-threatening. Symptoms include: ?A red face. ?Wheezing or coughing. ?Swollen lips, tongue, or mouth. ?Tight or swollen throat. ?Chest pain or tightness, or a fast heartbeat. ?Trouble breathing or shortness of breath. ?Pain in your belly (abdomen), vomiting, or watery poop (diarrhea). ?Feeling dizzy or fainting. ?How is this treated? ?  ?Treatment for this condition depends on your symptoms. Treatment may include: ?Cold, wet cloths for itching and swelling. ?Eye drops, nose sprays, or skin creams. ?Washing out your nose each day. ?A humidifier. ?Medicines. ?A change to the foods you eat. ?Being exposed again and again to tiny amounts of allergens. This helps your body get used to them. You might have: ?Allergy shots. ?Very small amounts of allergen put under  your tongue. ?An emergency shot (auto-injector pen) if you have a very bad allergy reaction. ?This is a medicine with a needle. You can put it into your skin by yourself. ?Your doctor will teach you how to use it. ?Follow these instructions at home: ?Medicines ? ?Take or apply over-the-counter and prescription medicines only as told by your doctor. ?If you are at risk for a very bad allergy reaction, keep an auto-injector pen with you all the time. ?Eating and drinking ?Follow instructions from your doctor about what to eat and drink. ?Drink enough fluid to keep your pee (urine) pale yellow. ?General instructions ?If you have ever had a very bad allergy reaction, wear a medical alert bracelet or necklace. ?Stay away from things that you are allergic to. ?Keep all follow-up visits as told by your doctor. This is important. ?Contact a doctor if: ?Your symptoms do not get better with treatment. ?Get help right away if: ?You have symptoms of a very bad allergy reaction. These include: ?A swollen mouth, tongue, or throat. ?Pain or tightness in your chest. ?Trouble breathing. ?Being short of breath. ?Dizziness. ?Fainting. ?Very bad pain in your belly. ?Vomiting. ?Watery poop. ?These symptoms may be an emergency. Do not wait to see if the symptoms will go away. Get medical help right away. Call your local emergency services (911 in the [place] Do not drive yourself to the hospital. ?Summary ?Take or apply over-the-counter and prescription medicines only as told by your doctor. ?Stay away from things you are allergic to. ?If you are at risk for a very bad allergy reaction, carry an auto-injector pen all the time. ?Wear a  medical alert bracelet or necklace. ?Very bad allergy reactions can be life-threatening. Get help right away. ?This information is not intended to replace advice given to you by your health care provider. Make sure you discuss any questions you have with your health care provider. ?Document Revised:  07/23/2019 Document Reviewed: 07/23/2019 ?Elsevier Patient Education ? [place]. ? ?

## 2021-12-20 LAB — CBC WITH DIFFERENTIAL/PLATELET
Absolute Monocytes: 352 cells/uL (ref 200–950)
Basophils Absolute: 48 cells/uL (ref 0–200)
Basophils Relative: 0.7 %
Eosinophils Absolute: 138 cells/uL (ref 15–500)
Eosinophils Relative: 2 %
HCT: 40.1 % (ref 35.0–45.0)
Hemoglobin: 13.2 g/dL (ref 11.7–15.5)
Lymphs Abs: 2926 cells/uL (ref 850–3900)
MCH: 29.7 pg (ref 27.0–33.0)
MCHC: 32.9 g/dL (ref 32.0–36.0)
MCV: 90.1 fL (ref 80.0–100.0)
MPV: 10.6 fL (ref 7.5–12.5)
Monocytes Relative: 5.1 %
Neutro Abs: 3436 cells/uL (ref 1500–7800)
Neutrophils Relative %: 49.8 %
Platelets: 359 10*3/uL (ref 140–400)
RBC: 4.45 10*6/uL (ref 3.80–5.10)
RDW: 13.6 % (ref 11.0–15.0)
Total Lymphocyte: 42.4 %
WBC: 6.9 10*3/uL (ref 3.8–10.8)

## 2021-12-20 LAB — COMPLETE METABOLIC PANEL WITH GFR
AG Ratio: 1.1 (calc) (ref 1.0–2.5)
ALT: 16 U/L (ref 6–29)
AST: 13 U/L (ref 10–35)
Albumin: 3.7 g/dL (ref 3.6–5.1)
Alkaline phosphatase (APISO): 94 U/L (ref 37–153)
BUN: 17 mg/dL (ref 7–25)
CO2: 32 mmol/L (ref 20–32)
Calcium: 9.5 mg/dL (ref 8.6–10.4)
Chloride: 99 mmol/L (ref 98–110)
Creat: 0.75 mg/dL (ref 0.50–1.05)
Globulin: 3.3 g/dL (calc) (ref 1.9–3.7)
Glucose, Bld: 97 mg/dL (ref 65–99)
Potassium: 4.3 mmol/L (ref 3.5–5.3)
Sodium: 140 mmol/L (ref 135–146)
Total Bilirubin: 0.3 mg/dL (ref 0.2–1.2)
Total Protein: 7 g/dL (ref 6.1–8.1)
eGFR: 86 mL/min/{1.73_m2} (ref >=60)

## 2021-12-20 LAB — LIPID PANEL
Cholesterol: 151 mg/dL (ref <200)
HDL: 61 mg/dL (ref >=50)
LDL Cholesterol (Calc): 68 mg/dL (calc)
Non-HDL Cholesterol (Calc): 90 mg/dL (calc) (ref <130)
Total CHOL/HDL Ratio: 2.5 (calc) (ref <5.0)
Triglycerides: 139 mg/dL (ref <150)

## 2021-12-20 LAB — HEMOGLOBIN A1C
Hgb A1c MFr Bld: 5.9 % of total Hgb — ABNORMAL HIGH (ref <5.7)
Mean Plasma Glucose: 123 mg/dL
eAG (mmol/L): 6.8 mmol/L

## 2021-12-20 LAB — TSH: TSH: 1.15 mIU/L (ref 0.40–4.50)

## 2021-12-20 NOTE — Progress Notes (Signed)
Patient is aware of lab results and instructions. -e welch

## 2021-12-21 DIAGNOSIS — Z20822 Contact with and (suspected) exposure to covid-19: Secondary | ICD-10-CM | POA: Diagnosis not present

## 2021-12-26 ENCOUNTER — Other Ambulatory Visit: Payer: Self-pay | Admitting: Internal Medicine

## 2021-12-26 DIAGNOSIS — I10 Essential (primary) hypertension: Secondary | ICD-10-CM

## 2021-12-26 DIAGNOSIS — D3131 Benign neoplasm of right choroid: Secondary | ICD-10-CM | POA: Diagnosis not present

## 2021-12-26 DIAGNOSIS — H2513 Age-related nuclear cataract, bilateral: Secondary | ICD-10-CM | POA: Diagnosis not present

## 2021-12-26 DIAGNOSIS — H25013 Cortical age-related cataract, bilateral: Secondary | ICD-10-CM | POA: Diagnosis not present

## 2021-12-26 DIAGNOSIS — R609 Edema, unspecified: Secondary | ICD-10-CM

## 2021-12-26 DIAGNOSIS — Z973 Presence of spectacles and contact lenses: Secondary | ICD-10-CM | POA: Diagnosis not present

## 2021-12-26 DIAGNOSIS — H5203 Hypermetropia, bilateral: Secondary | ICD-10-CM | POA: Diagnosis not present

## 2021-12-26 DIAGNOSIS — H52223 Regular astigmatism, bilateral: Secondary | ICD-10-CM | POA: Diagnosis not present

## 2021-12-26 DIAGNOSIS — H524 Presbyopia: Secondary | ICD-10-CM | POA: Diagnosis not present

## 2021-12-26 DIAGNOSIS — H43813 Vitreous degeneration, bilateral: Secondary | ICD-10-CM | POA: Diagnosis not present

## 2021-12-26 MED ORDER — BUMETANIDE 2 MG PO TABS: ORAL_TABLET | ORAL | 3 refills | Status: DC

## 2022-01-04 ENCOUNTER — Other Ambulatory Visit: Payer: Self-pay

## 2022-01-04 DIAGNOSIS — I10 Essential (primary) hypertension: Secondary | ICD-10-CM

## 2022-01-04 MED ORDER — POTASSIUM CHLORIDE CRYS ER 20 MEQ PO TBCR: EXTENDED_RELEASE_TABLET | ORAL | 0 refills | Status: DC

## 2022-01-16 ENCOUNTER — Inpatient Hospital Stay: Admission: RE | Admit: 2022-01-16 | Payer: Medicare Other | Source: Ambulatory Visit

## 2022-01-17 DIAGNOSIS — Z20822 Contact with and (suspected) exposure to covid-19: Secondary | ICD-10-CM | POA: Diagnosis not present

## 2022-01-18 DIAGNOSIS — Z20822 Contact with and (suspected) exposure to covid-19: Secondary | ICD-10-CM | POA: Diagnosis not present

## 2022-01-20 ENCOUNTER — Other Ambulatory Visit: Payer: Self-pay | Admitting: Internal Medicine

## 2022-01-20 ENCOUNTER — Other Ambulatory Visit: Payer: Self-pay | Admitting: Adult Health

## 2022-01-20 DIAGNOSIS — I10 Essential (primary) hypertension: Secondary | ICD-10-CM

## 2022-01-20 DIAGNOSIS — R609 Edema, unspecified: Secondary | ICD-10-CM

## 2022-01-21 DIAGNOSIS — Z20822 Contact with and (suspected) exposure to covid-19: Secondary | ICD-10-CM | POA: Diagnosis not present

## 2022-02-06 ENCOUNTER — Ambulatory Visit
Admission: RE | Admit: 2022-02-06 | Discharge: 2022-02-06 | Disposition: A | Discharge status: Home / Self Care | Payer: Medicare Other | Source: Ambulatory Visit | Attending: Nurse Practitioner | Admitting: Nurse Practitioner

## 2022-02-06 DIAGNOSIS — F17210 Nicotine dependence, cigarettes, uncomplicated: Secondary | ICD-10-CM | POA: Diagnosis not present

## 2022-02-06 DIAGNOSIS — F17200 Nicotine dependence, unspecified, uncomplicated: Secondary | ICD-10-CM

## 2022-02-06 DIAGNOSIS — Z87891 Personal history of nicotine dependence: Secondary | ICD-10-CM

## 2022-02-08 ENCOUNTER — Other Ambulatory Visit: Payer: Self-pay | Admitting: Nurse Practitioner

## 2022-02-08 DIAGNOSIS — R911 Solitary pulmonary nodule: Secondary | ICD-10-CM

## 2022-02-08 DIAGNOSIS — I10 Essential (primary) hypertension: Secondary | ICD-10-CM

## 2022-02-08 MED ORDER — POTASSIUM CHLORIDE CRYS ER 20 MEQ PO TBCR: EXTENDED_RELEASE_TABLET | ORAL | 0 refills | Status: DC

## 2022-02-08 NOTE — Addendum Note (Signed)
Addended by: Magda Bernheim on: 02/08/2022 10:02 AM ? ? Modules accepted: Orders ? ?

## 2022-02-10 ENCOUNTER — Ambulatory Visit (HOSPITAL_COMMUNITY): Payer: Medicare Other

## 2022-02-15 ENCOUNTER — Ambulatory Visit (HOSPITAL_COMMUNITY): Payer: Medicare Other

## 2022-02-21 DIAGNOSIS — Z79899 Other long term (current) drug therapy: Secondary | ICD-10-CM | POA: Diagnosis not present

## 2022-02-21 DIAGNOSIS — F17210 Nicotine dependence, cigarettes, uncomplicated: Secondary | ICD-10-CM | POA: Diagnosis not present

## 2022-02-21 DIAGNOSIS — H2512 Age-related nuclear cataract, left eye: Secondary | ICD-10-CM | POA: Diagnosis not present

## 2022-02-21 DIAGNOSIS — H2511 Age-related nuclear cataract, right eye: Secondary | ICD-10-CM | POA: Diagnosis not present

## 2022-02-21 DIAGNOSIS — Z880 Allergy status to penicillin: Secondary | ICD-10-CM | POA: Diagnosis not present

## 2022-02-21 DIAGNOSIS — Z885 Allergy status to narcotic agent status: Secondary | ICD-10-CM | POA: Diagnosis not present

## 2022-02-21 DIAGNOSIS — Z9049 Acquired absence of other specified parts of digestive tract: Secondary | ICD-10-CM | POA: Diagnosis not present

## 2022-02-21 DIAGNOSIS — F32A Depression, unspecified: Secondary | ICD-10-CM | POA: Diagnosis not present

## 2022-02-21 DIAGNOSIS — E785 Hyperlipidemia, unspecified: Secondary | ICD-10-CM | POA: Diagnosis not present

## 2022-02-21 DIAGNOSIS — K219 Gastro-esophageal reflux disease without esophagitis: Secondary | ICD-10-CM | POA: Diagnosis not present

## 2022-02-21 DIAGNOSIS — J449 Chronic obstructive pulmonary disease, unspecified: Secondary | ICD-10-CM | POA: Diagnosis not present

## 2022-02-21 DIAGNOSIS — E039 Hypothyroidism, unspecified: Secondary | ICD-10-CM | POA: Diagnosis not present

## 2022-02-21 DIAGNOSIS — F419 Anxiety disorder, unspecified: Secondary | ICD-10-CM | POA: Diagnosis not present

## 2022-02-21 DIAGNOSIS — H25012 Cortical age-related cataract, left eye: Secondary | ICD-10-CM | POA: Diagnosis not present

## 2022-02-21 DIAGNOSIS — H25812 Combined forms of age-related cataract, left eye: Secondary | ICD-10-CM | POA: Diagnosis not present

## 2022-02-22 DIAGNOSIS — Z961 Presence of intraocular lens: Secondary | ICD-10-CM | POA: Diagnosis not present

## 2022-02-22 DIAGNOSIS — H2511 Age-related nuclear cataract, right eye: Secondary | ICD-10-CM | POA: Diagnosis not present

## 2022-02-22 DIAGNOSIS — H25011 Cortical age-related cataract, right eye: Secondary | ICD-10-CM | POA: Diagnosis not present

## 2022-03-01 ENCOUNTER — Ambulatory Visit (HOSPITAL_COMMUNITY): Admission: RE | Admit: 2022-03-01 | Payer: Medicare Other | Source: Ambulatory Visit

## 2022-03-01 ENCOUNTER — Encounter (HOSPITAL_COMMUNITY): Payer: Self-pay

## 2022-03-07 DIAGNOSIS — E039 Hypothyroidism, unspecified: Secondary | ICD-10-CM | POA: Diagnosis not present

## 2022-03-07 DIAGNOSIS — Z885 Allergy status to narcotic agent status: Secondary | ICD-10-CM | POA: Diagnosis not present

## 2022-03-07 DIAGNOSIS — H25011 Cortical age-related cataract, right eye: Secondary | ICD-10-CM | POA: Diagnosis not present

## 2022-03-07 DIAGNOSIS — H52223 Regular astigmatism, bilateral: Secondary | ICD-10-CM | POA: Diagnosis not present

## 2022-03-07 DIAGNOSIS — H524 Presbyopia: Secondary | ICD-10-CM | POA: Diagnosis not present

## 2022-03-07 DIAGNOSIS — F419 Anxiety disorder, unspecified: Secondary | ICD-10-CM | POA: Diagnosis not present

## 2022-03-07 DIAGNOSIS — H25012 Cortical age-related cataract, left eye: Secondary | ICD-10-CM | POA: Diagnosis not present

## 2022-03-07 DIAGNOSIS — H43813 Vitreous degeneration, bilateral: Secondary | ICD-10-CM | POA: Diagnosis not present

## 2022-03-07 DIAGNOSIS — E785 Hyperlipidemia, unspecified: Secondary | ICD-10-CM | POA: Diagnosis not present

## 2022-03-07 DIAGNOSIS — J449 Chronic obstructive pulmonary disease, unspecified: Secondary | ICD-10-CM | POA: Diagnosis not present

## 2022-03-07 DIAGNOSIS — F32A Depression, unspecified: Secondary | ICD-10-CM | POA: Diagnosis not present

## 2022-03-07 DIAGNOSIS — H25811 Combined forms of age-related cataract, right eye: Secondary | ICD-10-CM | POA: Diagnosis not present

## 2022-03-07 DIAGNOSIS — Z880 Allergy status to penicillin: Secondary | ICD-10-CM | POA: Diagnosis not present

## 2022-03-07 DIAGNOSIS — Z79899 Other long term (current) drug therapy: Secondary | ICD-10-CM | POA: Diagnosis not present

## 2022-03-07 DIAGNOSIS — F17210 Nicotine dependence, cigarettes, uncomplicated: Secondary | ICD-10-CM | POA: Diagnosis not present

## 2022-03-07 DIAGNOSIS — H2511 Age-related nuclear cataract, right eye: Secondary | ICD-10-CM | POA: Diagnosis not present

## 2022-03-07 DIAGNOSIS — D3131 Benign neoplasm of right choroid: Secondary | ICD-10-CM | POA: Diagnosis not present

## 2022-03-07 DIAGNOSIS — H5201 Hypermetropia, right eye: Secondary | ICD-10-CM | POA: Diagnosis not present

## 2022-03-09 ENCOUNTER — Other Ambulatory Visit: Payer: Self-pay | Admitting: Nurse Practitioner

## 2022-03-09 DIAGNOSIS — I10 Essential (primary) hypertension: Secondary | ICD-10-CM

## 2022-03-10 ENCOUNTER — Ambulatory Visit (HOSPITAL_COMMUNITY)
Admission: RE | Admit: 2022-03-10 | Discharge: 2022-03-10 | Disposition: A | Discharge status: Home / Self Care | Payer: Medicare Other | Source: Ambulatory Visit | Attending: Nurse Practitioner | Admitting: Nurse Practitioner

## 2022-03-10 DIAGNOSIS — D259 Leiomyoma of uterus, unspecified: Secondary | ICD-10-CM | POA: Diagnosis not present

## 2022-03-10 DIAGNOSIS — R911 Solitary pulmonary nodule: Secondary | ICD-10-CM | POA: Insufficient documentation

## 2022-03-10 DIAGNOSIS — K449 Diaphragmatic hernia without obstruction or gangrene: Secondary | ICD-10-CM | POA: Diagnosis not present

## 2022-03-10 DIAGNOSIS — I2510 Atherosclerotic heart disease of native coronary artery without angina pectoris: Secondary | ICD-10-CM | POA: Diagnosis not present

## 2022-03-10 DIAGNOSIS — J432 Centrilobular emphysema: Secondary | ICD-10-CM | POA: Diagnosis not present

## 2022-03-10 LAB — GLUCOSE, CAPILLARY: Glucose-Capillary: 122 mg/dL — ABNORMAL HIGH (ref 70–99)

## 2022-03-10 MED ORDER — FLUDEOXYGLUCOSE F - 18 (FDG) INJECTION
6.5000 | Freq: Once | INTRAVENOUS | Status: AC
  Administered 2022-03-10: 6.4 via INTRAVENOUS

## 2022-03-13 ENCOUNTER — Other Ambulatory Visit: Payer: Self-pay | Admitting: Nurse Practitioner

## 2022-03-13 DIAGNOSIS — R911 Solitary pulmonary nodule: Secondary | ICD-10-CM

## 2022-03-13 DIAGNOSIS — R9389 Abnormal findings on diagnostic imaging of other specified body structures: Secondary | ICD-10-CM

## 2022-03-21 ENCOUNTER — Other Ambulatory Visit: Payer: Medicare Other

## 2022-03-24 ENCOUNTER — Other Ambulatory Visit: Payer: Self-pay | Admitting: Nurse Practitioner

## 2022-03-24 DIAGNOSIS — E079 Disorder of thyroid, unspecified: Secondary | ICD-10-CM

## 2022-03-27 NOTE — Progress Notes (Unsigned)
Assessment and Plan:  There are no diagnoses linked to this encounter.    Further disposition pending results of labs. Discussed med's effects and SE's.   Over 30 minutes of exam, counseling, chart review, and critical decision making was performed.   Future Appointments  Date Time Provider [place]  03/29/2022  2:00 PM Alycia Rossetti, NP [facility] None  05/09/2022  2:00 PM Alycia Rossetti, NP [facility] None  12/20/2022  4:00 PM Alycia Rossetti, NP [facility] None    ------------------------------------------------------------------------------------------------------------------   HPI There were no vitals taken for this visit. 71 y.o.female presents for  Past Medical History:  Diagnosis Date   Allergy    Anxiety    Aortic atherosclerosis ([place])    Arthritis    Asthma    as a child, out grew    Blood transfusion without reported diagnosis    Cancer ([place])    basal cell left temple, left side of nose   Depression    Diverticulosis    External hemorrhoids    GERD (gastroesophageal reflux disease)    Hiatal hernia    Hyperlipidemia    Hypertension    Internal hemorrhoids    Thyroid disease    hypo   Tubular adenoma of colon      Allergies  Allergen Reactions   Codeine Other (See Comments)    Abdominal pain   Dexamethasone Other (See Comments)    Sweating   Erythromycin Base     GI upset   Penicillins Rash    Current Outpatient Medications on File Prior to Visit  Medication Sig   acetaminophen (TYLENOL) 500 MG tablet Take 500 mg by mouth every 6 (six) hours as needed.   albuterol (PROAIR HFA) 108 (90 Base) MCG/ACT inhaler Use 2 inhalations    15 minutes   every 4 hours      to rescue Asthma   amphetamine-dextroamphetamine (ADDERALL) 20 MG tablet Takes 1 tab 2 x /day per Dr Toy Care   ARIPiprazole (ABILIFY) 2 MG tablet Take 2 mg by mouth at bedtime.   atorvastatin (LIPITOR) 40 MG tablet Take 1 tablet   every other day (even days) for Cholesterol /  Patient knows to take by mouth   benzonatate (TESSALON) 200 MG capsule Take 1 perle  3 x/day  to Prevent Cough   bimatoprost (LATISSE) 0.03 % ophthalmic solution 1 drop on applicator, apply evenly along the skin of the upper eyelid at base of eyelashes at bedtime; repeat procedure for second eye   bumetanide (BUMEX) 2 MG tablet TAKE ONE TABLET BY MOUTH TWICE A DAY FOR BLOOD PRESSURE AND FLUID RETENTION / ANKLE SWELLING   carbamide peroxide (DEBROX) 6.5 % OTIC solution Place 5 drops into the right ear 2 (two) times daily.   cetirizine (ZYRTEC ALLERGY) 10 MG tablet Take 1 tablet (10 mg total) by mouth daily.   Cholecalciferol (VITAMIN D PO) Take 5,000 Units by mouth daily. 10,000   famotidine (PEPCID) 40 MG tablet TAKE 1 TABLET BY MOUTH EVERYDAY AT BEDTIME   Flaxseed, Linseed, (FLAX SEED OIL) 1300 MG CAPS Take 1,300 mg by mouth 3 (three) times daily.   fluticasone (FLONASE) 50 MCG/ACT nasal spray Place 2 sprays into both nostrils daily.   levothyroxine (SYNTHROID) 75 MCG tablet 1 TAB DAILY ON EMPTY STOMACH WITH WATER X30 MIN. NO ANTACID, MEDS, OR BIOTIN FOR 4 HOURS   Magnesium 500 MG TABS Take by mouth. Takes 4 tablets daily.   Multiple Vitamin (MULTIVITAMIN) tablet Take 1 tablet by  mouth daily.   nystatin (MYCOSTATIN) 100000 UNIT/ML suspension 5 ml four times a day, retain in mouth as long as possible (Swish and Spit).  Use for 48 hours after symptoms resolve.   Omega-3 Fatty Acids (FISH OIL) 1000 MG CAPS Take 1 capsule (1,000 mg total) by mouth 3 (three) times daily.   pantoprazole (PROTONIX) 40 MG tablet TAKE ONE TABLET BY MOUTH DAILY TO PREVENT INDIGESTION AND ACID REFLUX   potassium chloride SA (KLOR-CON M20) 20 MEQ tablet TAKE ONE TABLET BY MOUTH THREE TIMES A DAY FOR POTASSIUM DEFICIENCY. NEEDS APPOINTMENT FOR REFILLS   promethazine-dextromethorphan (PROMETHAZINE-DM) 6.25-15 MG/5ML syrup Take 5 mLs by mouth 4 (four) times daily as needed for cough.   triamcinolone cream (KENALOG) 0.5 % Apply 1  application topically 2 (two) times daily.   valACYclovir (VALTREX) 500 MG tablet Take 2 tablets 2 x /day at Onset of Symptoms   Vilazodone HCl (VIIBRYD) 40 MG TABS Takes 1 tablet Daily per Dr Toy Care   No current facility-administered medications on file prior to visit.    ROS: all negative except above.   Physical Exam:  There were no vitals taken for this visit.  General Appearance: Well nourished, in no apparent distress. Eyes: PERRLA, EOMs, conjunctiva no swelling or erythema Sinuses: No Frontal/maxillary tenderness ENT/Mouth: Ext aud canals clear, TMs without erythema, bulging. No erythema, swelling, or exudate on post pharynx.  Tonsils not swollen or erythematous. Hearing normal.  Neck: Supple, thyroid normal.  Respiratory: Respiratory effort normal, BS equal bilaterally without rales, rhonchi, wheezing or stridor.  Cardio: RRR with no MRGs. Brisk peripheral pulses without edema.  Abdomen: Soft, + BS.  Non tender, no guarding, rebound, hernias, masses. Lymphatics: Non tender without lymphadenopathy.  Musculoskeletal: Full ROM, 5/5 strength, normal gait.  Skin: Warm, dry without rashes, lesions, ecchymosis.  Neuro: Cranial nerves intact. Normal muscle tone, no cerebellar symptoms. Sensation intact.  Psych: Awake and oriented X 3, normal affect, Insight and Judgment appropriate.     Alycia Rossetti, NP 9:34 AM [facility]

## 2022-03-29 ENCOUNTER — Inpatient Hospital Stay: Admission: RE | Admit: 2022-03-29 | Payer: Medicare Other | Source: Ambulatory Visit

## 2022-03-29 ENCOUNTER — Encounter: Payer: Self-pay | Admitting: Nurse Practitioner

## 2022-03-29 ENCOUNTER — Ambulatory Visit (INDEPENDENT_AMBULATORY_CARE_PROVIDER_SITE_OTHER): Payer: Medicare Other | Admitting: Nurse Practitioner

## 2022-03-29 VITALS — BP 98/62 | HR 99 | Temp 97.7°F | Wt 133.8 lb

## 2022-03-29 DIAGNOSIS — J40 Bronchitis, not specified as acute or chronic: Secondary | ICD-10-CM

## 2022-03-29 DIAGNOSIS — F17200 Nicotine dependence, unspecified, uncomplicated: Secondary | ICD-10-CM | POA: Diagnosis not present

## 2022-03-29 DIAGNOSIS — R948 Abnormal results of function studies of other organs and systems: Secondary | ICD-10-CM

## 2022-03-29 DIAGNOSIS — Z1152 Encounter for screening for COVID-19: Secondary | ICD-10-CM

## 2022-03-29 DIAGNOSIS — J0510 Acute epiglottitis without obstruction: Secondary | ICD-10-CM

## 2022-03-29 LAB — POC COVID19 BINAXNOW: SARS Coronavirus 2 Ag: NEGATIVE

## 2022-03-29 MED ORDER — BENZONATATE 100 MG PO CAPS: 100.0000 mg | ORAL_CAPSULE | Freq: Four times a day (QID) | ORAL | 1 refills | Status: DC | PRN

## 2022-03-29 MED ORDER — ALBUTEROL SULFATE HFA 108 (90 BASE) MCG/ACT IN AERS: INHALATION_SPRAY | RESPIRATORY_TRACT | 2 refills | Status: DC

## 2022-03-29 MED ORDER — AZITHROMYCIN 250 MG PO TABS: ORAL_TABLET | ORAL | 1 refills | Status: DC

## 2022-03-29 MED ORDER — PREDNISONE 20 MG PO TABS: ORAL_TABLET | ORAL | 0 refills | Status: AC

## 2022-03-29 NOTE — Patient Instructions (Signed)
Acute Bronchitis, Adult ? ?Acute bronchitis is sudden inflammation of the main airways (bronchi) that come off the windpipe (trachea) in the lungs. The swelling causes the airways to get smaller and make more mucus than normal. This can make it hard to breathe and can cause coughing or noisy breathing (wheezing). ?Acute bronchitis may last several weeks. The cough may last longer. Allergies, asthma, and exposure to smoke may make the condition worse. ?What are the causes? ?This condition can be caused by germs and by substances that irritate the lungs, including: ?Cold and flu viruses. The most common cause of this condition is the virus that causes the common cold. ?Bacteria. This is less common. ?Breathing in substances that irritate the lungs, including: ?Smoke from cigarettes and other forms of tobacco. ?Dust and pollen. ?Fumes from household cleaning products, gases, or burned fuel. ?Indoor or outdoor air pollution. ?What increases the risk? ?The following factors may make you more likely to develop this condition: ?A weak body's defense system, also called the immune system. ?A condition that affects your lungs and breathing, such as asthma. ?What are the signs or symptoms? ?Common symptoms of this condition include: ?Coughing. This may bring up clear, yellow, or green mucus from your lungs (sputum). ?Wheezing. ?Runny or stuffy nose. ?Having too much mucus in your lungs (chest congestion). ?Shortness of breath. ?Aches and pains, including sore throat or chest. ?How is this diagnosed? ?This condition is usually diagnosed based on: ?Your symptoms and medical history. ?A physical exam. ?You may also have other tests, including tests to rule out other conditions, such as pneumonia. These tests include: ?A test of lung function. ?Test of a mucus sample to look for the presence of bacteria. ?Tests to check the oxygen level in your blood. ?Blood tests. ?Chest X-ray. ?How is this treated? ?Most cases of acute  bronchitis clear up over time without treatment. Your health care provider may recommend: ?Drinking more fluids to help thin your mucus so it is easier to cough up. ?Taking inhaled medicine (inhaler) to improve air flow in and out of your lungs. ?Using a vaporizer or a humidifier. These are machines that add water to the air to help you breathe better. ?Taking a medicine that thins mucus and clears congestion (expectorant). ?Taking a medicine that prevents or stops coughing (cough suppressant). ?It is notcommon to take an antibiotic medicine for this condition. ?Follow these instructions at home: ? ?Take over-the-counter and prescription medicines only as told by your health care provider. ?Use an inhaler, vaporizer, or humidifier as told by your health care provider. ?Take two teaspoons (10 mL) of honey at bedtime to lessen coughing at night. ?Drink enough fluid to keep your urine pale yellow. ?Do not use any products that contain nicotine or tobacco. These products include cigarettes, chewing tobacco, and vaping devices, such as e-cigarettes. If you need help quitting, ask your health care provider. ?Get plenty of rest. ?Return to your normal activities as told by your health care provider. Ask your health care provider what activities are safe for you. ?Keep all follow-up visits. This is important. ?How is this prevented? ?To lower your risk of getting this condition again: ?Wash your hands often with soap and water for at least 20 seconds. If soap and water are not available, use hand sanitizer. ?Avoid contact with people who have cold symptoms. ?Try not to touch your mouth, nose, or eyes with your hands. ?Avoid breathing in smoke or chemical fumes. Breathing smoke or chemical fumes will make your   condition worse. ?Get the flu shot every year. ?Contact a health care provider if: ?Your symptoms do not improve after 2 weeks. ?You have trouble coughing up the mucus. ?Your cough keeps you awake at night. ?You have a  fever. ?Get help right away if you: ?Cough up blood. ?Feel pain in your chest. ?Have severe shortness of breath. ?Faint or keep feeling like you are going to faint. ?Have a severe headache. ?Have a fever or chills that get worse. ?These symptoms may represent a serious problem that is an emergency. Do not wait to see if the symptoms will go away. Get medical help right away. Call your local emergency services (911 in the [place] Do not drive yourself to the hospital. ?Summary ?Acute bronchitis is inflammation of the main airways (bronchi) that come off the windpipe (trachea) in the lungs. The swelling causes the airways to get smaller and make more mucus than normal. ?Drinking more fluids can help thin your mucus so it is easier to cough up. ?Take over-the-counter and prescription medicines only as told by your health care provider. ?Do not use any products that contain nicotine or tobacco. These products include cigarettes, chewing tobacco, and vaping devices, such as e-cigarettes. If you need help quitting, ask your health care provider. ?Contact a health care provider if your symptoms do not improve after 2 weeks. ?This information is not intended to replace advice given to you by your health care provider. Make sure you discuss any questions you have with your health care provider. ?Document Revised: 01/12/2021 Document Reviewed: 01/12/2021 ?Elsevier Patient Education ? 2023 [employer] ? ?

## 2022-04-16 ENCOUNTER — Other Ambulatory Visit: Payer: Self-pay | Admitting: Adult Health

## 2022-04-16 DIAGNOSIS — I10 Essential (primary) hypertension: Secondary | ICD-10-CM

## 2022-05-04 ENCOUNTER — Institutional Professional Consult (permissible substitution): Payer: Medicare Other | Admitting: Pulmonary Disease

## 2022-05-09 ENCOUNTER — Encounter: Payer: Medicare Other | Admitting: Nurse Practitioner

## 2022-05-11 NOTE — Progress Notes (Deleted)
CPE  Assessment:   Aortic atherosclerosis(HCC) per CT 02/06/22 Control weight, blood sugar, blood pressure and cholesterol  Pulmonary Emphysema(HCC) per CT 02/06/22 Continue Albuterol PRN Quit Smoking  Essential hypertension Discussed dietary and exercise modifications -     CBC with Differential/Platelet -     COMPLETE METABOLIC PANEL WITH GFR  Extrinsic asthma without complication, unspecified asthma severity, unspecified whether persistent Has rescue inhaler Continue Breo daily  STOP SMOKING  Gastroesophageal reflux disease, esophagitis presence not specified Well managed on current medications Discussed diet, avoiding triggers and other lifestyle changes  Hypothyroidism continue medications the same pending lab results reminded to take on an empty stomach 30-29mns before food.  -     TSH  Abnormal glucose Continue diet and exercise -A1c Depression, major, recurrent, in partial remission (HCC) Taking Wellbutrin and lexapro Dr. PHarriet Phomanaging; overdue follow up; encouraged to schedule  Hyperlipidemia, mixed Continue medications Continue low cholesterol diet and exercise.  Check lipid panel.  -     Lipid panel  Vitamin D deficiency -     VITAMIN D 25 Hydroxy (Vit-D Deficiency, Fractures)  Abnormal glucose Discussed dietary and exercise modifications -     Hemoglobin A1c  Smoker Discussed smoking cessation and resources Not ready to quit at this time Will continue to assess readiness  -lung cancer screening with low dose CT discussed as recommended by guidelines based on age, number of pack year history.  Discussed risks of screening including but not limited to false positives on xray, further testing or consultation with specialist, and possible false negative CT as well. Understanding expressed and wishes to proceed with CT testing. Order placed.   Body mass index (BMI) of 23.0-23.9 in adult Discussed dietary and exercise modifications  Medication  management -     CBC with Differential/Platelet -     COMPLETE METABOLIC PANEL WITH GFR -     Magnesium  Hair loss Non-scarring, general distribution Check TSH, CBC, iron; increased stress may be contributing Suggested hair skin nails supplement  Over 40 minutes of exam, counseling, chart review and critical decision making was performed Future Appointments  Date Time Provider [place] 05/15/2022  2:00 PM WAlycia Rossetti NP [facility] None  12/20/2022  4:00 PM WAlycia Rossetti NP [facility] None  05/16/2023  2:00 PM WAlycia Rossetti NP [facility] None    Subjective:  Margaret STREHLis a 71y.o. female who presents for 3 month follow up.   Patient is on SS disability for a long hx/o severe Depression circa 1990's w/hx/o ECT x 9 in 2008. She is seeing Dr PJeneen Montgomery  She was also at one time in a long term toxic relationship with a bipolar Manic Depressive- She is now living with her daughter and not in the relationship. Prescribed lexapro 10 mg, wellbutrin 150 mg TID, PRN Xanax.   Current some day smoker, hx of asthma, had emphysematous changes noted in lung bases per CT abd 06/2020. She has 30+ pack year history on review, smoking since she was 16 currently intermittently. Has never had Ct lung cancer screening.   BMI is There is no height or weight on file to calculate BMI., she is working on diet and exercise. Has lost weight coming off the elavil. Wt Readings from Last 3 Encounters:  03/29/22 133 lb 12.8 oz (60.7 kg)  12/19/21 129 lb 9.6 oz (58.8 kg)  10/04/21 140 lb 9.6 oz (63.8 kg)   She has had elevated blood pressure since 2008. Her blood  pressure has been controlled at home, today their BP is   She does not workout. She denies chest pain, shortness of breath, dizziness.   She has aortic atherosclerosis per CT 06/2020.   She is on cholesterol medication and denies myalgias. Her cholesterol is not at goal. The cholesterol last visit was:   Lab Results   Component Value Date   CHOL 151 12/19/2021   HDL 61 12/19/2021   LDLCALC 68 12/19/2021   TRIG 139 12/19/2021   CHOLHDL 2.5 12/19/2021   She has had pre diabetes with elevated A1c of 5.7 in 2012 and highest at 5.9 in 2016. She has not been working on diet and exercise for prediabetes/abnormal glucose, and denies hyperglycemia, hypoglycemia , nausea, polydipsia and polyuria. Last A1C in the office was:  Lab Results  Component Value Date   HGBA1C 5.9 (H) 12/19/2021   Last GFR: Lab Results  Component Value Date   GFRNONAA 90 11/15/2020   Patient is on Vitamin D supplement, taking 5000 IU   Lab Results  Component Value Date   VD25OH 56 04/04/2021     She is on thyroid medication. Her medication was not changed last visit.   Lab Results  Component Value Date   TSH 1.15 12/19/2021  .    She reports taking 65 mg slow release iron recently due to hair loss concerns Lab Results  Component Value Date   IRON 108 11/15/2020   TIBC 329 11/15/2020   FERRITIN 51 11/15/2020   Lab Results  Component Value Date   WBC 6.9 12/19/2021   HGB 13.2 12/19/2021   HCT 40.1 12/19/2021   MCV 90.1 12/19/2021   PLT 359 12/19/2021    Lab Results  Component Value Date   VITAMINB12 1,512 (H) 11/15/2020     Medication Review:  Current Outpatient Medications (Endocrine & Metabolic):    levothyroxine (SYNTHROID) 75 MCG tablet, 1 TAB DAILY ON EMPTY STOMACH WITH WATER X30 MIN. NO ANTACID, MEDS, OR BIOTIN FOR 4 HOURS  Current Outpatient Medications (Cardiovascular):    atorvastatin (LIPITOR) 40 MG tablet, Take 1 tablet   every other day (even days) for Cholesterol / Patient knows to take by mouth   bumetanide (BUMEX) 2 MG tablet, TAKE ONE TABLET BY MOUTH TWICE A DAY FOR BLOOD PRESSURE AND FLUID RETENTION / ANKLE SWELLING  Current Outpatient Medications (Respiratory):    albuterol (PROAIR HFA) 108 (90 Base) MCG/ACT inhaler, Use 2 inhalations    15 minutes   every 4 hours      to rescue Asthma    benzonatate (TESSALON PERLES) 100 MG capsule, Take 1 capsule (100 mg total) by mouth every 6 (six) hours as needed for cough.   cetirizine (ZYRTEC ALLERGY) 10 MG tablet, Take 1 tablet (10 mg total) by mouth daily.   fluticasone (FLONASE) 50 MCG/ACT nasal spray, Place 2 sprays into both nostrils daily.   promethazine-dextromethorphan (PROMETHAZINE-DM) 6.25-15 MG/5ML syrup, Take 5 mLs by mouth 4 (four) times daily as needed for cough.  Current Outpatient Medications (Analgesics):    acetaminophen (TYLENOL) 500 MG tablet, Take 500 mg by mouth every 6 (six) hours as needed.   Current Outpatient Medications (Other):    amphetamine-dextroamphetamine (ADDERALL) 20 MG tablet, Takes 1 tab 2 x /day per Dr Toy Care   ARIPiprazole (ABILIFY) 2 MG tablet, Take 2 mg by mouth at bedtime.   azithromycin (ZITHROMAX) 250 MG tablet, Take 2 tablets (500 mg) on  Day 1,  followed by 1 tablet (250 mg) once daily on  Days 2 through 5.   bimatoprost (LATISSE) 0.03 % ophthalmic solution, 1 drop on applicator, apply evenly along the skin of the upper eyelid at base of eyelashes at bedtime; repeat procedure for second eye   carbamide peroxide (DEBROX) 6.5 % OTIC solution, Place 5 drops into the right ear 2 (two) times daily.   Cholecalciferol (VITAMIN D PO), Take 5,000 Units by mouth daily. 10,000   famotidine (PEPCID) 40 MG tablet, TAKE 1 TABLET BY MOUTH EVERYDAY AT BEDTIME   Flaxseed, Linseed, (FLAX SEED OIL) 1300 MG CAPS, Take 1,300 mg by mouth 3 (three) times daily.   Magnesium 500 MG TABS, Take by mouth. Takes 4 tablets daily.   Multiple Vitamin (MULTIVITAMIN) tablet, Take 1 tablet by mouth daily.   nystatin (MYCOSTATIN) 100000 UNIT/ML suspension, 5 ml four times a day, retain in mouth as long as possible (Swish and Spit).  Use for 48 hours after symptoms resolve. (Patient not taking: Reported on 03/29/2022)   Omega-3 Fatty Acids (FISH OIL) 1000 MG CAPS, Take 1 capsule (1,000 mg total) by mouth 3 (three) times daily.    pantoprazole (PROTONIX) 40 MG tablet, TAKE ONE TABLET BY MOUTH DAILY TO PREVENT INDIGESTION AND ACID REFLUX   potassium chloride SA (KLOR-CON M20) 20 MEQ tablet, TAKE ONE TABLET BY MOUTH THREE TIMES A DAY FOR POTASSIUM DEFICIENCY. NEEDS APPOINTMENT FOR REFILLS   triamcinolone cream (KENALOG) 0.5 %, Apply 1 application topically 2 (two) times daily.   valACYclovir (VALTREX) 500 MG tablet, Take 2 tablets 2 x /day at Onset of Symptoms   Vilazodone HCl (VIIBRYD) 40 MG TABS, Takes 1 tablet Daily per Dr Toy Care   Allergies  Allergen Reactions   Codeine Other (See Comments)    Abdominal pain   Dexamethasone Other (See Comments)    Sweating   Erythromycin Base     GI upset   Penicillins Rash    Current Problems (verified) Patient Active Problem List   Diagnosis Date Noted   Anemia 11/16/2020   Hair thinning 11/15/2020   Aortic atherosclerosis ([place]) by Abd CT scan 10.11.2021 07/12/2020   COPD (chronic obstructive pulmonary disease) ([place]) 07/01/2019   Abnormal glucose 05/21/2018   Attention deficit hyperactivity disorder (ADHD) 05/21/2018   Severe recurrent major depression without psychotic features ([place]) 09/17/2017    Class: Chronic   Extrinsic asthma 07/29/2015   Body mass index (BMI) of 23.0-23.9 in adult 07/29/2015   Smoker 04/14/2015   Gastroesophageal reflux disease 02/15/2015   Medication management 02/12/2014   Hypothyroidism    Essential hypertension 08/18/2013   Hyperlipidemia, mixed 08/18/2013   Vitamin D deficiency 08/18/2013   SURGICAL HISTORY She  has a past surgical history that includes Cholecystectomy (1975); Nasal sinus surgery (1996); Liposuction (1998); Breast surgery (Bilateral, 1983); Breast reconstruction (2008); Appendectomy; bil breast lumpectomy; bilateral breast masectomy (no breast cancer); and EUS (N/A, 04/23/2014). FAMILY HISTORY Her family history includes ADD / ADHD in her son; Alcohol abuse in her father and paternal uncle; Anxiety disorder in her sister;  COPD in her mother; Dementia in her mother; Depression in her mother and sister; Diabetes in her sister; Early death in her father and sister; Emphysema in her mother; Lung cancer in her mother; Schizophrenia in her sister. SOCIAL HISTORY She  reports that she has been smoking e-cigarettes and cigarettes. She started smoking about 53 years ago. She has a 39.00 pack-year smoking history. She has never used smokeless tobacco. She reports current alcohol use of about 4.0 - 5.0 standard drinks of alcohol per week.  She reports that she does not use drugs.  Review of Systems  Constitutional:  Negative for malaise/fatigue and weight loss.  HENT:  Negative for hearing loss and tinnitus.   Eyes:  Negative for blurred vision and double vision.  Respiratory:  Negative for cough, shortness of breath and wheezing.   Cardiovascular:  Negative for chest pain, palpitations, orthopnea, claudication and leg swelling.  Gastrointestinal:  Negative for abdominal pain, blood in stool, constipation, diarrhea, heartburn, melena, nausea and vomiting.  Genitourinary: Negative.   Musculoskeletal:  Negative for joint pain and myalgias.  Skin:  Negative for rash.  Neurological:  Negative for dizziness, tingling, sensory change, weakness and headaches.  Endo/Heme/Allergies:  Negative for polydipsia.  Psychiatric/Behavioral:  Positive for depression. Negative for substance abuse and suicidal ideas. The patient is nervous/anxious.   All other systems reviewed and are negative.    Objective:     There were no vitals filed for this visit.  There is no height or weight on file to calculate BMI.  General appearance: alert, no distress, WD/WN, female HEENT: left nasal passage swelling with erythema, and scabing normocephalic, sclerae anicteric, TMs pearly, nares patent, no discharge or erythema, pharynx normal Oral cavity: MMM, no lesions Neck: supple, no lymphadenopathy, no thyromegaly, no masses Heart: RRR, normal S1,  S2, no murmurs Lungs: CTA bilaterally, no wheezes, rhonchi, or rales Abdomen: +bs, soft, non tender, non distended, no masses, no hepatomegaly, no splenomegaly Musculoskeletal: nontender, no swelling, no obvious deformity Extremities: no edema, no cyanosis, no clubbing Pulses: 2+ symmetric, upper and lower extremities, normal cap refill Neurological: alert, oriented x 3, CN2-12 intact, strength normal upper extremities and lower extremities, sensation normal throughout, DTRs 2+ throughout, no cerebellar signs, gait normal Psychiatric: normal affect, behavior normal, pleasant   Skin: warm/dry/intact without rashes; hair distribution with generalized thinning, no scalp abnormalities   Alycia Rossetti, NP   05/11/2022

## 2022-05-15 ENCOUNTER — Encounter: Payer: Medicare Other | Admitting: Nurse Practitioner

## 2022-05-15 DIAGNOSIS — E559 Vitamin D deficiency, unspecified: Secondary | ICD-10-CM

## 2022-05-15 DIAGNOSIS — I700 Atherosclerosis of aorta: Secondary | ICD-10-CM

## 2022-05-15 DIAGNOSIS — F17200 Nicotine dependence, unspecified, uncomplicated: Secondary | ICD-10-CM

## 2022-05-15 DIAGNOSIS — J45909 Unspecified asthma, uncomplicated: Secondary | ICD-10-CM

## 2022-05-15 DIAGNOSIS — F3341 Major depressive disorder, recurrent, in partial remission: Secondary | ICD-10-CM

## 2022-05-15 DIAGNOSIS — K219 Gastro-esophageal reflux disease without esophagitis: Secondary | ICD-10-CM

## 2022-05-15 DIAGNOSIS — E079 Disorder of thyroid, unspecified: Secondary | ICD-10-CM

## 2022-05-15 DIAGNOSIS — J439 Emphysema, unspecified: Secondary | ICD-10-CM

## 2022-05-15 DIAGNOSIS — R7309 Other abnormal glucose: Secondary | ICD-10-CM

## 2022-05-15 DIAGNOSIS — I10 Essential (primary) hypertension: Secondary | ICD-10-CM

## 2022-05-15 DIAGNOSIS — E782 Mixed hyperlipidemia: Secondary | ICD-10-CM

## 2022-05-15 DIAGNOSIS — Z79899 Other long term (current) drug therapy: Secondary | ICD-10-CM

## 2022-06-02 ENCOUNTER — Other Ambulatory Visit: Payer: Self-pay

## 2022-06-02 MED ORDER — BIMATOPROST 0.03 % EX SOLN
CUTANEOUS | 3 refills | Status: DC
Start: 2022-06-02 — End: 2023-05-08

## 2022-06-06 ENCOUNTER — Other Ambulatory Visit: Payer: Self-pay

## 2022-06-06 DIAGNOSIS — I10 Essential (primary) hypertension: Secondary | ICD-10-CM

## 2022-06-06 MED ORDER — POTASSIUM CHLORIDE CRYS ER 20 MEQ PO TBCR: EXTENDED_RELEASE_TABLET | ORAL | Status: DC

## 2022-06-09 ENCOUNTER — Other Ambulatory Visit: Payer: Self-pay | Admitting: Internal Medicine

## 2022-06-09 DIAGNOSIS — I10 Essential (primary) hypertension: Secondary | ICD-10-CM

## 2022-06-12 DIAGNOSIS — H59033 Cystoid macular edema following cataract surgery, bilateral: Secondary | ICD-10-CM | POA: Diagnosis not present

## 2022-06-12 NOTE — Progress Notes (Unsigned)
CPE  Assessment:  Encounter for general adult medical examination with abnormal findings Due Yearly  Aortic atherosclerosis(HCC) per CT 02/06/22 Control weight, blood sugar, blood pressure and cholesterol  Pulmonary Emphysema(HCC) per CT 02/06/22 Continue Albuterol PRN Quit Smoking Follow up with Dr. Valeta Harms, missed initial appointment- given number to call and reschedule  Essential hypertension Discussed dietary and exercise modifications -     CBC with Differential/Platelet -     COMPLETE METABOLIC PANEL WITH GFR - Microalbumin/creatinine urine ratio -  Routine UA with reflex microscopic  Extrinsic asthma without complication, unspecified asthma severity, unspecified whether persistent Has rescue inhaler Continue Breo daily  STOP SMOKING Schedule appointment with Dr. Valeta Harms pulmonology  Gastroesophageal reflux disease, esophagitis presence not specified Well managed on current medications Discussed diet, avoiding triggers and other lifestyle changes  Hypothyroidism continue medications the same pending lab results reminded to take on an empty stomach 30-48mns before food.  -     TSH  Abnormal glucose Continue diet and exercise -A1c  Depression, major, recurrent, in partial remission (HCC) Taking Wellbutrin and lexapro Dr. PHarriet Phomanaging; overdue follow up; encouraged to schedule  Hyperlipidemia, mixed Continue medications Continue low cholesterol diet and exercise.  Check lipid panel.  -     Lipid panel  Vitamin D deficiency -     VITAMIN D 25 Hydroxy (Vit-D Deficiency, Fractures)  Abnormal glucose Discussed dietary and exercise modifications -     Hemoglobin A1c  Smoker Discussed smoking cessation and resources Not ready to quit at this time Will continue to assess readiness CT Chest lung cancer screen 02/06/22- suspicious. PET scan than was not abnormal, repeat CT 01/2023 Schedule appointment with Dr. IValeta Harms  Body mass index (BMI) of 23.0-23.9 in  adult Discussed dietary and exercise modifications  Medication management -     CBC with Differential/Platelet -     COMPLETE METABOLIC PANEL WITH GFR -     Magnesium    Over 40 minutes of exam, counseling, chart review and critical decision making was performed Future Appointments  Date Time [place] 12/20/2022  4:00 PM WAlycia Rossetti NP [facility] None  05/16/2023  2:00 PM WAlycia Rossetti NP [facility] None    Subjective:  Margaret MAULEis a 71y.o. female who presents for 3 month follow up.   Patient is on [employer] disability for a long hx/o severe Depression circa 1990's w/hx/o ECT x 9 in 2008. She is seeing Dr Margaret Nelson  She was also at one time in a long term toxic relationship with a bipolar Manic Depressive- She is now living with her daughter and not in the relationship. Prescribed adderall 20 mg, abilify 2 mg, and Viibyrd 40 mg daily.  Mood is not currently controlled with current regimen. She needs to call for sooner appointment as next appointment is not until December. She is making dream catchers and selling them.   Currently smoking 1/2 ppd, hx of asthma, had emphysematous changes noted in lung bases per CT abd 06/2020. She has 30+ pack year history on review, smoking since she was 89 currently intermittently. CT Chest lung cancer screen 02/06/22- suspicious. PET scan than was not abnormal, repeat CT 01/2023. She was to follow up with Dr. IValeta Harmspulmonology but missed appointment, wants to reschedule.  She has been having symptoms of sinus congestion and productive cough of green mucus She has also noted pain in her left ear.   She had bilateral catarracts removed 01/2022. She had to go back to  eye doctor yesterday because she was having visual difficulties.  She has fluid in the eyes after surgery and is to start drops- Dr Margaret Nelson in [place], continues to follow.   She had bilateral mastectomy 1982  BMI is Body mass index is 24.41 kg/m., she is working on diet  and exercise.  Wt Readings from Last 3 Encounters:  06/13/22 137 lb 12.8 oz (62.5 kg)  03/29/22 133 lb 12.8 oz (60.7 kg)  12/19/21 129 lb 9.6 oz (58.8 kg)   She has had elevated blood pressure since 2008. Her blood pressure has been controlled at home, today their BP is BP: 100/62  BP Readings from Last 3 Encounters:  06/13/22 100/62  03/29/22 98/62  12/19/21 102/62  She does not workout. She denies chest pain, shortness of breath, dizziness.    She has aortic atherosclerosis per CT 06/2020.   She is on cholesterol medication, atorvastatin 40 mg,  and denies myalgias. Her cholesterol is not at goal. The cholesterol last visit was:   Lab Results  Component Value Date   CHOL 151 12/19/2021   HDL 61 12/19/2021   LDLCALC 68 12/19/2021   TRIG 139 12/19/2021   CHOLHDL 2.5 12/19/2021   She has had pre diabetes with elevated A1c of 5.7 in 2012 and highest at 5.9 in 2016. She has not been working on diet and exercise for prediabetes/abnormal glucose, and denies hyperglycemia, hypoglycemia , nausea, polydipsia and polyuria. Last A1C in the office was:  Lab Results  Component Value Date   HGBA1C 5.9 (H) 12/19/2021   Last GFR: Lab Results  Component Value Date   EGFR 86 12/19/2021    Patient is on Vitamin D supplement, taking 5000 IU   Lab Results  Component Value Date   VD25OH 42 04/04/2021     She is on thyroid medication, levothyroxine 75 mcg QD. Her medication was not changed last visit.   Lab Results  Component Value Date   TSH 1.15 12/19/2021    Lab Results  Component Value Date   VITAMINB12 1,512 (H) 11/15/2020     Medication Review:  Current Outpatient Medications (Endocrine & Metabolic):    levothyroxine (SYNTHROID) 75 MCG tablet, 1 TAB DAILY ON EMPTY STOMACH WITH WATER X30 MIN. NO ANTACID, MEDS, OR BIOTIN FOR 4 HOURS  Current Outpatient Medications (Cardiovascular):    atorvastatin (LIPITOR) 40 MG tablet, Take 1 tablet   every other day (even days) for  Cholesterol / Patient knows to take by mouth   bumetanide (BUMEX) 2 MG tablet, TAKE ONE TABLET BY MOUTH TWICE A DAY FOR BLOOD PRESSURE AND FLUID RETENTION / ANKLE SWELLING  Current Outpatient Medications (Respiratory):    albuterol (PROAIR HFA) 108 (90 Base) MCG/ACT inhaler, Use 2 inhalations    15 minutes   every 4 hours      to rescue Asthma   cetirizine (ZYRTEC ALLERGY) 10 MG tablet, Take 1 tablet (10 mg total) by mouth daily.   fluticasone (FLONASE) 50 MCG/ACT nasal spray, Place 2 sprays into both nostrils daily.  Current Outpatient Medications (Analgesics):    acetaminophen (TYLENOL) 500 MG tablet, Take 500 mg by mouth every 6 (six) hours as needed.   Current Outpatient Medications (Other):    amphetamine-dextroamphetamine (ADDERALL) 20 MG tablet, Takes 1 tab 2 x /day per Dr Toy Nelson   ARIPiprazole (ABILIFY) 2 MG tablet, Take 2 mg by mouth at bedtime.   bimatoprost (LATISSE) 0.03 % ophthalmic solution, 1 drop on applicator, apply evenly along the skin of the upper  eyelid at base of eyelashes at bedtime; repeat procedure for second eye   carbamide peroxide (DEBROX) 6.5 % OTIC solution, Place 5 drops into the right ear 2 (two) times daily.   Cholecalciferol (VITAMIN D PO), Take 5,000 Units by mouth daily. 10,000   famotidine (PEPCID) 40 MG tablet, TAKE 1 TABLET BY MOUTH EVERYDAY AT BEDTIME   Flaxseed, Linseed, (FLAX SEED OIL) 1300 MG CAPS, Take 1,300 mg by mouth 3 (three) times daily.   Magnesium 500 MG TABS, Take by mouth. Takes 4 tablets daily.   Multiple Vitamin (MULTIVITAMIN) tablet, Take 1 tablet by mouth daily.   Omega-3 Fatty Acids (FISH OIL) 1000 MG CAPS, Take 1 capsule (1,000 mg total) by mouth 3 (three) times daily.   pantoprazole (PROTONIX) 40 MG tablet, TAKE ONE TABLET BY MOUTH DAILY TO PREVENT INDIGESTION AND ACID REFLUX   potassium chloride SA (KLOR-CON M20) 20 MEQ tablet, TAKE ONE TABLET BY MOUTH THREE TIMES A DAY FOR POTASSIUM DEFICIENCY. NEEDS APPOINTMENT FOR REFILLS    triamcinolone cream (KENALOG) 0.5 %, Apply 1 application topically 2 (two) times daily.   valACYclovir (VALTREX) 500 MG tablet, Take 2 tablets 2 x /day at Onset of Symptoms   Vilazodone HCl (VIIBRYD) 40 MG TABS, Takes 1 tablet Daily per Dr Toy Nelson   Allergies  Allergen Reactions   Codeine Other (See Comments)    Abdominal pain   Dexamethasone Other (See Comments)    Sweating   Erythromycin Base     GI upset   Penicillins Rash    Current Problems (verified) Patient Active Problem List   Diagnosis Date Noted   Anemia 11/16/2020   Hair thinning 11/15/2020   Aortic atherosclerosis ([place]) by Abd CT scan 10.11.2021 07/12/2020   COPD (chronic obstructive pulmonary disease) ([place]) 07/01/2019   Abnormal glucose 05/21/2018   Attention deficit hyperactivity disorder (ADHD) 05/21/2018   Severe recurrent major depression without psychotic features ([place]) 09/17/2017    Class: Chronic   Extrinsic asthma 07/29/2015   Body mass index (BMI) of 23.0-23.9 in adult 07/29/2015   Smoker 04/14/2015   Gastroesophageal reflux disease 02/15/2015   Medication management 02/12/2014   Hypothyroidism    Essential hypertension 08/18/2013   Hyperlipidemia, mixed 08/18/2013   Vitamin D deficiency 08/18/2013   SURGICAL HISTORY She  has a past surgical history that includes Cholecystectomy (1975); Nasal sinus surgery (1996); Liposuction (1998); Breast surgery (Bilateral, 1983); Breast reconstruction (2008); Appendectomy; bil breast lumpectomy; bilateral breast masectomy (no breast cancer); and EUS (N/A, 04/23/2014). FAMILY HISTORY Her family history includes ADD / ADHD in her son; Alcohol abuse in her father and paternal uncle; Anxiety disorder in her sister; COPD in her mother; Dementia in her mother; Depression in her mother and sister; Diabetes in her sister; Early death in her father and sister; Emphysema in her mother; Lung cancer in her mother; Schizophrenia in her sister. SOCIAL HISTORY She  reports that she  has been smoking e-cigarettes and cigarettes. She started smoking about 53 years ago. She has a 39.00 pack-year smoking history. She has never used smokeless tobacco. She reports current alcohol use of about 4.0 - 5.0 standard drinks of alcohol per week. She reports that she does not use drugs.  Review of Systems  Constitutional:  Negative for malaise/fatigue and weight loss.  HENT:  Negative for hearing loss and tinnitus.   Eyes:  Negative for blurred vision and double vision.  Respiratory:  Negative for cough, shortness of breath and wheezing.   Cardiovascular:  Negative for chest pain, palpitations,  orthopnea, claudication and leg swelling.  Gastrointestinal:  Negative for abdominal pain, blood in stool, constipation, diarrhea, heartburn, melena, nausea and vomiting.  Genitourinary: Negative.   Musculoskeletal:  Negative for joint pain and myalgias.  Skin:  Negative for rash.  Neurological:  Negative for dizziness, tingling, sensory change, weakness and headaches.  Endo/Heme/Allergies:  Negative for polydipsia.  Psychiatric/Behavioral:  Positive for depression. Negative for substance abuse and suicidal ideas. The patient is nervous/anxious.   All other systems reviewed and are negative.    Objective:     Today's Vitals   06/13/22 1404  BP: 100/62  Pulse: 97  Temp: 97.7 F (36.5 C)  SpO2: 94%  Weight: 137 lb 12.8 oz (62.5 kg)  Height: '5\' 3"'  (1.6 m)    Body mass index is 24.41 kg/m.  General appearance: alert, no distress, WD/WN, female HEENT: left nasal passage swelling with erythema, and scabing normocephalic, sclerae anicteric, TMs pearly, nares patent, no discharge or erythema, pharynx normal Oral cavity: MMM, no lesions Neck: supple, no lymphadenopathy, no thyromegaly, no masses Heart: RRR, normal S1, S2, no murmurs Lungs: CTA bilaterally, no wheezes, rhonchi, or rales Abdomen: +bs, soft, non tender, non distended, no masses, no hepatomegaly, no  splenomegaly Musculoskeletal: nontender, no swelling, no obvious deformity Extremities: no edema, no cyanosis, no clubbing Pulses: 2+ symmetric, upper and lower extremities, normal cap refill Neurological: alert, oriented x 3, CN2-12 intact, strength normal upper extremities and lower extremities, sensation normal throughout, DTRs 2+ throughout, no cerebellar signs, gait normal Psychiatric: normal affect, behavior normal, pleasant   Skin: warm/dry/intact without rashes EKG: NSR, no ST changes  Alycia Rossetti, NP   06/13/2022

## 2022-06-13 ENCOUNTER — Ambulatory Visit (INDEPENDENT_AMBULATORY_CARE_PROVIDER_SITE_OTHER): Payer: Medicare Other | Admitting: Nurse Practitioner

## 2022-06-13 ENCOUNTER — Encounter: Payer: Self-pay | Admitting: Nurse Practitioner

## 2022-06-13 VITALS — BP 100/62 | HR 97 | Temp 97.7°F | Ht 63.0 in | Wt 137.8 lb

## 2022-06-13 DIAGNOSIS — I10 Essential (primary) hypertension: Secondary | ICD-10-CM | POA: Diagnosis not present

## 2022-06-13 DIAGNOSIS — E782 Mixed hyperlipidemia: Secondary | ICD-10-CM | POA: Diagnosis not present

## 2022-06-13 DIAGNOSIS — J0100 Acute maxillary sinusitis, unspecified: Secondary | ICD-10-CM

## 2022-06-13 DIAGNOSIS — R7309 Other abnormal glucose: Secondary | ICD-10-CM

## 2022-06-13 DIAGNOSIS — J439 Emphysema, unspecified: Secondary | ICD-10-CM

## 2022-06-13 DIAGNOSIS — Z6823 Body mass index (BMI) 23.0-23.9, adult: Secondary | ICD-10-CM

## 2022-06-13 DIAGNOSIS — Z136 Encounter for screening for cardiovascular disorders: Secondary | ICD-10-CM

## 2022-06-13 DIAGNOSIS — J45909 Unspecified asthma, uncomplicated: Secondary | ICD-10-CM | POA: Diagnosis not present

## 2022-06-13 DIAGNOSIS — F17200 Nicotine dependence, unspecified, uncomplicated: Secondary | ICD-10-CM

## 2022-06-13 DIAGNOSIS — F3341 Major depressive disorder, recurrent, in partial remission: Secondary | ICD-10-CM

## 2022-06-13 DIAGNOSIS — E559 Vitamin D deficiency, unspecified: Secondary | ICD-10-CM

## 2022-06-13 DIAGNOSIS — I700 Atherosclerosis of aorta: Secondary | ICD-10-CM

## 2022-06-13 DIAGNOSIS — Z79899 Other long term (current) drug therapy: Secondary | ICD-10-CM

## 2022-06-13 DIAGNOSIS — Z0001 Encounter for general adult medical examination with abnormal findings: Secondary | ICD-10-CM

## 2022-06-13 DIAGNOSIS — K2100 Gastro-esophageal reflux disease with esophagitis, without bleeding: Secondary | ICD-10-CM

## 2022-06-13 DIAGNOSIS — E079 Disorder of thyroid, unspecified: Secondary | ICD-10-CM

## 2022-06-13 MED ORDER — AZITHROMYCIN 250 MG PO TABS: ORAL_TABLET | ORAL | 1 refills | Status: DC

## 2022-06-13 MED ORDER — PREDNISONE 20 MG PO TABS: ORAL_TABLET | ORAL | 0 refills | Status: AC

## 2022-06-13 NOTE — Patient Instructions (Signed)

## 2022-06-14 ENCOUNTER — Other Ambulatory Visit: Payer: Self-pay | Admitting: Nurse Practitioner

## 2022-06-14 DIAGNOSIS — R8290 Unspecified abnormal findings in urine: Secondary | ICD-10-CM

## 2022-06-14 LAB — CBC WITH DIFFERENTIAL/PLATELET
Absolute Monocytes: 465 cells/uL (ref 200–950)
Basophils Absolute: 89 cells/uL (ref 0–200)
Basophils Relative: 0.9 %
Eosinophils Absolute: 366 cells/uL (ref 15–500)
Eosinophils Relative: 3.7 %
HCT: 38.7 % (ref 35.0–45.0)
Hemoglobin: 13.3 g/dL (ref 11.7–15.5)
Lymphs Abs: 3584 cells/uL (ref 850–3900)
MCH: 31.7 pg (ref 27.0–33.0)
MCHC: 34.4 g/dL (ref 32.0–36.0)
MCV: 92.1 fL (ref 80.0–100.0)
MPV: 10.9 fL (ref 7.5–12.5)
Monocytes Relative: 4.7 %
Neutro Abs: 5396 cells/uL (ref 1500–7800)
Neutrophils Relative %: 54.5 %
Platelets: 287 10*3/uL (ref 140–400)
RBC: 4.2 10*6/uL (ref 3.80–5.10)
RDW: 13.6 % (ref 11.0–15.0)
Total Lymphocyte: 36.2 %
WBC: 9.9 10*3/uL (ref 3.8–10.8)

## 2022-06-14 LAB — MICROALBUMIN / CREATININE URINE RATIO
Creatinine, Urine: 81 mg/dL (ref 20–275)
Microalb, Ur: <0.2 mg/dL

## 2022-06-14 LAB — URINALYSIS, ROUTINE W REFLEX MICROSCOPIC
Bilirubin Urine: NEGATIVE
Glucose, UA: NEGATIVE
Hgb urine dipstick: NEGATIVE
Hyaline Cast: NONE SEEN /LPF
Ketones, ur: NEGATIVE
Nitrite: NEGATIVE
Protein, ur: NEGATIVE
RBC / HPF: NONE SEEN /HPF (ref 0–2)
Specific Gravity, Urine: 1.014 (ref 1.001–1.035)
pH: 7 (ref 5.0–8.0)

## 2022-06-14 LAB — COMPLETE METABOLIC PANEL WITH GFR
AG Ratio: 1.9 (calc) (ref 1.0–2.5)
ALT: 19 U/L (ref 6–29)
AST: 18 U/L (ref 10–35)
Albumin: 4.2 g/dL (ref 3.6–5.1)
Alkaline phosphatase (APISO): 91 U/L (ref 37–153)
BUN: 15 mg/dL (ref 7–25)
CO2: 31 mmol/L (ref 20–32)
Calcium: 9.4 mg/dL (ref 8.6–10.4)
Chloride: 102 mmol/L (ref 98–110)
Creat: 0.76 mg/dL (ref 0.50–1.05)
Globulin: 2.2 g/dL (calc) (ref 1.9–3.7)
Glucose, Bld: 94 mg/dL (ref 65–99)
Potassium: 4.4 mmol/L (ref 3.5–5.3)
Sodium: 142 mmol/L (ref 135–146)
Total Bilirubin: 0.4 mg/dL (ref 0.2–1.2)
Total Protein: 6.4 g/dL (ref 6.1–8.1)
eGFR: 85 mL/min/{1.73_m2} (ref >=60)

## 2022-06-14 LAB — TSH: TSH: 0.56 mIU/L (ref 0.40–4.50)

## 2022-06-14 LAB — MICROSCOPIC MESSAGE

## 2022-06-14 LAB — LIPID PANEL
Cholesterol: 191 mg/dL (ref <200)
HDL: 59 mg/dL (ref >=50)
LDL Cholesterol (Calc): 107 mg/dL (calc) — ABNORMAL HIGH
Non-HDL Cholesterol (Calc): 132 mg/dL (calc) — ABNORMAL HIGH (ref <130)
Total CHOL/HDL Ratio: 3.2 (calc) (ref <5.0)
Triglycerides: 139 mg/dL (ref <150)

## 2022-06-14 LAB — MAGNESIUM: Magnesium: 2.1 mg/dL (ref 1.5–2.5)

## 2022-06-14 LAB — VITAMIN D 25 HYDROXY (VIT D DEFICIENCY, FRACTURES): Vit D, 25-Hydroxy: 59 ng/mL (ref 30–100)

## 2022-06-14 LAB — HEMOGLOBIN A1C
Hgb A1c MFr Bld: 5.4 % of total Hgb (ref <5.7)
Mean Plasma Glucose: 108 mg/dL
eAG (mmol/L): 6 mmol/L

## 2022-06-15 LAB — URINE CULTURE
MICRO NUMBER:: 13945173
SPECIMEN QUALITY:: ADEQUATE

## 2022-06-16 ENCOUNTER — Other Ambulatory Visit: Payer: Self-pay | Admitting: Internal Medicine

## 2022-06-21 ENCOUNTER — Other Ambulatory Visit: Payer: Self-pay | Admitting: Internal Medicine

## 2022-06-21 DIAGNOSIS — I10 Essential (primary) hypertension: Secondary | ICD-10-CM

## 2022-06-21 DIAGNOSIS — R609 Edema, unspecified: Secondary | ICD-10-CM

## 2022-06-21 MED ORDER — BUMETANIDE 2 MG PO TABS: ORAL_TABLET | ORAL | 3 refills | Status: DC

## 2022-07-03 ENCOUNTER — Telehealth: Payer: Self-pay | Admitting: Nurse Practitioner

## 2022-07-03 ENCOUNTER — Other Ambulatory Visit: Payer: Self-pay | Admitting: Nurse Practitioner

## 2022-07-03 DIAGNOSIS — I10 Essential (primary) hypertension: Secondary | ICD-10-CM

## 2022-07-03 MED ORDER — POTASSIUM CHLORIDE CRYS ER 20 MEQ PO TBCR: EXTENDED_RELEASE_TABLET | ORAL | Status: DC

## 2022-07-03 NOTE — Telephone Encounter (Signed)
Patient is requesting a refill on Potassium to [facility] in [facility].

## 2022-07-04 ENCOUNTER — Other Ambulatory Visit: Payer: Self-pay

## 2022-07-04 DIAGNOSIS — I10 Essential (primary) hypertension: Secondary | ICD-10-CM

## 2022-07-04 MED ORDER — POTASSIUM CHLORIDE CRYS ER 20 MEQ PO TBCR: EXTENDED_RELEASE_TABLET | ORAL | Status: DC

## 2022-07-10 ENCOUNTER — Other Ambulatory Visit: Payer: Self-pay

## 2022-07-10 DIAGNOSIS — I10 Essential (primary) hypertension: Secondary | ICD-10-CM

## 2022-07-10 MED ORDER — POTASSIUM CHLORIDE CRYS ER 20 MEQ PO TBCR: EXTENDED_RELEASE_TABLET | ORAL | Status: DC

## 2022-07-12 ENCOUNTER — Other Ambulatory Visit: Payer: Self-pay | Admitting: Internal Medicine

## 2022-07-12 ENCOUNTER — Other Ambulatory Visit: Payer: Self-pay

## 2022-07-12 DIAGNOSIS — I10 Essential (primary) hypertension: Secondary | ICD-10-CM

## 2022-07-12 MED ORDER — POTASSIUM CHLORIDE CRYS ER 20 MEQ PO TBCR: EXTENDED_RELEASE_TABLET | ORAL | Status: DC

## 2022-07-17 DIAGNOSIS — H59033 Cystoid macular edema following cataract surgery, bilateral: Secondary | ICD-10-CM | POA: Diagnosis not present

## 2022-07-19 ENCOUNTER — Other Ambulatory Visit: Payer: Self-pay | Admitting: Nurse Practitioner

## 2022-07-19 DIAGNOSIS — I10 Essential (primary) hypertension: Secondary | ICD-10-CM

## 2022-07-19 MED ORDER — POTASSIUM CHLORIDE CRYS ER 20 MEQ PO TBCR: EXTENDED_RELEASE_TABLET | ORAL | 0 refills | Status: DC

## 2022-08-09 ENCOUNTER — Other Ambulatory Visit: Payer: Self-pay | Admitting: Nurse Practitioner

## 2022-08-09 DIAGNOSIS — I10 Essential (primary) hypertension: Secondary | ICD-10-CM

## 2022-08-15 DIAGNOSIS — H59033 Cystoid macular edema following cataract surgery, bilateral: Secondary | ICD-10-CM | POA: Diagnosis not present

## 2022-08-15 DIAGNOSIS — Z23 Encounter for immunization: Secondary | ICD-10-CM | POA: Diagnosis not present

## 2022-09-11 ENCOUNTER — Telehealth: Payer: Self-pay | Admitting: Nurse Practitioner

## 2022-09-11 ENCOUNTER — Other Ambulatory Visit: Payer: Self-pay | Admitting: Nurse Practitioner

## 2022-09-11 DIAGNOSIS — E079 Disorder of thyroid, unspecified: Secondary | ICD-10-CM

## 2022-09-11 MED ORDER — LEVOTHYROXINE SODIUM 75 MCG PO TABS: ORAL_TABLET | ORAL | 1 refills | Status: DC

## 2022-09-11 MED ORDER — ATORVASTATIN CALCIUM 40 MG PO TABS: ORAL_TABLET | ORAL | 3 refills | Status: DC

## 2022-09-11 NOTE — Telephone Encounter (Signed)
I sent in the refills to pharmacy in [place]

## 2022-09-11 NOTE — Telephone Encounter (Signed)
Patient is out of town and wasn't due for refills before she left. Will you send in refills for Levothyroxine and Atorvastatin to [facility] in [place], [place]?   [address] [place] [address] Store # [phone]

## 2022-09-19 DIAGNOSIS — H59033 Cystoid macular edema following cataract surgery, bilateral: Secondary | ICD-10-CM | POA: Diagnosis not present

## 2022-12-20 ENCOUNTER — Ambulatory Visit: Payer: Medicare Other | Admitting: Nurse Practitioner

## 2023-01-01 ENCOUNTER — Other Ambulatory Visit: Payer: Self-pay

## 2023-01-01 MED ORDER — PANTOPRAZOLE SODIUM 40 MG PO TBEC: DELAYED_RELEASE_TABLET | ORAL | 1 refills | Status: DC

## 2023-01-03 ENCOUNTER — Ambulatory Visit: Payer: Medicare Other | Admitting: Nurse Practitioner

## 2023-01-08 NOTE — Progress Notes (Deleted)
MEDICARE ANNUAL WELLNESS VISIT AND 3 MONTH FOLLOW UP  Assessment:   Encounter for Medicare Annual Wellness visit Due Yearly Declines Mammogram, DEXA and colonoscopy  Essential hypertension Discussed dietary and exercise modifications -     CBC with Differential/Platelet -     COMPLETE METABOLIC PANEL WITH GFR  Aortic atherosclerosis(HCC) Control BP, weight, cholesterol and blood sugars.   Extrinsic asthma without complication, unspecified asthma severity, unspecified whether persistent Has rescue inhaler Continue Breo daily  STOP SMOKING  Gastroesophageal reflux disease, esophagitis presence not specified Well managed on current medications Discussed diet, avoiding triggers and other lifestyle changes  Hypothyroidism continue medications the same pending lab results reminded to take on an empty stomach 30-44mins before food.  -     TSH  Depression, major, recurrent, in partial remission (HCC) Taking Wellbutrin and lexapro Dr. Evelene Croon managing; overdue follow up; encouraged to schedule  Hyperlipidemia, mixed Continue medications Continue low cholesterol diet and exercise.  Check lipid panel.  -     Lipid panel  Vitamin D deficiency Continue Vit D supplementation to maintain value in therapeutic level of 60-100   Abnormal glucose Discussed dietary and exercise modifications -     Hemoglobin A1c  Smoker/Emphysema Discussed smoking cessation and resources She does not smoke everyday- only 5 cigarettes a week.  -lung cancer screening with low dose CT discussed as recommended by guidelines based on age, number of pack year history.  Discussed risks of screening including but not limited to false positives on xray, further testing or consultation with specialist, and possible false negative CT as well. Understanding expressed and wishes to proceed with CT testing. Order placed.   Body mass index (BMI) of 22 in adult Discussed dietary and exercise  modifications  Medication management -     CBC with Differential/Platelet -     COMPLETE METABOLIC PANEL WITH GFR     Over 40 minutes of exam, counseling, chart review and critical decision making was performed Future Appointments  Date Time Provider Department Center  01/09/2023  3:00 PM [name], NP [facility] None  05/16/2023  2:00 PM [name], NP [facility] None  01/09/2024  3:00 PM [name], NP [facility] None   Plan:   During the course of the visit the patient was educated and counseled about appropriate screening and preventive services including:   Pneumococcal vaccine  Prevnar 13 Influenza vaccine Td vaccine Screening electrocardiogram Bone densitometry screening Colorectal cancer screening Diabetes screening Glaucoma screening Nutrition counseling  Advanced directives: requested   Subjective:  Margaret Nelson is a 72 y.o. female who presents for 3 month follow up.   Patient is on [employer] disability for a long hx/o severe Depression circa 1990's w/hx/o ECT x 9 in 2008. She is seeing Dr Mauri Reading.  She was also at one time in a long term toxic relationship with a bipolar Manic Depressive- She is now living with her daughter and not in the relationship. Prescribed lexapro 10 mg, wellbutrin 150 mg TID, PRN Xanax.   Current some day smoker, hx of asthma, had emphysematous changes noted in lung bases per CT abd 06/2020. She has 30+ pack year history on review, smoking since she was 18, currently intermittently. Has never had Ct lung cancer screening, willing to have low dose CT.   Still having coughing but has improved, believes it can be related to allergies.   BMI is There is no height or weight on file to calculate BMI., she is working on diet and  exercise. She is eating better, will monitor weight. Wt Readings from Last 3 Encounters:  06/13/22 137 lb 12.8 oz (62.5 kg)  03/29/22 133 lb 12.8 oz (60.7 kg)  12/19/21 129 lb 9.6 oz (58.8 kg)   She  has had elevated blood pressure since 2008. Her blood pressure has been controlled at home, today their BP is    BP Readings from Last 3 Encounters:  06/13/22 100/62  03/29/22 98/62  12/19/21 102/62    She does not workout. She denies chest pain, shortness of breath, dizziness.   She has aortic atherosclerosis per CT 06/2020.   She is on cholesterol medication and denies myalgias. Her cholesterol is not at goal. The cholesterol last visit was:   Lab Results  Component Value Date   CHOL 191 06/13/2022   HDL 59 06/13/2022   LDLCALC 107 (H) 06/13/2022   TRIG 139 06/13/2022   CHOLHDL 3.2 06/13/2022   She has had pre diabetes with elevated A1c of 5.7 in 2012 and highest at 5.9 in 2016. She has not been working on diet and exercise for prediabetes/abnormal glucose, and denies hyperglycemia, hypoglycemia , nausea, polydipsia and polyuria. Last A1C in the office was:  Lab Results  Component Value Date   HGBA1C 5.4 06/13/2022   Last GFR: Lab Results  Component Value Date   GFRNONAA 90 11/15/2020   Patient is on Vitamin D supplement, taking 5000 IU   Lab Results  Component Value Date   VD25OH 37 06/13/2022     She is on thyroid medication. Her medication was not changed last visit.   Lab Results  Component Value Date   TSH 0.56 06/13/2022  .    She reports taking 65 mg slow release iron recently due to hair loss concerns Lab Results  Component Value Date   IRON 108 11/15/2020   TIBC 329 11/15/2020   FERRITIN 51 11/15/2020   Lab Results  Component Value Date   WBC 9.9 06/13/2022   HGB 13.3 06/13/2022   HCT 38.7 06/13/2022   MCV 92.1 06/13/2022   PLT 287 06/13/2022    Lab Results  Component Value Date   VITAMINB12 1,512 (H) 11/15/2020     Medication Review:  Current Outpatient Medications (Endocrine & Metabolic):    levothyroxine (SYNTHROID) 75 MCG tablet, 1 TAB DAILY ON EMPTY STOMACH WITH WATER X30 MIN. NO ANTACID, MEDS, OR BIOTIN FOR 4 HOURS  Current  Outpatient Medications (Cardiovascular):    atorvastatin (LIPITOR) 40 MG tablet, Take 1 tablet   every other day (even days) for Cholesterol / Patient knows to take by mouth   bumetanide (BUMEX) 2 MG tablet, Take  1 tablet  2 x /day  for BP & Fluid Retention                                          /                               TAKE                                            BY  MOUTH  Current Outpatient Medications (Respiratory):    albuterol (PROAIR HFA) 108 (90 Base) MCG/ACT inhaler, Use 2 inhalations    15 minutes   every 4 hours      to rescue Asthma   cetirizine (ZYRTEC ALLERGY) 10 MG tablet, Take 1 tablet (10 mg total) by mouth daily.   fluticasone (FLONASE) 50 MCG/ACT nasal spray, Place 2 sprays into both nostrils daily.  Current Outpatient Medications (Analgesics):    acetaminophen (TYLENOL) 500 MG tablet, Take 500 mg by mouth every 6 (six) hours as needed.   Current Outpatient Medications (Other):    amphetamine-dextroamphetamine (ADDERALL) 20 MG tablet, Takes 1 tab 2 x /day per Dr Evelene Croon   ARIPiprazole (ABILIFY) 2 MG tablet, Take 2 mg by mouth at bedtime.   bimatoprost (LATISSE) 0.03 % ophthalmic solution, 1 drop on applicator, apply evenly along the skin of the upper eyelid at base of eyelashes at bedtime; repeat procedure for second eye   Cholecalciferol (VITAMIN D PO), Take 5,000 Units by mouth daily. 10,000   famotidine (PEPCID) 40 MG tablet, TAKE 1 TABLET BY MOUTH EVERYDAY AT BEDTIME   Flaxseed, Linseed, (FLAX SEED OIL) 1300 MG CAPS, Take 1,300 mg by mouth 3 (three) times daily.   Magnesium 500 MG TABS, Take by mouth. Takes 4 tablets daily.   Multiple Vitamin (MULTIVITAMIN) tablet, Take 1 tablet by mouth daily.   Omega-3 Fatty Acids (FISH OIL) 1000 MG CAPS, Take 1 capsule (1,000 mg total) by mouth 3 (three) times daily.   pantoprazole (PROTONIX) 40 MG tablet, TAKE ONE TABLET BY MOUTH DAILY TO PREVENT INDIGESTION AND ACID REFLUX   potassium  chloride SA (KLOR-CON M20) 20 MEQ tablet, Take  1 tablet  3 x /day  for Potassium deficiency                               /                        TAKE                                        BY                           MOUTH   triamcinolone cream (KENALOG) 0.5 %, Apply 1 application topically 2 (two) times daily.   valACYclovir (VALTREX) 500 MG tablet, Take 2 tablets 2 x /day at Onset of Symptoms   Vilazodone HCl (VIIBRYD) 40 MG TABS, Takes 1 tablet Daily per Dr Evelene Croon   Allergies  Allergen Reactions   Codeine Other (See Comments)    Abdominal pain   Dexamethasone Other (See Comments)    Sweating   Erythromycin Base     GI upset   Penicillins Rash    Current Problems (verified) Patient Active Problem List   Diagnosis Date Noted   Anemia 11/16/2020   Hair thinning 11/15/2020   Aortic atherosclerosis (HCC) by Abd CT scan 10.11.2021 07/12/2020   COPD (chronic obstructive pulmonary disease) 07/01/2019   Abnormal glucose 05/21/2018   Attention deficit hyperactivity disorder (ADHD) 05/21/2018   Severe recurrent major depression without psychotic features 09/17/2017    Class: Chronic   Extrinsic asthma 07/29/2015   Body mass index (BMI) of 23.0-23.9 in adult 07/29/2015   Smoker 04/14/2015  Gastroesophageal reflux disease 02/15/2015   Medication management 02/12/2014   Hypothyroidism    Essential hypertension 08/18/2013   Hyperlipidemia, mixed 08/18/2013   Vitamin D deficiency 08/18/2013   SURGICAL HISTORY She  has a past surgical history that includes Cholecystectomy (1975); Nasal sinus surgery (1996); Liposuction (1998); Breast surgery (Bilateral, 1983); Breast reconstruction (2008); Appendectomy; bil breast lumpectomy; bilateral breast masectomy (no breast cancer); and EUS (N/A, 04/23/2014). FAMILY HISTORY Her family history includes ADD / ADHD in her son; Alcohol abuse in her father and paternal uncle; Anxiety disorder in her sister; COPD in her mother; Dementia in her mother;  Depression in her mother and sister; Diabetes in her sister; Early death in her father and sister; Emphysema in her mother; Lung cancer in her mother; Schizophrenia in her sister. SOCIAL HISTORY She  reports that she has been smoking e-cigarettes and cigarettes. She started smoking about 54 years ago. She has a 39.00 pack-year smoking history. She has never used smokeless tobacco. She reports current alcohol use of about 4.0 - 5.0 standard drinks of alcohol per week. She reports that she does not use drugs.  Immunization History  Administered Date(s) Administered   Influenza Split 06/22/2015   Influenza, High Dose Seasonal PF 07/16/2018, 07/02/2019, 07/28/2020, 08/10/2021   Influenza,inj,Quad PF,6+ Mos 07/22/2017   Influenza,inj,quad, With Preservative 07/05/2016   Influenza-Unspecified 07/22/2017   Moderna Covid-19 Vaccine Bivalent Booster 5yrs & up 07/12/2021   PFIZER(Purple Top)SARS-COV-2 Vaccination 11/01/2019, 11/22/2019, 10/04/2020   Pneumococcal Conjugate-13 12/03/2018   Pneumococcal-Unspecified 09/25/2004   Td 09/25/2004   Tdap 12/01/2015    Health Maintenance  Topic Date Due   Hepatitis C Screening  Never done   Zoster Vaccines- Shingrix (1 of 2) Never done   MAMMOGRAM  03/31/2012   COLONOSCOPY (Pts 45-65yrs Insurance coverage will need to be confirmed)  04/13/2017   DEXA SCAN  Never done   Pneumonia Vaccine 69+ Years old (2 of 2 - PPSV23 or PCV20) 01/28/2019   COVID-19 Vaccine (5 - 2023-24 season) 05/26/2022   Medicare Annual Wellness (AWV)  12/20/2022   Lung Cancer Screening  02/07/2023   INFLUENZA VACCINE  04/26/2023   DTaP/Tdap/Td (3 - Td or Tdap) 11/30/2025   HPV VACCINES  Aged Out     Eye Doctor : [facility] 11/2021  Dentist: [facility]; 08/2021  MEDICARE WELLNESS OBJECTIVES: Physical activity:   Cardiac risk factors:   Depression/mood screen:      12/19/2021    4:37 PM  Depression screen PHQ 2/9  Decreased Interest 0  Down, Depressed, Hopeless 0   PHQ - 2 Score 0    ADLs:      No data to display            Cognitive Testing  Alert? Yes  Normal Appearance?Yes  Oriented to person? Yes  Place? Yes   Time? Yes  Recall of three objects?  Yes  Can perform simple calculations? Yes  Displays appropriate judgment?Yes  Can read the correct time from a watch face?Yes  EOL planning:       Review of Systems  Constitutional:  Negative for malaise/fatigue and weight loss.  HENT:  Negative for hearing loss and tinnitus.   Eyes:  Negative for blurred vision and double vision.  Respiratory:  Negative for cough, shortness of breath and wheezing.   Cardiovascular:  Negative for chest pain, palpitations, orthopnea, claudication and leg swelling.  Gastrointestinal:  Negative for abdominal pain, blood in stool, constipation, diarrhea, heartburn, melena, nausea and vomiting.  Genitourinary: Negative.   Musculoskeletal:  Negative for joint pain and myalgias.  Skin:  Negative for rash.  Neurological:  Negative for dizziness, tingling, sensory change, weakness and headaches.  Endo/Heme/Allergies:  Negative for polydipsia.  Psychiatric/Behavioral:  Positive for depression. Negative for substance abuse and suicidal ideas. The patient is nervous/anxious.   All other systems reviewed and are negative.    Objective:     There were no vitals filed for this visit.  There is no height or weight on file to calculate BMI.  General appearance: alert, no distress, WD/WN, female HEENT: left nasal passage swelling with erythema, and scabing normocephalic, sclerae anicteric, R TM pearly, nares patent, no discharge or erythema. L TM unable to visualize due to cerumen. pharynx normal Oral cavity: MMM, no lesions Neck: supple, no lymphadenopathy, no thyromegaly, no masses Heart: RRR, normal S1, S2, no murmurs Lungs: CTA bilaterally, no wheezes, rhonchi, or rales Abdomen: +bs, soft, non tender, non distended, no masses, no hepatomegaly, no  splenomegaly Musculoskeletal: nontender, no swelling, no obvious deformity Extremities: no edema, no cyanosis, no clubbing Pulses: 2+ symmetric, upper and lower extremities, normal cap refill Neurological: alert, oriented x 3, CN2-12 intact, strength normal upper extremities and lower extremities, sensation normal throughout, DTRs 2+ throughout, no cerebellar signs, gait normal Psychiatric: normal affect, behavior normal, pleasant   Skin: warm/dry/intact without rashes    Medicare Attestation I have personally reviewed: The patient's medical and social history Their use of alcohol, tobacco or illicit drugs Their current medications and supplements The patient's functional ability including ADLs,fall risks, home safety risks, cognitive, and hearing and visual impairment Diet and physical activities Evidence for depression or mood disorders  The patient's weight, height, BMI, and visual acuity have been recorded in the chart.  I have made referrals, counseling, and provided education to the patient based on review of the above and I have provided the patient with a written personalized care plan for preventive services.        [name], NP   01/08/2023

## 2023-01-09 ENCOUNTER — Ambulatory Visit: Payer: Medicare Other | Admitting: Nurse Practitioner

## 2023-01-09 ENCOUNTER — Other Ambulatory Visit: Payer: Self-pay | Admitting: Nurse Practitioner

## 2023-01-09 DIAGNOSIS — K2100 Gastro-esophageal reflux disease with esophagitis, without bleeding: Secondary | ICD-10-CM

## 2023-01-09 DIAGNOSIS — R7309 Other abnormal glucose: Secondary | ICD-10-CM

## 2023-01-09 DIAGNOSIS — E079 Disorder of thyroid, unspecified: Secondary | ICD-10-CM

## 2023-01-09 DIAGNOSIS — F17200 Nicotine dependence, unspecified, uncomplicated: Secondary | ICD-10-CM

## 2023-01-09 DIAGNOSIS — Z79899 Other long term (current) drug therapy: Secondary | ICD-10-CM

## 2023-01-09 DIAGNOSIS — E559 Vitamin D deficiency, unspecified: Secondary | ICD-10-CM

## 2023-01-09 DIAGNOSIS — Z6823 Body mass index (BMI) 23.0-23.9, adult: Secondary | ICD-10-CM

## 2023-01-09 DIAGNOSIS — J45909 Unspecified asthma, uncomplicated: Secondary | ICD-10-CM

## 2023-01-09 DIAGNOSIS — F3341 Major depressive disorder, recurrent, in partial remission: Secondary | ICD-10-CM

## 2023-01-09 DIAGNOSIS — Z0001 Encounter for general adult medical examination with abnormal findings: Secondary | ICD-10-CM

## 2023-01-09 DIAGNOSIS — I10 Essential (primary) hypertension: Secondary | ICD-10-CM

## 2023-01-09 DIAGNOSIS — E782 Mixed hyperlipidemia: Secondary | ICD-10-CM

## 2023-01-09 DIAGNOSIS — Z0000 Encounter for general adult medical examination without abnormal findings: Secondary | ICD-10-CM

## 2023-01-09 DIAGNOSIS — J439 Emphysema, unspecified: Secondary | ICD-10-CM

## 2023-01-09 DIAGNOSIS — I700 Atherosclerosis of aorta: Secondary | ICD-10-CM

## 2023-01-10 ENCOUNTER — Other Ambulatory Visit: Payer: Self-pay | Admitting: Nurse Practitioner

## 2023-01-10 DIAGNOSIS — E079 Disorder of thyroid, unspecified: Secondary | ICD-10-CM

## 2023-01-16 NOTE — Progress Notes (Unsigned)
MEDICARE ANNUAL WELLNESS VISIT AND 3 MONTH FOLLOW UP  Assessment:   Encounter for Medicare Annual Wellness visit Due Yearly Declines Mammogram, DEXA and colonoscopy  Essential hypertension Discussed dietary and exercise modifications -     CBC with Differential/Platelet -     COMPLETE METABOLIC PANEL WITH GFR  Aortic atherosclerosis(HCC) Control BP, weight, cholesterol and blood sugars.   Extrinsic asthma without complication, unspecified asthma severity, unspecified whether persistent Has rescue inhaler Continue Breo daily  STOP SMOKING  Gastroesophageal reflux disease, esophagitis presence not specified Well managed on current medications Discussed diet, avoiding triggers and other lifestyle changes  Hypothyroidism continue medications the same pending lab results reminded to take on an empty stomach 30-26mins before food.  -     TSH  Depression, major, recurrent, in partial remission (HCC) Taking Wellbutrin and lexapro Dr. Evelene Croon managing; overdue follow up; encouraged to schedule  Hyperlipidemia, mixed Continue medications Continue low cholesterol diet and exercise.  Check lipid panel.  -     Lipid panel  Vitamin D deficiency Continue Vit D supplementation to maintain value in therapeutic level of 60-100   Abnormal glucose Discussed dietary and exercise modifications -     Hemoglobin A1c  Smoker/Emphysema Discussed smoking cessation and resources She does not smoke everyday- only 5 cigarettes a week. Last lung Cancer screening CT was 02/06/22 , had Pet scan that showed most likely benign. Ordered repeat CT for nodule follow up  Body mass index (BMI) of 22 in adult Discussed dietary and exercise modifications  Medication management -     CBC with Differential/Platelet -     COMPLETE METABOLIC PANEL WITH GFR  Skin lesions of face Schedule appointment with Dr. Oneta Rack for removal  Plantars wart Schedule appt with Dr. Oneta Rack for removal  Skin pigment  changes - Use hydroquinone small amt 3 times a week to areas  Allergic rhinitis Flonase and allegra or zyrtec daily Prednisone taper as directed Mucinex as needed  Pneumococcal vaccine need - Prevnar 20 given  Over 40 minutes of exam, counseling, chart review and critical decision making was performed Future Appointments  Date Time Provider Department Center  05/16/2023  2:00 PM [name], NP [facility] None  01/09/2024  3:00 PM [name], NP [facility] None   Plan:   During the course of the visit the patient was educated and counseled about appropriate screening and preventive services including:   Pneumococcal vaccine  Prevnar 13 Influenza vaccine Td vaccine Screening electrocardiogram Bone densitometry screening Colorectal cancer screening Diabetes screening Glaucoma screening Nutrition counseling  Advanced directives: requested   Subjective:  Margaret Nelson is a 72 y.o. female who presents for 3 month follow up.   Patient is on SS disability for a long hx/o severe Depression circa 1990's w/hx/o ECT x 9 in 2008. She is seeing Dr Evelene Croon  She was also at one time in a long term toxic relationship with a bipolar Manic Depressive- She is now living with her daughter and not in a relationship. Prescribed Abilify 2 mg, Viibryd 40 mg QD, PRN Ativan 0.5 mg #60 filled 01/15/23 with Dr. Evelene Croon  She is also on Adderall 30 mg BID for ADD , continues to use Desyrel 50 mg for sleep  She has skin changes to above right eye and nose that has been present for several months. Wants to see Dr. Oneta Rack for removal.   Plantars wart on bottom of right foot which has returned.  Has itching raised areas on both feet  Current smoker of 1 ppd, hx of asthma, had emphysematous changes noted in lung bases per CT abd 06/2020. She has 30+ pack year history on review, smoking since she was 24, currently intermittent CT lung cancer screening 02/06/22 . 1. Lung-RADS 4A, suspicious. Follow up  low-dose chest CT without contrast in 3 months (please use the following order, "CT CHEST LCS NODULE FOLLOW-UP W/O CM") is recommended. Alternatively, PET may be considered when there is a solid component 8mm or larger.   11.5 mm area of irregular nodular consolidation in the superior segment right lower lobe.   These results will be called to the ordering clinician or representative by the Radiologist Assistant, and communication documented in the PACS or [employer]. 2. Aortic atherosclerosis (ICD10-I70.0). Coronary artery calcification. 3.  Emphysema (ICD10-J43.9).   Had follow up PET scan 03/10/22 1. Pulmonary nodules do not demonstrate significant abnormal FDG avidity, as such these may represent benign nodules or a very low-grade primary bronchogenic adenocarcinoma, particularly regarding the subsolid right apical pulmonary nodule. Suggest continued imaging surveillance. 2. Hypermetabolic asymmetric soft tissue thickening along the right glossotonsillar fold is nonspecific recommend correlation with direct visualization. 3. Small hiatal hernia with a focus hypermetabolic activity in the distal esophagus near the gastroesophageal junction which is nonspecific and may be physiologic. However, consider correlation with upper endoscopy to exclude underlying mass lesion. Still having coughing but has improved, believes it can be related to allergies.   BMI is Body mass index is 23.45 kg/m., she is working on diet and exercise. She is eating better, will monitor weight. Wt Readings from Last 3 Encounters:  01/17/23 132 lb 6.4 oz (60.1 kg)  06/13/22 137 lb 12.8 oz (62.5 kg)  03/29/22 133 lb 12.8 oz (60.7 kg)   She has had elevated blood pressure since 2008. Her blood pressure has been controlled at home, today their BP is BP: 100/62  BP Readings from Last 3 Encounters:  01/17/23 100/62  06/13/22 100/62  03/29/22 98/62  She does not workout. She denies chest pain,  shortness of breath, dizziness.   She has aortic atherosclerosis per CT 06/2020.   She is on cholesterol medication, atorvastatin 40 mg and denies myalgias. Her cholesterol is not at goal. The cholesterol last visit was:   Lab Results  Component Value Date   CHOL 191 06/13/2022   HDL 59 06/13/2022   LDLCALC 107 (H) 06/13/2022   TRIG 139 06/13/2022   CHOLHDL 3.2 06/13/2022   She has had pre diabetes with elevated A1c of 5.7 in 2012 and highest at 5.9 in 2016. She has not been working on diet and exercise for prediabetes/abnormal glucose, and denies hyperglycemia, hypoglycemia , nausea, polydipsia and polyuria. Last A1C in the office was:  Lab Results  Component Value Date   HGBA1C 5.4 06/13/2022   Last GFR: Lab Results  Component Value Date   EGFR 85 06/13/2022    Patient is on Vitamin D supplement, taking 5000 IU   Lab Results  Component Value Date   VD25OH 1 06/13/2022     She is on thyroid medication 75 mcg daily. Her medication was not changed last visit.   Lab Results  Component Value Date   TSH 0.56 06/13/2022  .    She reports taking 65 mg slow release iron recently due to hair loss concerns Lab Results  Component Value Date   IRON 108 11/15/2020   TIBC 329 11/15/2020   FERRITIN 51 11/15/2020   Lab Results  Component Value Date  WBC 9.9 06/13/2022   HGB 13.3 06/13/2022   HCT 38.7 06/13/2022   MCV 92.1 06/13/2022   PLT 287 06/13/2022    Lab Results  Component Value Date   VITAMINB12 1,512 (H) 11/15/2020     Medication Review:  Current Outpatient Medications (Endocrine & Metabolic):    levothyroxine (SYNTHROID) 75 MCG tablet, TAKE 1 TAB DAILY ON EMPTY STOMACH WITH WATER FOR 30 MIN. NO ANTACID, MEDS, OR BIOTIN FOR 4 HOURS  Current Outpatient Medications (Cardiovascular):    atorvastatin (LIPITOR) 40 MG tablet, Take 1 tablet   every other day (even days) for Cholesterol / Patient knows to take by mouth   bumetanide (BUMEX) 2 MG tablet, Take  1  tablet  2 x /day  for BP & Fluid Retention                                          /                               TAKE                                            BY                                     MOUTH  Current Outpatient Medications (Respiratory):    albuterol (PROAIR HFA) 108 (90 Base) MCG/ACT inhaler, Use 2 inhalations    15 minutes   every 4 hours      to rescue Asthma   cetirizine (ZYRTEC ALLERGY) 10 MG tablet, Take 1 tablet (10 mg total) by mouth daily.   fluticasone (FLONASE) 50 MCG/ACT nasal spray, Place 2 sprays into both nostrils daily.  Current Outpatient Medications (Analgesics):    acetaminophen (TYLENOL) 500 MG tablet, Take 500 mg by mouth every 6 (six) hours as needed.   Current Outpatient Medications (Other):    amphetamine-dextroamphetamine (ADDERALL) 30 MG tablet, Take 1 tablet by mouth 2 (two) times daily.   ARIPiprazole (ABILIFY) 2 MG tablet, Take 2 mg by mouth at bedtime.   bimatoprost (LATISSE) 0.03 % ophthalmic solution, 1 drop on applicator, apply evenly along the skin of the upper eyelid at base of eyelashes at bedtime; repeat procedure for second eye   Cholecalciferol (VITAMIN D PO), Take 5,000 Units by mouth daily. 10,000   famotidine (PEPCID) 40 MG tablet, TAKE 1 TABLET BY MOUTH EVERYDAY AT BEDTIME   Flaxseed, Linseed, (FLAX SEED OIL) 1300 MG CAPS, Take 1,300 mg by mouth 3 (three) times daily.   LORazepam (ATIVAN) 0.5 MG tablet, Take by mouth.   Magnesium 500 MG TABS, Take by mouth. Takes 4 tablets daily.   Multiple Vitamin (MULTIVITAMIN) tablet, Take 1 tablet by mouth daily.   Omega-3 Fatty Acids (FISH OIL) 1000 MG CAPS, Take 1 capsule (1,000 mg total) by mouth 3 (three) times daily.   pantoprazole (PROTONIX) 40 MG tablet, TAKE ONE TABLET BY MOUTH DAILY TO PREVENT INDIGESTION AND ACID REFLUX   potassium chloride SA (KLOR-CON M20) 20 MEQ tablet, Take  1 tablet  3 x /day  for Potassium deficiency                               /  TAKE                                         BY                           MOUTH   traZODone (DESYREL) 50 MG tablet, Take 50-100 mg by mouth at bedtime.   valACYclovir (VALTREX) 500 MG tablet, Take 2 tablets 2 x /day at Onset of Symptoms   Vilazodone HCl (VIIBRYD) 40 MG TABS, Takes 1 tablet Daily per Dr Evelene Croon   triamcinolone cream (KENALOG) 0.5 %, Apply 1 application topically 2 (two) times daily. (Patient not taking: Reported on 01/17/2023)   Allergies  Allergen Reactions   Codeine Other (See Comments)    Abdominal pain   Dexamethasone Other (See Comments)    Sweating   Erythromycin Base     GI upset   Penicillins Rash    Current Problems (verified) Patient Active Problem List   Diagnosis Date Noted   Anemia 11/16/2020   Hair thinning 11/15/2020   Aortic atherosclerosis (HCC) by Abd CT scan 10.11.2021 07/12/2020   COPD (chronic obstructive pulmonary disease) 07/01/2019   Abnormal glucose 05/21/2018   Attention deficit hyperactivity disorder (ADHD) 05/21/2018   Severe recurrent major depression without psychotic features 09/17/2017    Class: Chronic   Extrinsic asthma 07/29/2015   Body mass index (BMI) of 23.0-23.9 in adult 07/29/2015   Smoker 04/14/2015   Gastroesophageal reflux disease 02/15/2015   Medication management 02/12/2014   Hypothyroidism    Essential hypertension 08/18/2013   Hyperlipidemia, mixed 08/18/2013   Vitamin D deficiency 08/18/2013   SURGICAL HISTORY She  has a past surgical history that includes Cholecystectomy (1975); Nasal sinus surgery (1996); Liposuction (1998); Breast surgery (Bilateral, 1983); Breast reconstruction (2008); Appendectomy; bil breast lumpectomy; bilateral breast masectomy (no breast cancer); and EUS (N/A, 04/23/2014). FAMILY HISTORY Her family history includes ADD / ADHD in her son; Alcohol abuse in her father and paternal uncle; Anxiety disorder in her sister; COPD in her mother; Dementia in her mother; Depression in her mother and sister;  Diabetes in her sister; Early death in her father and sister; Emphysema in her mother; Lung cancer in her mother; Schizophrenia in her sister. SOCIAL HISTORY She  reports that she has been smoking e-cigarettes and cigarettes. She started smoking about 54 years ago. She has a 39.00 pack-year smoking history. She has never used smokeless tobacco. She reports current alcohol use of about 4.0 - 5.0 standard drinks of alcohol per week. She reports that she does not use drugs.  Immunization History  Administered Date(s) Administered   Influenza Split 06/22/2015   Influenza, High Dose Seasonal PF 07/16/2018, 07/02/2019, 07/28/2020, 08/10/2021   Influenza,inj,Quad PF,6+ Mos 07/22/2017   Influenza,inj,quad, With Preservative 07/05/2016   Influenza-Unspecified 07/22/2017   Moderna Covid-19 Vaccine Bivalent Booster 28yrs & up 07/12/2021   PFIZER Comirnaty(Gray Top)Covid-19 Tri-Sucrose Vaccine 08/17/2022   PFIZER(Purple Top)SARS-COV-2 Vaccination 11/01/2019, 11/22/2019, 10/04/2020   Pneumococcal Conjugate-13 12/03/2018   Pneumococcal-Unspecified 09/25/2004   Td 09/25/2004   Tdap 12/01/2015    Health Maintenance  Topic Date Due   Pneumonia Vaccine 38+ Years old (2 of 2 - PPSV23 or PCV20) 01/28/2019   Lung Cancer Screening  02/07/2023   COVID-19 Vaccine (6 - 2023-24 season) 02/02/2023 (Originally 10/12/2022)   Zoster Vaccines- Shingrix (1 of 2) 04/18/2023 (Originally 12/12/1969)  MAMMOGRAM  01/17/2024 (Originally 03/31/2012)   DEXA SCAN  01/17/2024 (Originally 12/11/2017)   COLONOSCOPY (Pts 45-34yrs Insurance coverage will need to be confirmed)  01/17/2024 (Originally 04/13/2017)   Hepatitis C Screening  01/17/2024 (Originally 12/12/1968)   INFLUENZA VACCINE  04/26/2023   Medicare Annual Wellness (AWV)  01/17/2024   DTaP/Tdap/Td (3 - Td or Tdap) 11/30/2025   HPV VACCINES  Aged Out     Eye Doctor : [facility] 06/2022, Cataracts removed 01/2022 both eyes  Dentist: [facility];  05/2022  MEDICARE WELLNESS OBJECTIVES: Physical activity: Current Exercise Habits: The patient does not participate in regular exercise at present, Exercise limited by: respiratory conditions(s);orthopedic condition(s) Cardiac risk factors: Cardiac Risk Factors include: advanced age (>53men, >31 women);dyslipidemia;hypertension;sedentary lifestyle;smoking/ tobacco exposure Depression/mood screen:      01/17/2023    3:50 PM  Depression screen PHQ 2/9  Decreased Interest 0  Down, Depressed, Hopeless 1  PHQ - 2 Score 1    ADLs:     01/17/2023    3:50 PM  In your present state of health, do you have any difficulty performing the following activities:  Hearing? 0  Vision? 0  Difficulty concentrating or making decisions? 0  Walking or climbing stairs? 0  Dressing or bathing? 0  Doing errands, shopping? 0     Cognitive Testing  Alert? Yes  Normal Appearance?Yes  Oriented to person? Yes  Place? Yes   Time? Yes  Recall of three objects?  Yes  Can perform simple calculations? Yes  Displays appropriate judgment?Yes  Can read the correct time from a watch face?Yes  EOL planning: Does Patient Have a Medical Advance Directive?: No Would patient like information on creating a medical advance directive?: No - Patient declined     Review of Systems  Constitutional:  Negative for malaise/fatigue and weight loss.  HENT:  Positive for congestion, ear pain and sinus pain. Negative for hearing loss and tinnitus.   Eyes:  Negative for blurred vision and double vision.  Respiratory:  Negative for cough, shortness of breath and wheezing.   Cardiovascular:  Negative for chest pain, palpitations, orthopnea, claudication and leg swelling.  Gastrointestinal:  Negative for abdominal pain, blood in stool, constipation, diarrhea, heartburn, melena, nausea and vomiting.  Genitourinary: Negative.   Musculoskeletal:  Negative for joint pain and myalgias.  Skin:  Negative for rash.       Has darkened  spot in right eyebrow, has reddened area with white scab on right forearm, has reddened small area on side of nose on right side  Neurological:  Negative for dizziness, tingling, sensory change, weakness and headaches.  Endo/Heme/Allergies:  Negative for polydipsia.  Psychiatric/Behavioral:  Positive for depression. Negative for substance abuse and suicidal ideas. The patient is nervous/anxious and has insomnia.   All other systems reviewed and are negative.    Objective:     Today's Vitals   01/17/23 1523  BP: 100/62  Pulse: 93  Temp: 97.9 F (36.6 C)  SpO2: 96%  Weight: 132 lb 6.4 oz (60.1 kg)  Height: 5\' 3"  (1.6 m)    Body mass index is 23.45 kg/m.  General appearance: alert, no distress, WD/WN, female HEENT: left nasal passage swelling with erythema, and scabing normocephalic, sclerae anicteric, TMs pearly, fluid in right ear Oral cavity: MMM, no lesions Neck: supple, no lymphadenopathy, no thyromegaly, no masses Heart: RRR, normal S1, S2, no murmurs Lungs: CTA bilaterally, no wheezes, rhonchi, or rales Abdomen: +bs, soft, non tender, non distended, no masses, no  hepatomegaly, no splenomegaly Musculoskeletal: nontender, no swelling, no obvious deformity Extremities: no edema, no cyanosis, no clubbing Pulses: 2+ symmetric, upper and lower extremities, normal cap refill Neurological: alert, oriented x 3, CN2-12 intact, strength normal upper extremities and lower extremities, sensation normal throughout, DTRs 2+ throughout, no cerebellar signs, gait normal Psychiatric: normal affect, behavior normal, pleasant   Skin: warm/dry/intact . Has plantars wart on bottom of right foot. Has some raised spots of feet that are mildly raised , discolored and itchy. She also has 1-2 cm darkened area in right eyebrow. Minor ,1 cm red area on right side of nose. 2 cm reddened area on right forearm with white crusty center     Medicare Attestation I have personally reviewed: The patient's  medical and social history Their use of alcohol, tobacco or illicit drugs Their current medications and supplements The patient's functional ability including ADLs,fall risks, home safety risks, cognitive, and hearing and visual impairment Diet and physical activities Evidence for depression or mood disorders  The patient's weight, height, BMI, and visual acuity have been recorded in the chart.  I have made referrals, counseling, and provided education to the patient based on review of the above and I have provided the patient with a written personalized care plan for preventive services.        [name], NP   01/17/2023

## 2023-01-17 ENCOUNTER — Ambulatory Visit (INDEPENDENT_AMBULATORY_CARE_PROVIDER_SITE_OTHER): Payer: Medicare Other | Admitting: Nurse Practitioner

## 2023-01-17 ENCOUNTER — Other Ambulatory Visit: Payer: Self-pay | Admitting: Nurse Practitioner

## 2023-01-17 ENCOUNTER — Encounter: Payer: Self-pay | Admitting: Nurse Practitioner

## 2023-01-17 VITALS — BP 100/62 | HR 93 | Temp 97.9°F | Ht 63.0 in | Wt 132.4 lb

## 2023-01-17 DIAGNOSIS — F3341 Major depressive disorder, recurrent, in partial remission: Secondary | ICD-10-CM

## 2023-01-17 DIAGNOSIS — E559 Vitamin D deficiency, unspecified: Secondary | ICD-10-CM | POA: Diagnosis not present

## 2023-01-17 DIAGNOSIS — R7309 Other abnormal glucose: Secondary | ICD-10-CM | POA: Diagnosis not present

## 2023-01-17 DIAGNOSIS — R6889 Other general symptoms and signs: Secondary | ICD-10-CM

## 2023-01-17 DIAGNOSIS — L989 Disorder of the skin and subcutaneous tissue, unspecified: Secondary | ICD-10-CM

## 2023-01-17 DIAGNOSIS — Z79899 Other long term (current) drug therapy: Secondary | ICD-10-CM

## 2023-01-17 DIAGNOSIS — J439 Emphysema, unspecified: Secondary | ICD-10-CM | POA: Diagnosis not present

## 2023-01-17 DIAGNOSIS — I10 Essential (primary) hypertension: Secondary | ICD-10-CM

## 2023-01-17 DIAGNOSIS — E782 Mixed hyperlipidemia: Secondary | ICD-10-CM

## 2023-01-17 DIAGNOSIS — J301 Allergic rhinitis due to pollen: Secondary | ICD-10-CM

## 2023-01-17 DIAGNOSIS — F17200 Nicotine dependence, unspecified, uncomplicated: Secondary | ICD-10-CM | POA: Diagnosis not present

## 2023-01-17 DIAGNOSIS — E079 Disorder of thyroid, unspecified: Secondary | ICD-10-CM | POA: Diagnosis not present

## 2023-01-17 DIAGNOSIS — Z23 Encounter for immunization: Secondary | ICD-10-CM

## 2023-01-17 DIAGNOSIS — Z0001 Encounter for general adult medical examination with abnormal findings: Secondary | ICD-10-CM

## 2023-01-17 DIAGNOSIS — K2100 Gastro-esophageal reflux disease with esophagitis, without bleeding: Secondary | ICD-10-CM

## 2023-01-17 DIAGNOSIS — I700 Atherosclerosis of aorta: Secondary | ICD-10-CM

## 2023-01-17 DIAGNOSIS — R21 Rash and other nonspecific skin eruption: Secondary | ICD-10-CM

## 2023-01-17 DIAGNOSIS — Z6823 Body mass index (BMI) 23.0-23.9, adult: Secondary | ICD-10-CM | POA: Diagnosis not present

## 2023-01-17 DIAGNOSIS — R911 Solitary pulmonary nodule: Secondary | ICD-10-CM

## 2023-01-17 DIAGNOSIS — J3489 Other specified disorders of nose and nasal sinuses: Secondary | ICD-10-CM

## 2023-01-17 DIAGNOSIS — L819 Disorder of pigmentation, unspecified: Secondary | ICD-10-CM

## 2023-01-17 DIAGNOSIS — B070 Plantar wart: Secondary | ICD-10-CM

## 2023-01-17 MED ORDER — HYDROQUINONE 4 % EX EMUL
CUTANEOUS | 1 refills | Status: DC
Start: 2023-01-17 — End: 2023-01-17

## 2023-01-17 MED ORDER — PREDNISONE 20 MG PO TABS
ORAL_TABLET | ORAL | 0 refills | Status: AC
Start: 2023-01-17 — End: 2023-01-28

## 2023-01-17 MED ORDER — MUPIROCIN 2 % EX OINT
TOPICAL_OINTMENT | CUTANEOUS | 3 refills | Status: AC
Start: 2023-01-17 — End: ?

## 2023-01-17 NOTE — Patient Instructions (Addendum)
Use Flonase daily Use generic Zyrtec or Allegra until pollen season is done Prednisone taper as directed Mucinex as needed  Use Hydroquinone small amount to darkened areas three times a week   Allergic Rhinitis, Adult  Allergic rhinitis is an allergic reaction that affects the mucous membrane inside the nose. The mucous membrane is the tissue that produces mucus. There are two types of allergic rhinitis: Seasonal. This type is also called hay fever and happens only during certain seasons. Perennial. This type can happen at any time of the year. Allergic rhinitis cannot be spread from person to person. This condition can be mild, bad, or very bad. It can develop at any age and may be outgrown. What are the causes? This condition is caused by allergens. These are things that can cause an allergic reaction. Allergens may differ for seasonal allergic rhinitis and perennial allergic rhinitis. Seasonal allergic rhinitis is caused by pollen. Pollen can come from grasses, trees, and weeds. Perennial allergic rhinitis may be caused by: Dust mites. Proteins in a pet's pee (urine), saliva, or dander. Dander is dead skin cells from a pet. Smoke, mold, or car fumes. Remains of or waste from insects such as cockroaches. What increases the risk? You are more likely to develop this condition if you have a family history of allergies or other conditions related to allergies, including: Allergic conjunctivitis. This is irritation and swelling of parts of the eyes and eyelids. Asthma. This condition affects the lungs and makes it hard to breathe. Atopic dermatitis or eczema. This is long term (chronic) irritation and swelling of the skin. Food allergies. What are the signs or symptoms? Symptoms of this condition include: Sneezing or coughing. A stuffy nose (nasal congestion), itchy nose, or nasal discharge. Itchy eyes and tearing of the eyes. A feeling of mucus dripping down the back of your throat  (postnasal drip). This may cause a sore throat. Trouble sleeping. Tiredness. Headache. How is this diagnosed? This condition may be diagnosed with your symptoms, your medical history, and a physical exam. Your health care provider may check for related conditions, such as: Asthma. Pink eye. This is eye swelling and irritation caused by infection (conjunctivitis). Ear infection. Upper respiratory infection. This is an infection in the nose, throat, or upper airways. You may also have tests to find out which allergens cause your symptoms. These may include skin tests or blood tests. How is this treated? There is no cure for this condition, but treatment can help control symptoms. Treatment may include: Taking medicines that block allergy symptoms, such as corticosteroids (anti-inflammatories) and antihistamines. Medicine may be given as a shot, nasal spray, or pill. Avoiding any allergens. Being exposed again and again to tiny amounts of allergens to help you build a defense against allergens (allergenimmunotherapy). This is done if other treatments have not helped. It may include: Allergy shots. These are injected medicines that have small amounts of an allergen in them. Sublingual immunotherapy. This involves taking small doses of a medicine with an allergen in it under your tongue. If these treatments do not work, your provider may prescribe newer, stronger medicines. Follow these instructions at home: Avoiding allergens Find out what you are allergic to and avoid those allergens. These are some things you can do to help avoid allergens: If you have perennial allergies: Replace carpet with wood, tile, or vinyl flooring. Carpet can trap dander and dust. Do not smoke. Do not allow smoking in your home Change your heating and air conditioning filters at  least once a month. If you have seasonal allergies, take these steps during allergy season: Keep windows closed as much as possible. Plan  outdoor activities when pollen counts are lowest. Check pollen counts before you plan outdoor activities When coming indoors, change clothing and shower before sitting on furniture or bedding. If you have a pet in the house that produces allergens: Keep the pet out of the bedroom. Vacuum, sweep, and dust regularly. General instructions Take over-the-counter and prescription medicines only as told by your provider. Drink enough fluid to keep your pee pale yellow. Where to find more information American Academy of Allergy, Asthma & Immunology: aaaai.org Contact a health care provider if: You have a fever. You develop a cough that does not go away. You make high-pitched whistling sounds when you breathe, most often when you breathe out (wheeze). Your symptoms slow you down or stop you from doing your normal activities each day. Get help right away if: You have shortness of breath. This symptom may be an emergency. Get help right away. Call 911. Do not wait to see if the symptoms will go away. Do not drive yourself to the hospital. This information is not intended to replace advice given to you by your health care provider. Make sure you discuss any questions you have with your health care provider. Document Revised: 05/22/2022 Document Reviewed: 05/22/2022 Elsevier Patient Education  2023 [employer].

## 2023-01-18 ENCOUNTER — Other Ambulatory Visit: Payer: Self-pay | Admitting: Nurse Practitioner

## 2023-01-18 DIAGNOSIS — E079 Disorder of thyroid, unspecified: Secondary | ICD-10-CM

## 2023-01-18 LAB — COMPLETE METABOLIC PANEL WITH GFR
AG Ratio: 1.7 (calc) (ref 1.0–2.5)
ALT: 15 U/L (ref 6–29)
AST: 13 U/L (ref 10–35)
Albumin: 4.3 g/dL (ref 3.6–5.1)
Alkaline phosphatase (APISO): 100 U/L (ref 37–153)
BUN: 15 mg/dL (ref 7–25)
CO2: 31 mmol/L (ref 20–32)
Calcium: 9.7 mg/dL (ref 8.6–10.4)
Chloride: 99 mmol/L (ref 98–110)
Creat: 0.66 mg/dL (ref 0.60–1.00)
Globulin: 2.6 g/dL (calc) (ref 1.9–3.7)
Glucose, Bld: 104 mg/dL — ABNORMAL HIGH (ref 65–99)
Potassium: 4.3 mmol/L (ref 3.5–5.3)
Sodium: 142 mmol/L (ref 135–146)
Total Bilirubin: 0.4 mg/dL (ref 0.2–1.2)
Total Protein: 6.9 g/dL (ref 6.1–8.1)
eGFR: 94 mL/min/{1.73_m2} (ref >=60)

## 2023-01-18 LAB — CBC WITH DIFFERENTIAL/PLATELET
Absolute Monocytes: 526 cells/uL (ref 200–950)
Basophils Absolute: 122 cells/uL (ref 0–200)
Basophils Relative: 1.3 %
Eosinophils Absolute: 235 cells/uL (ref 15–500)
Eosinophils Relative: 2.5 %
HCT: 41.6 % (ref 35.0–45.0)
Hemoglobin: 13.8 g/dL (ref 11.7–15.5)
Lymphs Abs: 3769 cells/uL (ref 850–3900)
MCH: 30.7 pg (ref 27.0–33.0)
MCHC: 33.2 g/dL (ref 32.0–36.0)
MCV: 92.4 fL (ref 80.0–100.0)
MPV: 10.7 fL (ref 7.5–12.5)
Monocytes Relative: 5.6 %
Neutro Abs: 4747 cells/uL (ref 1500–7800)
Neutrophils Relative %: 50.5 %
Platelets: 400 10*3/uL (ref 140–400)
RBC: 4.5 10*6/uL (ref 3.80–5.10)
RDW: 12.5 % (ref 11.0–15.0)
Total Lymphocyte: 40.1 %
WBC: 9.4 10*3/uL (ref 3.8–10.8)

## 2023-01-18 LAB — LIPID PANEL
Cholesterol: 150 mg/dL (ref <200)
HDL: 57 mg/dL (ref >=50)
LDL Cholesterol (Calc): 70 mg/dL (calc)
Non-HDL Cholesterol (Calc): 93 mg/dL (calc) (ref <130)
Total CHOL/HDL Ratio: 2.6 (calc) (ref <5.0)
Triglycerides: 151 mg/dL — ABNORMAL HIGH (ref <150)

## 2023-01-18 LAB — HEMOGLOBIN A1C
Hgb A1c MFr Bld: 6.1 % of total Hgb — ABNORMAL HIGH (ref <5.7)
Mean Plasma Glucose: 128 mg/dL
eAG (mmol/L): 7.1 mmol/L

## 2023-01-18 LAB — TSH: TSH: 0.38 mIU/L — ABNORMAL LOW (ref 0.40–4.50)

## 2023-01-18 MED ORDER — LEVOTHYROXINE SODIUM 75 MCG PO TABS
ORAL_TABLET | ORAL | 1 refills | Status: DC
Start: 2023-01-18 — End: 2023-08-01

## 2023-01-28 ENCOUNTER — Encounter: Payer: Self-pay | Admitting: Internal Medicine

## 2023-01-28 NOTE — Progress Notes (Unsigned)
Future Appointments  Date Time Provider Department  01/29/2023 11:30 AM Lucky Cowboy, MD [facility]  05/16/2023  2:00 PM [name], NP [facility]  01/09/2024  3:00 PM [name], NP [facility]    History of Present Illness:      This very nice 72 y.o. DWF with HTN, HLD, Pre-Diabetes , Hypothyroid, Depression, COPD and Vitamin D Deficiency  who  presents for evaluation of skin lesions on her face (above Rt eye &Rt side of  nose) & also plantar warts  of the Rt foot.    Current Outpatient Medications on File Prior to Visit  Medication Sig   acetaminophen (TYLENOL) 500 MG tablet Take 500 mg by mouth every 6 (six) hours as needed.   albuterol (PROAIR HFA) 108 (90 Base) MCG/ACT inhaler Use 2 inhalations    15 minutes   every 4 hours      to rescue Asthma   amphetamine-dextroamphetamine (ADDERALL) 30 MG tablet Take 1 tablet by mouth 2 (two) times daily.   ARIPiprazole (ABILIFY) 2 MG tablet Take 2 mg by mouth at bedtime.   atorvastatin (LIPITOR) 40 MG tablet Take 1 tablet   every other day (even days) for Cholesterol / Patient knows to take by mouth   bimatoprost (LATISSE) 0.03 % ophthalmic solution 1 drop on applicator, apply evenly along the skin of the upper eyelid at base of eyelashes at bedtime; repeat procedure for second eye   bumetanide (BUMEX) 2 MG tablet Take  1 tablet  2 x /day  for BP & Fluid Retention                                          /                               TAKE                                            BY                                     MOUTH   Cholecalciferol (VITAMIN D PO) Take 5,000 Units by mouth daily. 10,000   famotidine (PEPCID) 40 MG tablet TAKE 1 TABLET BY MOUTH EVERYDAY AT BEDTIME   Flaxseed, Linseed, (FLAX SEED OIL) 1300 MG CAPS Take 1,300 mg by mouth 3 (three) times daily.   hydroquinone 4 % cream USE SMALL AMOUNT THREE TIMES A WEEK FOR DARK SPOTS   levothyroxine (SYNTHROID) 75 MCG tablet TAKE 1 TAB DAILY  EXCEPT WEDNESDAYS TAKE  1/2 TAB ON EMPTY STOMACH WITH WATER FOR 30 MIN. NO ANTACID, MEDS, OR BIOTIN FOR 4 HOURS   LORazepam (ATIVAN) 0.5 MG tablet Take by mouth.   Magnesium 500 MG TABS Take by mouth. Takes 4 tablets daily.   Multiple Vitamin (MULTIVITAMIN) tablet Take 1 tablet by mouth daily.   mupirocin ointment (BACTROBAN) 2 % Apply to Skin Infection 2 x /day  (Dx - L08.9)   Omega-3 Fatty Acids (FISH OIL) 1000 MG CAPS Take 1 capsule (1,000 mg total) by mouth 3 (three) times daily.   pantoprazole (PROTONIX) 40  MG tablet TAKE ONE TABLET BY MOUTH DAILY TO PREVENT INDIGESTION AND ACID REFLUX   potassium chloride SA (KLOR-CON M20) 20 MEQ tablet Take  1 tablet  3 x /day  for Potassium deficiency                               /                        TAKE                                        BY                           MOUTH   traZODone (DESYREL) 50 MG tablet Take 50-100 mg by mouth at bedtime.   valACYclovir (VALTREX) 500 MG tablet Take 2 tablets 2 x /day at Onset of Symptoms   Vilazodone HCl (VIIBRYD) 40 MG TABS Takes 1 tablet Daily per Dr Evelene Croon   cetirizine (ZYRTEC ALLERGY) 10 MG tablet Take 1 tablet (10 mg total) by mouth daily.   fluticasone (FLONASE) 50 MCG/ACT nasal spray Place 2 sprays into both nostrils daily.   No current facility-administered medications on file prior to visit.    Allergies  Allergen Reactions   Codeine Other (See Comments)    Abdominal pain   Dexamethasone Other (See Comments)    Sweating   Erythromycin Base     GI upset   Penicillins Rash     Problem list She has Essential hypertension; Hyperlipidemia, mixed; Vitamin D deficiency; Hypothyroidism; Medication management; Gastroesophageal reflux disease; Smoker; Extrinsic asthma; Body mass index (BMI) of 23.0-23.9 in adult; Severe recurrent major depression without psychotic features (HCC); Abnormal glucose; Attention deficit hyperactivity disorder (ADHD); COPD (chronic obstructive pulmonary disease) (HCC); Aortic atherosclerosis (HCC)  by Abd CT scan 10.11.2021; Hair thinning; and Anemia on their problem list.   Observations/Objective:  BP 124/78   Pulse 100   Temp 97.8 F (36.6 C)   Resp 16   Ht 5\' 4"  (1.626 m)   Wt 136 lb (61.7 kg)   SpO2 97%   BMI 23.34 kg/m                                           Skin focused exam finds 3 small plantar warts of the Rt foot - all treated by cryosurgery with liquid Nitrogen by triple freeze - thaw technique.                      Also patient has several suspect reddened superficial crusting lesions of the face suspect for solar  keratoses .    Assessment and Plan:      Follow Up Instructions:        I discussed the assessment and treatment plan with the patient. The patient was provided an opportunity to ask questions and all were answered. The patient agreed with the plan and demonstrated an understanding of the instructions.       The patient was advised to call back or seek an in-person evaluation if the symptoms worsen or if the condition fails to improve as anticipated.  Kirtland Bouchard, MD

## 2023-01-29 ENCOUNTER — Encounter: Payer: Self-pay | Admitting: Internal Medicine

## 2023-01-29 ENCOUNTER — Ambulatory Visit (INDEPENDENT_AMBULATORY_CARE_PROVIDER_SITE_OTHER): Payer: Medicare Other | Admitting: Internal Medicine

## 2023-01-29 VITALS — BP 124/78 | HR 100 | Temp 97.8°F | Resp 16 | Ht 64.0 in | Wt 136.0 lb

## 2023-01-29 DIAGNOSIS — D485 Neoplasm of uncertain behavior of skin: Secondary | ICD-10-CM | POA: Diagnosis not present

## 2023-01-29 DIAGNOSIS — B070 Plantar wart: Secondary | ICD-10-CM | POA: Diagnosis not present

## 2023-01-29 NOTE — Patient Instructions (Signed)
Plantar Warts Warts are small growths on the skin. When they occur on the underside (sole) of the foot, they are called plantar warts. Plantar warts often occur in groups, with several small warts around a larger wart. They tend to develop on the heel or the ball of the foot. They may grow into the deeper layers of skin or rise above the surface of the skin. Most warts are not painful, and they usually do not cause problems. However, plantar warts may cause pain when you walk because pressure is applied to them. Plantar warts may spread to other areas of the sole. They can also spread to other areas of the body through direct and indirect contact. Warts often go away on their own in time. Various treatments may be done if needed or desired. What are the causes? Plantar warts are caused by a type of virus that is called human papillomavirus (HPV). Walking barefoot can cause exposure to the virus, especially if your feet are wet. HPV attacks a break in the skin of the foot. What increases the risk? You are more likely to develop this condition if you: Are between 10 and 20 years of age. Use public showers or locker rooms. Have a weakened body defense system (immune system). What are the signs or symptoms? Common symptoms of this condition include: Flat or slightly raised growths that have a rough surface and look similar to a callus. Pain when you use your foot to support your body weight. How is this diagnosed? A plantar wart can usually be diagnosed from its appearance. In some cases, a tissue sample may be removed (biopsy) to be looked at under a microscope. How is this treated? In many cases, warts do not need treatment. Without treatment, they often go away with time. If treatment is needed or desired, options may include: Applying medicated solutions, creams, or patches to the wart. These may be over-the-counter or prescription medicines that make the skin soft so that layers will gradually  shed away. In many cases, the medicine is applied one or two times a day and covered with a bandage. Freezing the wart with liquid nitrogen (cryotherapy). Burning the wart with: Laser treatment. An electrified probe (electrocautery). Injecting a medicine (Candida antigen) into the wart to help the body's immune system fight off the wart. Having surgery to remove the wart. Putting duct tape over the top of the wart (occlusion). You will leave the tape in place for as long as told by your health care provider, and then you will replace it with a new strip of tape. This is done until the wart goes away. Repeat treatment may be needed if you choose to remove warts. Warts sometimes go away and come back again. Follow these instructions at home: Apply medicated creams or solutions only as told by your health care provider. This may involve: Soaking the affected area in warm water. Removing the top layer of softened skin before you apply the medicine. A pumice stone works well for removing the skin. Applying a bandage over the affected area after you apply the medicine. Repeating the process daily or as told by your health care provider. Do not scratch or pick at a wart. Wash your hands after you touch a wart. If a wart is painful, try covering it with a bandage that has a hole in the middle. This helps to take pressure off the wart. Keep all follow-up visits as told by your health care provider. This is important. How   is this prevented? Take these actions to help prevent warts: Wear shoes and socks. Change your socks daily. Keep your feet clean and dry. Do not walk barefoot in shared locker rooms, shower areas, or swimming pools. Check your feet regularly. Avoid direct contact with warts on other people. Contact a health care provider if: Your warts do not improve after treatment. You have redness, swelling, or pain at the site of a wart. You have bleeding from a wart that does not stop with  light pressure. You have diabetes and you develop a wart. Summary Warts are small growths on the skin. When they occur on the underside (sole) of the foot, they are called plantar warts. In many cases, warts do not need treatment. Without treatment, they often go away with time. Apply medicated creams or solutions only as told by your health care provider. Do not scratch or pick at a wart. Wash your hands after you touch a wart. Keep all follow-up visits as told by your health care provider. This is important. This information is not intended to replace advice given to you by your health care provider. Make sure you discuss any questions you have with your health care provider. Document Revised: 12/30/2020 Document Reviewed: 12/30/2020 Else[employer] Patient Education  2023 [employer]  

## 2023-02-03 ENCOUNTER — Other Ambulatory Visit: Payer: Self-pay | Admitting: Internal Medicine

## 2023-02-03 MED ORDER — DOXYCYCLINE HYCLATE 100 MG PO CAPS: ORAL_CAPSULE | ORAL | 0 refills | Status: AC

## 2023-02-13 IMAGING — CT CT CHEST LUNG CANCER SCREENING LOW DOSE W/O CM
2 of 5 series · 15 of 40 positions shown, 18 images · non-contrast
Comparison: None Available.

CLINICAL DATA: Current smoker, 43 pack-year history.



[Series 4: lung 1.00 br44 cor · coronal · 0.68mm/px · 3 of 314 slices shown]
[im 63/314  lung]
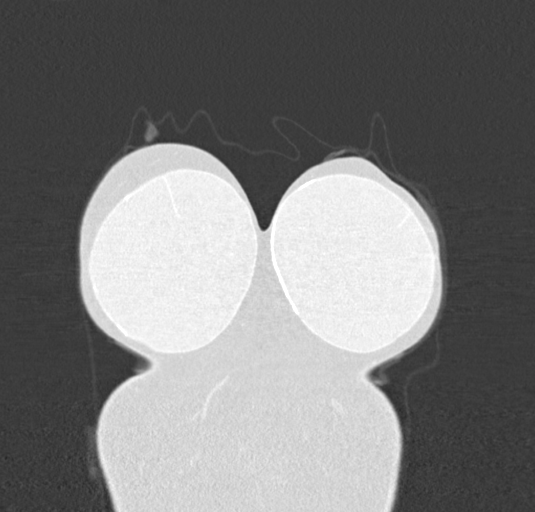
[im 126/314  lung]
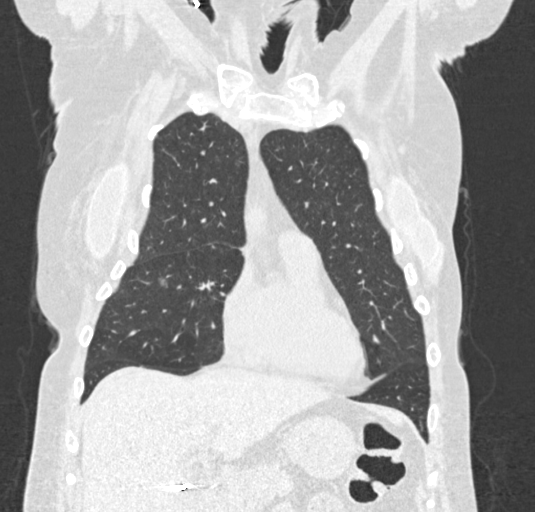
[im 188/314  lung]
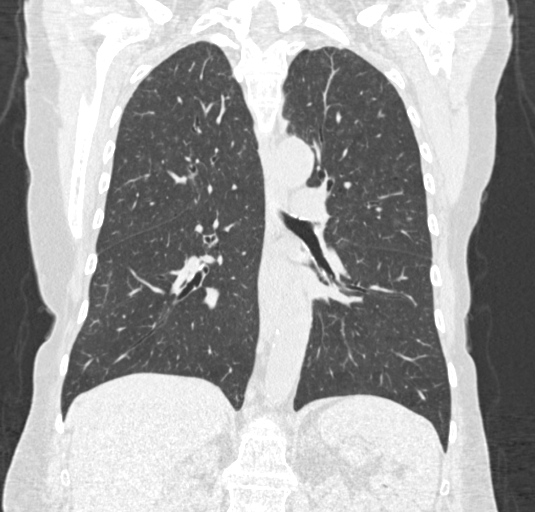

[Series 9: lung 1.00 br60 axial · axial · 0.71mm/px · z∈[-1307,-990]mm · 12 of 349 slices shown, 15 images]
[im 16/349  mediastinal]
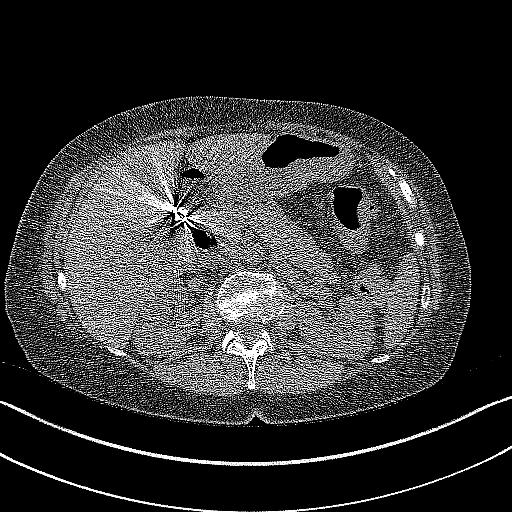
[im 16/349  lung]
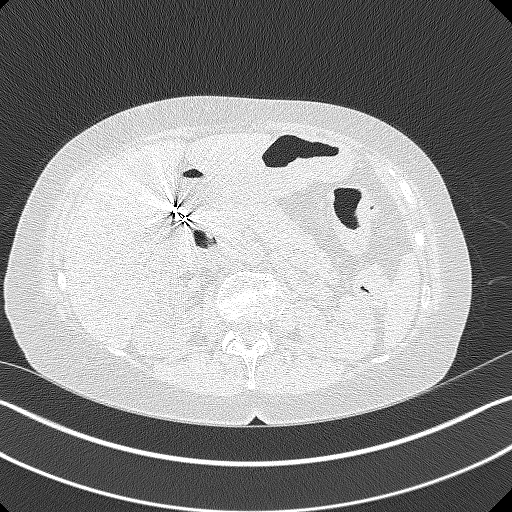
[im 48/349  lung]
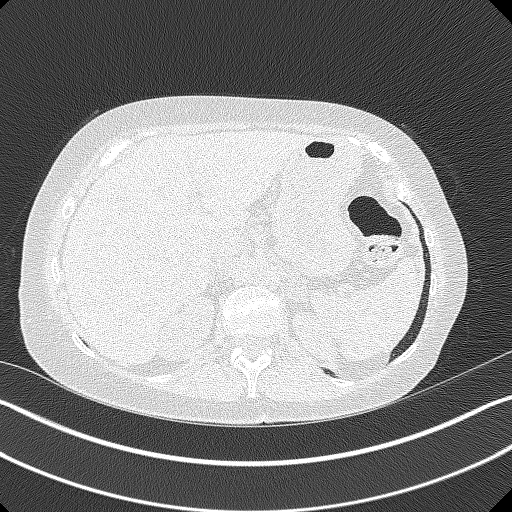
[im 80/349  lung]
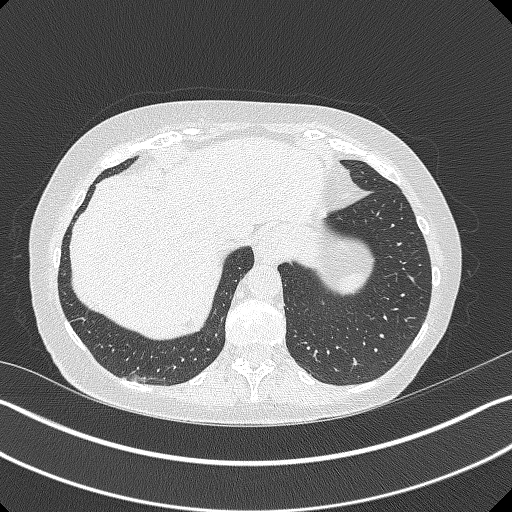
[im 111/349  lung]
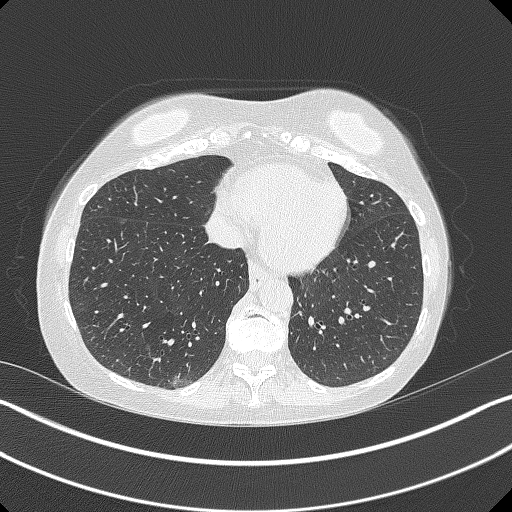
[im 127/349  mediastinal]
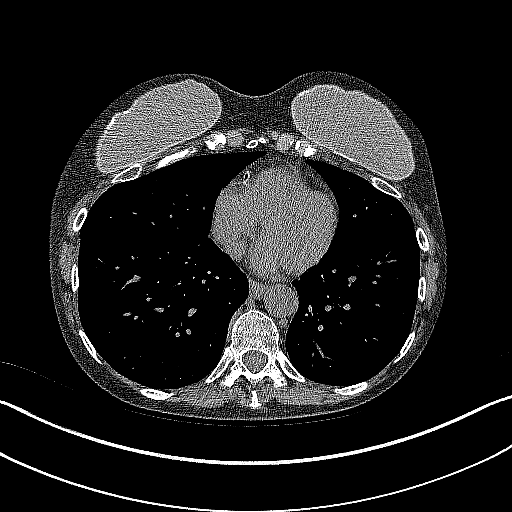
[im 127/349  lung]
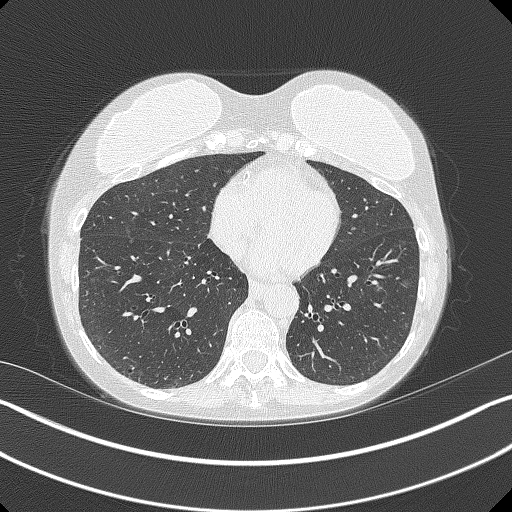
[im 159/349  lung]
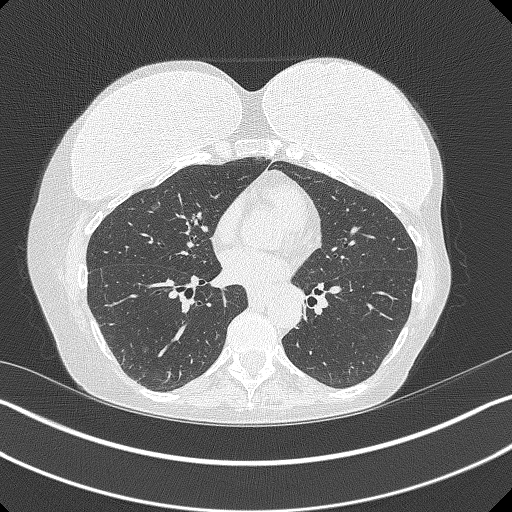
[im 190/349  lung]
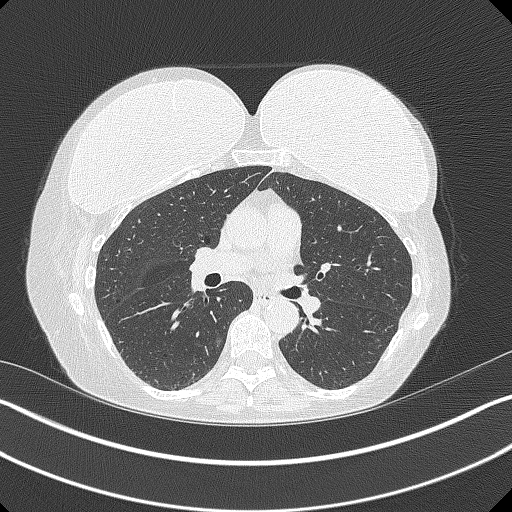
[im 222/349  lung]
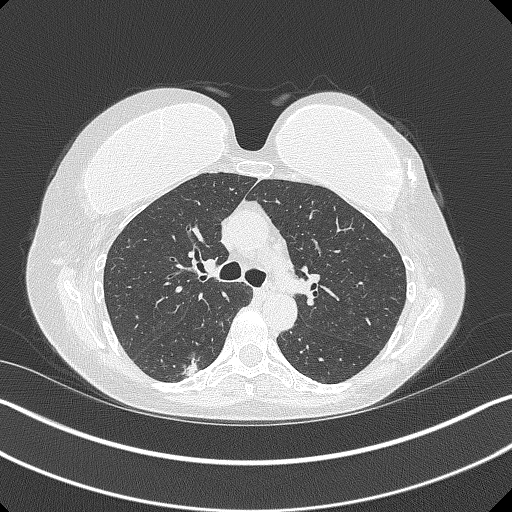
[im 238/349  mediastinal]
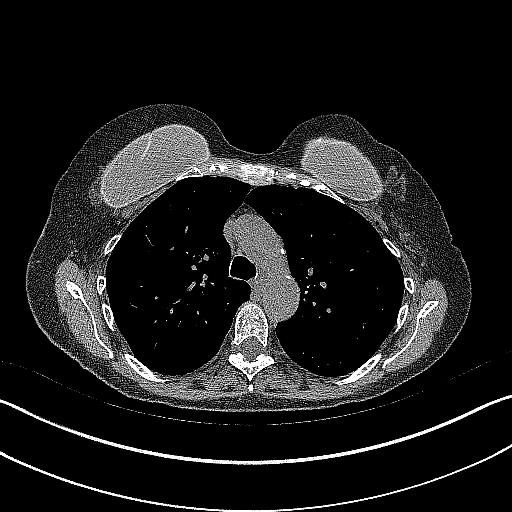
[im 238/349  lung]
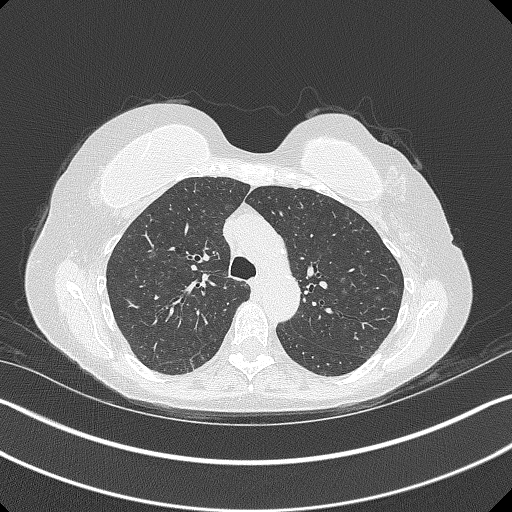
[im 269/349  lung]
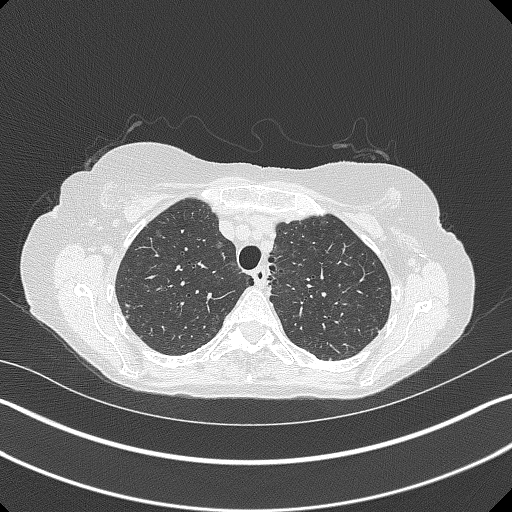
[im 301/349  lung]
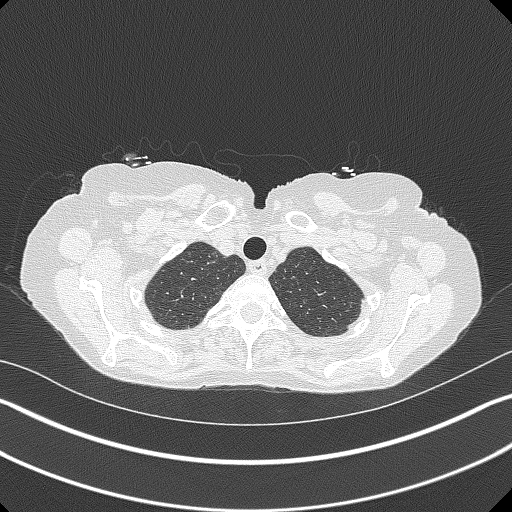
[im 333/349  lung]
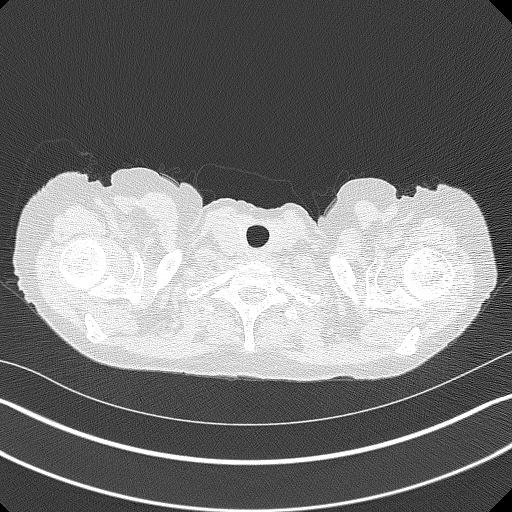

[15 of 40 positions shown; findings below may reference images not displayed]

FINDINGS: Cardiovascular: Atherosclerotic calcification of the aorta and
coronary arteries. Heart size normal. No pericardial effusion.

Mediastinum/Nodes: No pathologically enlarged mediastinal or
axillary lymph nodes. Surgical clips in the left axilla. Hilar
regions are difficult to evaluate without IV contrast. Esophagus is
grossly unremarkable.

Lungs/Pleura: Centrilobular and paraseptal emphysema. Significant
smoking related respiratory bronchiolitis. 11.5 mm area of irregular
nodular consolidation in the superior segment right lower lobe
(9/129). Mixed solid and ground-glass lesion in the apical segment
right upper lobe overall measures 13.9 mm with a 4.8 mm internal
solid component (9/64). Additional ground-glass or solid pulmonary
nodules measure 9.1 mm or less in size. No pleural fluid. Debris is
seen in the airway.

Upper Abdomen: Visualize portions of the liver and right adrenal
gland are unremarkable. Left adrenal thickening. No follow-up
necessary. Visualized portions of the kidneys, spleen, pancreas,
stomach and bowel are unremarkable with exception of a small hiatal
hernia and duodenal diverticulum. Cholecystectomy. Small fat
containing bilateral Bochdalek hernias.

Musculoskeletal: No worrisome lytic or sclerotic lesions.
IMPRESSION: 1. Lung-RADS 4A, suspicious. Follow up low-dose chest CT without
contrast in 3 months (please use the following order, "CT CHEST LCS
NODULE FOLLOW-UP W/O CM") is recommended. Alternatively, PET may be
considered when there is a solid component 8mm or larger.

11.5 mm area of irregular nodular consolidation in the superior
segment right lower lobe.

These results will be called to the ordering clinician or
representative by the Radiologist Assistant, and communication
documented in the PACS or [REDACTED].
2. Aortic atherosclerosis ([id number]). Coronary artery
calcification.
3.  Emphysema ([id number]).

## 2023-03-14 ENCOUNTER — Other Ambulatory Visit: Payer: Medicare Other

## 2023-03-27 ENCOUNTER — Other Ambulatory Visit: Payer: Self-pay

## 2023-03-27 MED ORDER — PANTOPRAZOLE SODIUM 40 MG PO TBEC: DELAYED_RELEASE_TABLET | ORAL | 1 refills | Status: DC

## 2023-04-06 ENCOUNTER — Other Ambulatory Visit: Payer: Medicare Other

## 2023-04-20 ENCOUNTER — Other Ambulatory Visit: Payer: Self-pay | Admitting: Internal Medicine

## 2023-04-20 DIAGNOSIS — R609 Edema, unspecified: Secondary | ICD-10-CM

## 2023-04-20 DIAGNOSIS — I10 Essential (primary) hypertension: Secondary | ICD-10-CM

## 2023-05-07 ENCOUNTER — Other Ambulatory Visit: Payer: Self-pay | Admitting: Nurse Practitioner

## 2023-05-15 NOTE — Progress Notes (Deleted)
CPE  Assessment:  Encounter for general adult medical examination with abnormal findings Due Yearly  Aortic atherosclerosis(HCC) per CT 02/06/22 Control weight, blood sugar, blood pressure and cholesterol  Pulmonary Emphysema(HCC) per CT 02/06/22 Continue Albuterol PRN Quit Smoking Follow up with Dr. Tonia Brooms, missed initial appointment- given number to call and reschedule  Essential hypertension Discussed dietary and exercise modifications -     CBC with Differential/Platelet -     COMPLETE METABOLIC PANEL WITH GFR - Microalbumin/creatinine urine ratio -  Routine UA with reflex microscopic  Extrinsic asthma without complication, unspecified asthma severity, unspecified whether persistent Has rescue inhaler Continue Breo daily  STOP SMOKING Schedule appointment with Dr. Tonia Brooms pulmonology  Gastroesophageal reflux disease, esophagitis presence not specified Well managed on current medications Discussed diet, avoiding triggers and other lifestyle changes  Hypothyroidism continue medications the same pending lab results reminded to take on an empty stomach 30-43mins before food.  -     TSH  Abnormal glucose Continue diet and exercise -A1c  Depression, major, recurrent, in partial remission (HCC) Taking Wellbutrin and lexapro Dr. Amie Portland managing; overdue follow up; encouraged to schedule  Hyperlipidemia, mixed Continue medications Continue low cholesterol diet and exercise.  Check lipid panel.  -     Lipid panel  Vitamin D deficiency -     VITAMIN D 25 Hydroxy (Vit-D Deficiency, Fractures)  Abnormal glucose Discussed dietary and exercise modifications -     Hemoglobin A1c  Smoker Discussed smoking cessation and resources Not ready to quit at this time Will continue to assess readiness CT Chest lung cancer screen 02/06/22- suspicious. PET scan than was not abnormal, repeat CT 01/2023 Schedule appointment with Dr. Tonia Brooms   Body mass index (BMI) of 23.0-23.9 in  adult Discussed dietary and exercise modifications  Medication management -     CBC with Differential/Platelet -     COMPLETE METABOLIC PANEL WITH GFR -     Magnesium    Over 40 minutes of exam, counseling, chart review and critical decision making was performed Future Appointments  Date Time Provider Department Center  05/16/2023  2:00 PM [name], NP [facility] None  01/09/2024  3:00 PM [name], NP [facility] None  05/15/2024  2:00 PM [name], NP [facility] None    Subjective:  Margaret Nelson is a 72 y.o. female who presents for 3 month follow up.   Patient is on [employer] disability for a long hx/o severe Depression circa 1990's w/hx/o ECT x 9 in 2008. She is seeing Dr Evelene Croon.  She was also at one time in a long term toxic relationship with a bipolar Manic Depressive- She is now living with her daughter and not in the relationship. Prescribed adderall 20 mg, abilify 2 mg, and Viibyrd 40 mg daily.  Mood is not currently controlled with current regimen. She needs to call for sooner appointment as next appointment is not until December. She is making dream catchers and selling them.   Currently smoking 1/2 ppd, hx of asthma, had emphysematous changes noted in lung bases per CT abd 06/2020. She has 30+ pack year history on review, smoking since she was 18, currently intermittently. CT Chest lung cancer screen 02/06/22- suspicious. PET scan than was not abnormal, repeat CT 01/2023. She was to follow up with Dr. Tonia Brooms pulmonology but missed appointment, wants to reschedule.  She has been having symptoms of sinus congestion and productive cough of green mucus She has also noted pain in her left ear.   She  had bilateral catarracts removed 01/2022. She had to go back to eye doctor yesterday because she was having visual difficulties.  She has fluid in the eyes after surgery and is to start drops- Dr Cephus Shelling in [place], continues to follow.   She had bilateral mastectomy 1982  BMI  is There is no height or weight on file to calculate BMI., she is working on diet and exercise.  Wt Readings from Last 3 Encounters:  01/29/23 136 lb (61.7 kg)  01/17/23 132 lb 6.4 oz (60.1 kg)  06/13/22 137 lb 12.8 oz (62.5 kg)   She has had elevated blood pressure since 2008. Her blood pressure has been controlled at home, today their BP is    BP Readings from Last 3 Encounters:  01/29/23 124/78  01/17/23 100/62  06/13/22 100/62  She does not workout. She denies chest pain, shortness of breath, dizziness.    She has aortic atherosclerosis per CT 06/2020.   She is on cholesterol medication, atorvastatin 40 mg,  and denies myalgias. Her cholesterol is not at goal. The cholesterol last visit was:   Lab Results  Component Value Date   CHOL 150 01/17/2023   HDL 57 01/17/2023   LDLCALC 70 01/17/2023   TRIG 151 (H) 01/17/2023   CHOLHDL 2.6 01/17/2023   She has had pre diabetes with elevated A1c of 5.7 in 2012 and highest at 5.9 in 2016. She has not been working on diet and exercise for prediabetes/abnormal glucose, and denies hyperglycemia, hypoglycemia , nausea, polydipsia and polyuria. Last A1C in the office was:  Lab Results  Component Value Date   HGBA1C 6.1 (H) 01/17/2023   Last GFR: Lab Results  Component Value Date   EGFR 94 01/17/2023    Patient is on Vitamin D supplement, taking 5000 IU   Lab Results  Component Value Date   VD25OH 59 06/13/2022     She is on thyroid medication, levothyroxine 75 mcg QD. Her medication was not changed last visit.   Lab Results  Component Value Date   TSH 0.38 (L) 01/17/2023    Lab Results  Component Value Date   VITAMINB12 1,512 (H) 11/15/2020     Medication Review:  Current Outpatient Medications (Endocrine & Metabolic):    levothyroxine (SYNTHROID) 75 MCG tablet, TAKE 1 TAB DAILY  EXCEPT WEDNESDAYS TAKE 1/2 TAB ON EMPTY STOMACH WITH WATER FOR 30 MIN. NO ANTACID, MEDS, OR BIOTIN FOR 4 HOURS  Current Outpatient Medications  (Cardiovascular):    atorvastatin (LIPITOR) 40 MG tablet, Take 1 tablet   every other day (even days) for Cholesterol / Patient knows to take by mouth   bumetanide (BUMEX) 2 MG tablet, TAKE 1 TABLET BY MOUTH TWICE A DAY FOR BLOOD PRESSURE AND FLUID RETENTION  Current Outpatient Medications (Respiratory):    albuterol (PROAIR HFA) 108 (90 Base) MCG/ACT inhaler, Use 2 inhalations    15 minutes   every 4 hours      to rescue Asthma   cetirizine (ZYRTEC ALLERGY) 10 MG tablet, Take 1 tablet (10 mg total) by mouth daily.   fluticasone (FLONASE) 50 MCG/ACT nasal spray, Place 2 sprays into both nostrils daily.  Current Outpatient Medications (Analgesics):    acetaminophen (TYLENOL) 500 MG tablet, Take 500 mg by mouth every 6 (six) hours as needed.   Current Outpatient Medications (Other):    amphetamine-dextroamphetamine (ADDERALL) 30 MG tablet, Take 1 tablet by mouth 2 (two) times daily.   ARIPiprazole (ABILIFY) 2 MG tablet, Take 2 mg by mouth at  bedtime.   bimatoprost (LATISSE) 0.03 % ophthalmic solution, APPLY ONE DROP EVENLY ALONG THE SKIN OF THE UPPER EYELID AT BASE OF EYELASHES OF BOTH EYES AT BEDTIME   Cholecalciferol (VITAMIN D PO), Take 5,000 Units by mouth daily. 10,000   doxycycline (VIBRAMYCIN) 100 MG capsule, Take 1 capsule 2 x /day with meals for Infection   famotidine (PEPCID) 40 MG tablet, TAKE 1 TABLET BY MOUTH EVERYDAY AT BEDTIME   Flaxseed, Linseed, (FLAX SEED OIL) 1300 MG CAPS, Take 1,300 mg by mouth 3 (three) times daily.   hydroquinone 4 % cream, USE SMALL AMOUNT THREE TIMES A WEEK FOR DARK SPOTS   LORazepam (ATIVAN) 0.5 MG tablet, Take by mouth.   Magnesium 500 MG TABS, Take by mouth. Takes 4 tablets daily.   Multiple Vitamin (MULTIVITAMIN) tablet, Take 1 tablet by mouth daily.   mupirocin ointment (BACTROBAN) 2 %, Apply to Skin Infection 2 x /day  (Dx - L08.9)   Omega-3 Fatty Acids (FISH OIL) 1000 MG CAPS, Take 1 capsule (1,000 mg total) by mouth 3 (three) times daily.    pantoprazole (PROTONIX) 40 MG tablet, TAKE ONE TABLET BY MOUTH DAILY TO PREVENT INDIGESTION AND ACID REFLUX   potassium chloride SA (KLOR-CON M20) 20 MEQ tablet, Take  1 tablet  3 x /day  for Potassium deficiency                               /                        TAKE                                        BY                           MOUTH   traZODone (DESYREL) 50 MG tablet, Take 50-100 mg by mouth at bedtime.   valACYclovir (VALTREX) 500 MG tablet, Take 2 tablets 2 x /day at Onset of Symptoms   Vilazodone HCl (VIIBRYD) 40 MG TABS, Takes 1 tablet Daily per Dr Evelene Croon   Allergies  Allergen Reactions   Codeine Other (See Comments)    Abdominal pain   Dexamethasone Other (See Comments)    Sweating   Erythromycin Base     GI upset   Penicillins Rash    Current Problems (verified) Patient Active Problem List   Diagnosis Date Noted   Anemia 11/16/2020   Hair thinning 11/15/2020   Aortic atherosclerosis (HCC) by Abd CT scan 10.11.2021 07/12/2020   COPD (chronic obstructive pulmonary disease) (HCC) 07/01/2019   Abnormal glucose 05/21/2018   Attention deficit hyperactivity disorder (ADHD) 05/21/2018   Severe recurrent major depression without psychotic features (HCC) 09/17/2017    Class: Chronic   Extrinsic asthma 07/29/2015   Body mass index (BMI) of 23.0-23.9 in adult 07/29/2015   Smoker 04/14/2015   Gastroesophageal reflux disease 02/15/2015   Medication management 02/12/2014   Hypothyroidism    Essential hypertension 08/18/2013   Hyperlipidemia, mixed 08/18/2013   Vitamin D deficiency 08/18/2013   SURGICAL HISTORY She  has a past surgical history that includes Cholecystectomy (1975); Nasal sinus surgery (1996); Liposuction (1998); Breast surgery (Bilateral, 1983); Breast reconstruction (2008); Appendectomy; bil breast lumpectomy; bilateral breast masectomy (no breast cancer); and EUS (N/A,  04/23/2014). FAMILY HISTORY Her family history includes ADD / ADHD in her son; Alcohol  abuse in her father and paternal uncle; Anxiety disorder in her sister; COPD in her mother; Dementia in her mother; Depression in her mother and sister; Diabetes in her sister; Early death in her father and sister; Emphysema in her mother; Lung cancer in her mother; Schizophrenia in her sister. SOCIAL HISTORY She  reports that she has been smoking e-cigarettes and cigarettes. She started smoking about 54 years ago. She has a 41 pack-year smoking history. She has never used smokeless tobacco. She reports current alcohol use of about 4.0 - 5.0 standard drinks of alcohol per week. She reports that she does not use drugs.  Review of Systems  Constitutional:  Negative for malaise/fatigue and weight loss.  HENT:  Negative for hearing loss and tinnitus.   Eyes:  Negative for blurred vision and double vision.  Respiratory:  Negative for cough, shortness of breath and wheezing.   Cardiovascular:  Negative for chest pain, palpitations, orthopnea, claudication and leg swelling.  Gastrointestinal:  Negative for abdominal pain, blood in stool, constipation, diarrhea, heartburn, melena, nausea and vomiting.  Genitourinary: Negative.   Musculoskeletal:  Negative for joint pain and myalgias.  Skin:  Negative for rash.  Neurological:  Negative for dizziness, tingling, sensory change, weakness and headaches.  Endo/Heme/Allergies:  Negative for polydipsia.  Psychiatric/Behavioral:  Positive for depression. Negative for substance abuse and suicidal ideas. The patient is nervous/anxious.   All other systems reviewed and are negative.    Objective:     There were no vitals filed for this visit.   There is no height or weight on file to calculate BMI.  General appearance: alert, no distress, WD/WN, female HEENT: left nasal passage swelling with erythema, and scabing normocephalic, sclerae anicteric, TMs pearly, nares patent, no discharge or erythema, pharynx normal Oral cavity: MMM, no lesions Neck: supple,  no lymphadenopathy, no thyromegaly, no masses Heart: RRR, normal S1, S2, no murmurs Lungs: CTA bilaterally, no wheezes, rhonchi, or rales Abdomen: +bs, soft, non tender, non distended, no masses, no hepatomegaly, no splenomegaly Musculoskeletal: nontender, no swelling, no obvious deformity Extremities: no edema, no cyanosis, no clubbing Pulses: 2+ symmetric, upper and lower extremities, normal cap refill Neurological: alert, oriented x 3, CN2-12 intact, strength normal upper extremities and lower extremities, sensation normal throughout, DTRs 2+ throughout, no cerebellar signs, gait normal Psychiatric: normal affect, behavior normal, pleasant   Skin: warm/dry/intact without rashes EKG: NSR, no ST changes  [name], NP   05/15/2023

## 2023-05-16 ENCOUNTER — Encounter: Payer: Medicare Other | Admitting: Nurse Practitioner

## 2023-05-16 DIAGNOSIS — Z0001 Encounter for general adult medical examination with abnormal findings: Secondary | ICD-10-CM

## 2023-05-16 DIAGNOSIS — E782 Mixed hyperlipidemia: Secondary | ICD-10-CM

## 2023-05-16 DIAGNOSIS — E559 Vitamin D deficiency, unspecified: Secondary | ICD-10-CM

## 2023-05-16 DIAGNOSIS — R7309 Other abnormal glucose: Secondary | ICD-10-CM

## 2023-05-16 DIAGNOSIS — E079 Disorder of thyroid, unspecified: Secondary | ICD-10-CM

## 2023-05-16 DIAGNOSIS — K2100 Gastro-esophageal reflux disease with esophagitis, without bleeding: Secondary | ICD-10-CM

## 2023-05-16 DIAGNOSIS — Z79899 Other long term (current) drug therapy: Secondary | ICD-10-CM

## 2023-05-16 DIAGNOSIS — J45909 Unspecified asthma, uncomplicated: Secondary | ICD-10-CM

## 2023-05-16 DIAGNOSIS — F17200 Nicotine dependence, unspecified, uncomplicated: Secondary | ICD-10-CM

## 2023-05-16 DIAGNOSIS — F3341 Major depressive disorder, recurrent, in partial remission: Secondary | ICD-10-CM

## 2023-05-16 DIAGNOSIS — I700 Atherosclerosis of aorta: Secondary | ICD-10-CM

## 2023-05-16 DIAGNOSIS — I10 Essential (primary) hypertension: Secondary | ICD-10-CM

## 2023-05-16 DIAGNOSIS — J439 Emphysema, unspecified: Secondary | ICD-10-CM

## 2023-05-16 DIAGNOSIS — Z6823 Body mass index (BMI) 23.0-23.9, adult: Secondary | ICD-10-CM

## 2023-05-31 ENCOUNTER — Encounter: Payer: Medicare Other | Admitting: Nurse Practitioner

## 2023-06-07 ENCOUNTER — Encounter: Payer: Medicare Other | Admitting: Nurse Practitioner

## 2023-06-25 NOTE — Progress Notes (Deleted)
CPE  Assessment:    Essential hypertension Discussed dietary and exercise modifications -     CBC with Differential/Platelet -     COMPLETE METABOLIC PANEL WITH GFR  Aortic Atherosclerosis(HCC) on 02/06/22 CT Monitor BP, Weight, cholesterol and blood sugars  Emphysema on 02/06/22 CT(HCC)  Extrinsic asthma without complication, unspecified asthma severity, unspecified whether persistent Has rescue inhaler Continue Breo daily  STOP SMOKING  Gastroesophageal reflux disease, esophagitis presence not specified Well managed on current medications Discussed diet, avoiding triggers and other lifestyle changes  Hypothyroidism continue medications the same pending lab results reminded to take on an empty stomach 30-64mins before food.  -     TSH  Depression, major, recurrent, in partial remission (HCC) Taking Wellbutrin and lexapro Dr. Amie Portland managing; overdue follow up; encouraged to schedule  Hyperlipidemia, mixed Continue medications Continue low cholesterol diet and exercise.  Check lipid panel.  -     Lipid panel  Vitamin D deficiency -     VITAMIN D 25 Hydroxy (Vit-D Deficiency, Fractures)  Abnormal glucose Discussed dietary and exercise modifications -     Hemoglobin A1c  Smoker Discussed smoking cessation and resources Not ready to quit at this time Will continue to assess readiness  -lung cancer screening with low dose CT discussed as recommended by guidelines based on age, number of pack year history.  Discussed risks of screening including but not limited to false positives on xray, further testing or consultation with specialist, and possible false negative CT as well. Understanding expressed and wishes to proceed with CT testing. Order placed.   Body mass index (BMI) of 23.0-23.9 in adult Discussed dietary and exercise modifications  Medication management -     CBC with Differential/Platelet -     COMPLETE METABOLIC PANEL WITH GFR -      Magnesium    Over 40 minutes of exam, counseling, chart review and critical decision making was performed Future Appointments  Date Time Provider Department Center  06/26/2023 11:30 AM [name], NP [facility] None  01/09/2024  3:00 PM [name], NP [facility] None  06/25/2024 11:30 AM [name], NP [facility] None    Subjective:  Margaret Nelson is a 72 y.o. female who presents for 3 month follow up.   Patient is on [employer] disability for a long hx/o severe Depression circa 1990's w/hx/o ECT x 9 in 2008. She is seeing Dr Mauri Reading.  She was also at one time in a long term toxic relationship with a bipolar Manic Depressive- She is now living with her daughter and not in the relationship. Prescribed lexapro 10 mg, wellbutrin 150 mg TID, PRN Xanax.   Current some day smoker, hx of asthma, had emphysematous changes noted in lung bases per CT abd 06/2020. She has 30+ pack year history on review, smoking since she was 18, currently intermittently. Follow up CT Chest Nodule was ordered 01/17/23- NOT COMPLETED Ct chest lung cancer screening 02/06/22: 1. Lung-RADS 4A, suspicious. Follow up low-dose chest CT without contrast in 3 months (please use the following order, "CT CHEST LCS NODULE FOLLOW-UP W/O CM") is recommended. Alternatively, PET may be considered when there is a solid component 8mm or larger.   11.5 mm area of irregular nodular consolidation in the superior segment right lower lobe.   These results will be called to the ordering clinician or representative by the Radiologist Assistant, and communication documented in the PACS or [employer]. 2. Aortic atherosclerosis (ICD10-I70.0). Coronary artery calcification. 3.  Emphysema (ICD10-J43.9).  Had PET SCAN 03/10/22:  1. Pulmonary nodules do not demonstrate significant abnormal FDG avidity, as such these may represent benign nodules or a very low-grade primary bronchogenic adenocarcinoma,  particularly regarding the subsolid right apical pulmonary nodule. Suggest continued imaging surveillance. 2. Hypermetabolic asymmetric soft tissue thickening along the right glossotonsillar fold is nonspecific recommend correlation with direct visualization. 3. Small hiatal hernia with a focus hypermetabolic activity in the distal esophagus near the gastroesophageal junction which is nonspecific and may be physiologic. However, consider correlation with upper endoscopy to exclude underlying mass lesion.  BMI is There is no height or weight on file to calculate BMI., she is working on diet and exercise. Has lost weight coming off the elavil. Wt Readings from Last 3 Encounters:  01/29/23 136 lb (61.7 kg)  01/17/23 132 lb 6.4 oz (60.1 kg)  06/13/22 137 lb 12.8 oz (62.5 kg)   She has had elevated blood pressure since 2008. Her blood pressure has been controlled at home, today their BP is   She does not workout. She denies chest pain, shortness of breath, dizziness.   She has aortic atherosclerosis per CT 06/2020.   She is on cholesterol medication and denies myalgias. Her cholesterol is not at goal. The cholesterol last visit was:   Lab Results  Component Value Date   CHOL 150 01/17/2023   HDL 57 01/17/2023   LDLCALC 70 01/17/2023   TRIG 151 (H) 01/17/2023   CHOLHDL 2.6 01/17/2023   She has had pre diabetes with elevated A1c of 5.7 in 2012 and highest at 5.9 in 2016. She has not been working on diet and exercise for prediabetes/abnormal glucose, and denies hyperglycemia, hypoglycemia , nausea, polydipsia and polyuria. Last A1C in the office was:  Lab Results  Component Value Date   HGBA1C 6.1 (H) 01/17/2023   Last GFR: Lab Results  Component Value Date   GFRNONAA 90 11/15/2020   Patient is on Vitamin D supplement, taking 5000 IU   Lab Results  Component Value Date   VD25OH 32 06/13/2022     She is on thyroid medication. Her medication was not changed last visit.   Lab  Results  Component Value Date   TSH 0.38 (L) 01/17/2023  .    She reports taking 65 mg slow release iron recently due to hair loss concerns Lab Results  Component Value Date   IRON 108 11/15/2020   TIBC 329 11/15/2020   FERRITIN 51 11/15/2020   Lab Results  Component Value Date   WBC 9.4 01/17/2023   HGB 13.8 01/17/2023   HCT 41.6 01/17/2023   MCV 92.4 01/17/2023   PLT 400 01/17/2023    Lab Results  Component Value Date   VITAMINB12 1,512 (H) 11/15/2020     Medication Review:  Current Outpatient Medications (Endocrine & Metabolic):    levothyroxine (SYNTHROID) 75 MCG tablet, TAKE 1 TAB DAILY  EXCEPT WEDNESDAYS TAKE 1/2 TAB ON EMPTY STOMACH WITH WATER FOR 30 MIN. NO ANTACID, MEDS, OR BIOTIN FOR 4 HOURS  Current Outpatient Medications (Cardiovascular):    atorvastatin (LIPITOR) 40 MG tablet, Take 1 tablet   every other day (even days) for Cholesterol / Patient knows to take by mouth   bumetanide (BUMEX) 2 MG tablet, TAKE 1 TABLET BY MOUTH TWICE A DAY FOR BLOOD PRESSURE AND FLUID RETENTION  Current Outpatient Medications (Respiratory):    albuterol (PROAIR HFA) 108 (90 Base) MCG/ACT inhaler, Use 2 inhalations    15 minutes   every 4 hours  to rescue Asthma   cetirizine (ZYRTEC ALLERGY) 10 MG tablet, Take 1 tablet (10 mg total) by mouth daily.   fluticasone (FLONASE) 50 MCG/ACT nasal spray, Place 2 sprays into both nostrils daily.  Current Outpatient Medications (Analgesics):    acetaminophen (TYLENOL) 500 MG tablet, Take 500 mg by mouth every 6 (six) hours as needed.   Current Outpatient Medications (Other):    amphetamine-dextroamphetamine (ADDERALL) 30 MG tablet, Take 1 tablet by mouth 2 (two) times daily.   ARIPiprazole (ABILIFY) 2 MG tablet, Take 2 mg by mouth at bedtime.   bimatoprost (LATISSE) 0.03 % ophthalmic solution, APPLY ONE DROP EVENLY ALONG THE SKIN OF THE UPPER EYELID AT BASE OF EYELASHES OF BOTH EYES AT BEDTIME   Cholecalciferol (VITAMIN D PO), Take  5,000 Units by mouth daily. 10,000   doxycycline (VIBRAMYCIN) 100 MG capsule, Take 1 capsule 2 x /day with meals for Infection   famotidine (PEPCID) 40 MG tablet, TAKE 1 TABLET BY MOUTH EVERYDAY AT BEDTIME   Flaxseed, Linseed, (FLAX SEED OIL) 1300 MG CAPS, Take 1,300 mg by mouth 3 (three) times daily.   hydroquinone 4 % cream, USE SMALL AMOUNT THREE TIMES A WEEK FOR DARK SPOTS   LORazepam (ATIVAN) 0.5 MG tablet, Take by mouth.   Magnesium 500 MG TABS, Take by mouth. Takes 4 tablets daily.   Multiple Vitamin (MULTIVITAMIN) tablet, Take 1 tablet by mouth daily.   mupirocin ointment (BACTROBAN) 2 %, Apply to Skin Infection 2 x /day  (Dx - L08.9)   Omega-3 Fatty Acids (FISH OIL) 1000 MG CAPS, Take 1 capsule (1,000 mg total) by mouth 3 (three) times daily.   pantoprazole (PROTONIX) 40 MG tablet, TAKE ONE TABLET BY MOUTH DAILY TO PREVENT INDIGESTION AND ACID REFLUX   potassium chloride SA (KLOR-CON M20) 20 MEQ tablet, Take  1 tablet  3 x /day  for Potassium deficiency                               /                        TAKE                                        BY                           MOUTH   traZODone (DESYREL) 50 MG tablet, Take 50-100 mg by mouth at bedtime.   valACYclovir (VALTREX) 500 MG tablet, Take 2 tablets 2 x /day at Onset of Symptoms   Vilazodone HCl (VIIBRYD) 40 MG TABS, Takes 1 tablet Daily per Dr Evelene Croon   Allergies  Allergen Reactions   Codeine Other (See Comments)    Abdominal pain   Dexamethasone Other (See Comments)    Sweating   Erythromycin Base     GI upset   Penicillins Rash    Current Problems (verified) Patient Active Problem List   Diagnosis Date Noted   Anemia 11/16/2020   Hair thinning 11/15/2020   Aortic atherosclerosis (HCC) by Abd CT scan 10.11.2021 07/12/2020   COPD (chronic obstructive pulmonary disease) (HCC) 07/01/2019   Abnormal glucose 05/21/2018   Attention deficit hyperactivity disorder (ADHD) 05/21/2018   Severe recurrent major depression  without psychotic features (HCC) 09/17/2017  Class: Chronic   Extrinsic asthma 07/29/2015   Body mass index (BMI) of 23.0-23.9 in adult 07/29/2015   Smoker 04/14/2015   Gastroesophageal reflux disease 02/15/2015   Medication management 02/12/2014   Hypothyroidism    Essential hypertension 08/18/2013   Hyperlipidemia, mixed 08/18/2013   Vitamin D deficiency 08/18/2013   SURGICAL HISTORY She  has a past surgical history that includes Cholecystectomy (1975); Nasal sinus surgery (1996); Liposuction (1998); Breast surgery (Bilateral, 1983); Breast reconstruction (2008); Appendectomy; bil breast lumpectomy; bilateral breast masectomy (no breast cancer); and EUS (N/A, 04/23/2014). FAMILY HISTORY Her family history includes ADD / ADHD in her son; Alcohol abuse in her father and paternal uncle; Anxiety disorder in her sister; COPD in her mother; Dementia in her mother; Depression in her mother and sister; Diabetes in her sister; Early death in her father and sister; Emphysema in her mother; Lung cancer in her mother; Schizophrenia in her sister. SOCIAL HISTORY She  reports that she has been smoking e-cigarettes and cigarettes. She started smoking about 54 years ago. She has a 41.1 pack-year smoking history. She has never used smokeless tobacco. She reports current alcohol use of about 4.0 - 5.0 standard drinks of alcohol per week. She reports that she does not use drugs.  Review of Systems  Constitutional:  Negative for malaise/fatigue and weight loss.  HENT:  Negative for hearing loss and tinnitus.   Eyes:  Negative for blurred vision and double vision.  Respiratory:  Negative for cough, shortness of breath and wheezing.   Cardiovascular:  Negative for chest pain, palpitations, orthopnea, claudication and leg swelling.  Gastrointestinal:  Negative for abdominal pain, blood in stool, constipation, diarrhea, heartburn, melena, nausea and vomiting.  Genitourinary: Negative.   Musculoskeletal:   Negative for joint pain and myalgias.  Skin:  Negative for rash.  Neurological:  Negative for dizziness, tingling, sensory change, weakness and headaches.  Endo/Heme/Allergies:  Negative for polydipsia.  Psychiatric/Behavioral:  Positive for depression. Negative for substance abuse and suicidal ideas. The patient is nervous/anxious.   All other systems reviewed and are negative.    Objective:     There were no vitals filed for this visit.  There is no height or weight on file to calculate BMI.  General appearance: alert, no distress, WD/WN, female HEENT: left nasal passage swelling with erythema, and scabing normocephalic, sclerae anicteric, TMs pearly, nares patent, no discharge or erythema, pharynx normal Oral cavity: MMM, no lesions Neck: supple, no lymphadenopathy, no thyromegaly, no masses Heart: RRR, normal S1, S2, no murmurs Lungs: CTA bilaterally, no wheezes, rhonchi, or rales Abdomen: +bs, soft, non tender, non distended, no masses, no hepatomegaly, no splenomegaly Musculoskeletal: nontender, no swelling, no obvious deformity Extremities: no edema, no cyanosis, no clubbing Pulses: 2+ symmetric, upper and lower extremities, normal cap refill Neurological: alert, oriented x 3, CN2-12 intact, strength normal upper extremities and lower extremities, sensation normal throughout, DTRs 2+ throughout, no cerebellar signs, gait normal Psychiatric: normal affect, behavior normal, pleasant   Skin: warm/dry/intact without rashes; hair distribution with generalized thinning, no scalp abnormalities   [name], NP   06/25/2023

## 2023-06-26 ENCOUNTER — Encounter: Payer: Medicare Other | Admitting: Nurse Practitioner

## 2023-06-26 DIAGNOSIS — J45909 Unspecified asthma, uncomplicated: Secondary | ICD-10-CM

## 2023-06-26 DIAGNOSIS — F3341 Major depressive disorder, recurrent, in partial remission: Secondary | ICD-10-CM

## 2023-06-26 DIAGNOSIS — E782 Mixed hyperlipidemia: Secondary | ICD-10-CM

## 2023-06-26 DIAGNOSIS — F17200 Nicotine dependence, unspecified, uncomplicated: Secondary | ICD-10-CM

## 2023-06-26 DIAGNOSIS — I10 Essential (primary) hypertension: Secondary | ICD-10-CM

## 2023-06-26 DIAGNOSIS — Z6823 Body mass index (BMI) 23.0-23.9, adult: Secondary | ICD-10-CM

## 2023-06-26 DIAGNOSIS — E559 Vitamin D deficiency, unspecified: Secondary | ICD-10-CM

## 2023-06-26 DIAGNOSIS — I700 Atherosclerosis of aorta: Secondary | ICD-10-CM

## 2023-06-26 DIAGNOSIS — R7309 Other abnormal glucose: Secondary | ICD-10-CM

## 2023-06-26 DIAGNOSIS — J439 Emphysema, unspecified: Secondary | ICD-10-CM

## 2023-06-26 DIAGNOSIS — K2100 Gastro-esophageal reflux disease with esophagitis, without bleeding: Secondary | ICD-10-CM

## 2023-06-26 DIAGNOSIS — Z79899 Other long term (current) drug therapy: Secondary | ICD-10-CM

## 2023-07-02 DIAGNOSIS — Z23 Encounter for immunization: Secondary | ICD-10-CM | POA: Diagnosis not present

## 2023-07-10 NOTE — Progress Notes (Deleted)
CPE  Assessment:    Essential hypertension Discussed dietary and exercise modifications -     CBC with Differential/Platelet -     COMPLETE METABOLIC PANEL WITH GFR  Aortic Atherosclerosis(HCC) on 02/06/22 CT Monitor BP, Weight, cholesterol and blood sugars  Emphysema on 02/06/22 CT(HCC)  Extrinsic asthma without complication, unspecified asthma severity, unspecified whether persistent Has rescue inhaler Continue Breo daily  STOP SMOKING  Gastroesophageal reflux disease, esophagitis presence not specified Well managed on current medications Discussed diet, avoiding triggers and other lifestyle changes  Hypothyroidism continue medications the same pending lab results reminded to take on an empty stomach 30-32mins before food.  -     TSH  Depression, major, recurrent, in partial remission (HCC) Taking Wellbutrin and lexapro Dr. Amie Portland managing; overdue follow up; encouraged to schedule  Hyperlipidemia, mixed Continue medications Continue low cholesterol diet and exercise.  Check lipid panel.  -     Lipid panel  Vitamin D deficiency -     VITAMIN D 25 Hydroxy (Vit-D Deficiency, Fractures)  Abnormal glucose Discussed dietary and exercise modifications -     Hemoglobin A1c  Smoker Discussed smoking cessation and resources Not ready to quit at this time Will continue to assess readiness  -lung cancer screening with low dose CT discussed as recommended by guidelines based on age, number of pack year history.  Discussed risks of screening including but not limited to false positives on xray, further testing or consultation with specialist, and possible false negative CT as well. Understanding expressed and wishes to proceed with CT testing. Order placed.   Body mass index (BMI) of 23.0-23.9 in adult Discussed dietary and exercise modifications  Medication management -     CBC with Differential/Platelet -     COMPLETE METABOLIC PANEL WITH GFR -      Magnesium    Over 40 minutes of exam, counseling, chart review and critical decision making was performed Future Appointments  Date Time Provider Department Center  07/11/2023  2:00 PM [name], NP [facility] None  01/09/2024  3:00 PM [name], NP [facility] None  07/10/2024  2:00 PM [name], NP [facility] None    Subjective:  Margaret Nelson is a 72 y.o. female who presents for 3 month follow up.   Patient is on SS disability for a long hx/o severe Depression circa 1990's w/hx/o ECT x 9 in 2008. She is seeing Dr Mauri Reading.  She was also at one time in a long term toxic relationship with a bipolar Manic Depressive- She is now living with her daughter and not in the relationship. Prescribed lexapro 10 mg, wellbutrin 150 mg TID, PRN Xanax.   Current some day smoker, hx of asthma, had emphysematous changes noted in lung bases per CT abd 06/2020. She has 30+ pack year history on review, smoking since she was 18, currently intermittently. Follow up CT Chest Nodule was ordered 01/17/23- NOT COMPLETED Ct chest lung cancer screening 02/06/22: 1. Lung-RADS 4A, suspicious. Follow up low-dose chest CT without contrast in 3 months (please use the following order, "CT CHEST LCS NODULE FOLLOW-UP W/O CM") is recommended. Alternatively, PET may be considered when there is a solid component 8mm or larger.   11.5 mm area of irregular nodular consolidation in the superior segment right lower lobe.   These results will be called to the ordering clinician or representative by the Radiologist Assistant, and communication documented in the PACS or [employer]. 2. Aortic atherosclerosis (ICD10-I70.0). Coronary artery calcification. 3.  Emphysema (ICD10-J43.9).  Had PET SCAN 03/10/22:  1. Pulmonary nodules do not demonstrate significant abnormal FDG avidity, as such these may represent benign nodules or a very low-grade primary bronchogenic adenocarcinoma,  particularly regarding the subsolid right apical pulmonary nodule. Suggest continued imaging surveillance. 2. Hypermetabolic asymmetric soft tissue thickening along the right glossotonsillar fold is nonspecific recommend correlation with direct visualization. 3. Small hiatal hernia with a focus hypermetabolic activity in the distal esophagus near the gastroesophageal junction which is nonspecific and may be physiologic. However, consider correlation with upper endoscopy to exclude underlying mass lesion.  BMI is There is no height or weight on file to calculate BMI., she is working on diet and exercise. Has lost weight coming off the elavil. Wt Readings from Last 3 Encounters:  01/29/23 136 lb (61.7 kg)  01/17/23 132 lb 6.4 oz (60.1 kg)  06/13/22 137 lb 12.8 oz (62.5 kg)   She has had elevated blood pressure since 2008. Her blood pressure has been controlled at home, today their BP is   She does not workout. She denies chest pain, shortness of breath, dizziness.   She has aortic atherosclerosis per CT 06/2020.   She is on cholesterol medication and denies myalgias. Her cholesterol is not at goal. The cholesterol last visit was:   Lab Results  Component Value Date   CHOL 150 01/17/2023   HDL 57 01/17/2023   LDLCALC 70 01/17/2023   TRIG 151 (H) 01/17/2023   CHOLHDL 2.6 01/17/2023   She has had pre diabetes with elevated A1c of 5.7 in 2012 and highest at 5.9 in 2016. She has not been working on diet and exercise for prediabetes/abnormal glucose, and denies hyperglycemia, hypoglycemia , nausea, polydipsia and polyuria. Last A1C in the office was:  Lab Results  Component Value Date   HGBA1C 6.1 (H) 01/17/2023   Last GFR: Lab Results  Component Value Date   GFRNONAA 90 11/15/2020   Patient is on Vitamin D supplement, taking 5000 IU   Lab Results  Component Value Date   VD25OH 83 06/13/2022     She is on thyroid medication. Her medication was not changed last visit.   Lab  Results  Component Value Date   TSH 0.38 (L) 01/17/2023  .    She reports taking 65 mg slow release iron recently due to hair loss concerns Lab Results  Component Value Date   IRON 108 11/15/2020   TIBC 329 11/15/2020   FERRITIN 51 11/15/2020   Lab Results  Component Value Date   WBC 9.4 01/17/2023   HGB 13.8 01/17/2023   HCT 41.6 01/17/2023   MCV 92.4 01/17/2023   PLT 400 01/17/2023    Lab Results  Component Value Date   VITAMINB12 1,512 (H) 11/15/2020     Medication Review:  Current Outpatient Medications (Endocrine & Metabolic):    levothyroxine (SYNTHROID) 75 MCG tablet, TAKE 1 TAB DAILY  EXCEPT WEDNESDAYS TAKE 1/2 TAB ON EMPTY STOMACH WITH WATER FOR 30 MIN. NO ANTACID, MEDS, OR BIOTIN FOR 4 HOURS  Current Outpatient Medications (Cardiovascular):    atorvastatin (LIPITOR) 40 MG tablet, Take 1 tablet   every other day (even days) for Cholesterol / Patient knows to take by mouth   bumetanide (BUMEX) 2 MG tablet, TAKE 1 TABLET BY MOUTH TWICE A DAY FOR BLOOD PRESSURE AND FLUID RETENTION  Current Outpatient Medications (Respiratory):    albuterol (PROAIR HFA) 108 (90 Base) MCG/ACT inhaler, Use 2 inhalations    15 minutes   every 4  hours      to rescue Asthma   cetirizine (ZYRTEC ALLERGY) 10 MG tablet, Take 1 tablet (10 mg total) by mouth daily.   fluticasone (FLONASE) 50 MCG/ACT nasal spray, Place 2 sprays into both nostrils daily.  Current Outpatient Medications (Analgesics):    acetaminophen (TYLENOL) 500 MG tablet, Take 500 mg by mouth every 6 (six) hours as needed.   Current Outpatient Medications (Other):    amphetamine-dextroamphetamine (ADDERALL) 30 MG tablet, Take 1 tablet by mouth 2 (two) times daily.   ARIPiprazole (ABILIFY) 2 MG tablet, Take 2 mg by mouth at bedtime.   bimatoprost (LATISSE) 0.03 % ophthalmic solution, APPLY ONE DROP EVENLY ALONG THE SKIN OF THE UPPER EYELID AT BASE OF EYELASHES OF BOTH EYES AT BEDTIME   Cholecalciferol (VITAMIN D PO), Take  5,000 Units by mouth daily. 10,000   doxycycline (VIBRAMYCIN) 100 MG capsule, Take 1 capsule 2 x /day with meals for Infection   famotidine (PEPCID) 40 MG tablet, TAKE 1 TABLET BY MOUTH EVERYDAY AT BEDTIME   Flaxseed, Linseed, (FLAX SEED OIL) 1300 MG CAPS, Take 1,300 mg by mouth 3 (three) times daily.   hydroquinone 4 % cream, USE SMALL AMOUNT THREE TIMES A WEEK FOR DARK SPOTS   LORazepam (ATIVAN) 0.5 MG tablet, Take by mouth.   Magnesium 500 MG TABS, Take by mouth. Takes 4 tablets daily.   Multiple Vitamin (MULTIVITAMIN) tablet, Take 1 tablet by mouth daily.   mupirocin ointment (BACTROBAN) 2 %, Apply to Skin Infection 2 x /day  (Dx - L08.9)   Omega-3 Fatty Acids (FISH OIL) 1000 MG CAPS, Take 1 capsule (1,000 mg total) by mouth 3 (three) times daily.   pantoprazole (PROTONIX) 40 MG tablet, TAKE ONE TABLET BY MOUTH DAILY TO PREVENT INDIGESTION AND ACID REFLUX   potassium chloride SA (KLOR-CON M20) 20 MEQ tablet, Take  1 tablet  3 x /day  for Potassium deficiency                               /                        TAKE                                        BY                           MOUTH   traZODone (DESYREL) 50 MG tablet, Take 50-100 mg by mouth at bedtime.   valACYclovir (VALTREX) 500 MG tablet, Take 2 tablets 2 x /day at Onset of Symptoms   Vilazodone HCl (VIIBRYD) 40 MG TABS, Takes 1 tablet Daily per Dr Evelene Croon   Allergies  Allergen Reactions   Codeine Other (See Comments)    Abdominal pain   Dexamethasone Other (See Comments)    Sweating   Erythromycin Base     GI upset   Penicillins Rash    Current Problems (verified) Patient Active Problem List   Diagnosis Date Noted   Anemia 11/16/2020   Hair thinning 11/15/2020   Aortic atherosclerosis (HCC) by Abd CT scan 10.11.2021 07/12/2020   COPD (chronic obstructive pulmonary disease) (HCC) 07/01/2019   Abnormal glucose 05/21/2018   Attention deficit hyperactivity disorder (ADHD) 05/21/2018   Severe recurrent major depression  without psychotic features (HCC) 09/17/2017    Class: Chronic   Extrinsic asthma 07/29/2015   Body mass index (BMI) of 23.0-23.9 in adult 07/29/2015   Smoker 04/14/2015   Gastroesophageal reflux disease 02/15/2015   Medication management 02/12/2014   Hypothyroidism    Essential hypertension 08/18/2013   Hyperlipidemia, mixed 08/18/2013   Vitamin D deficiency 08/18/2013   SURGICAL HISTORY She  has a past surgical history that includes Cholecystectomy (1975); Nasal sinus surgery (1996); Liposuction (1998); Breast surgery (Bilateral, 1983); Breast reconstruction (2008); Appendectomy; bil breast lumpectomy; bilateral breast masectomy (no breast cancer); and EUS (N/A, 04/23/2014). FAMILY HISTORY Her family history includes ADD / ADHD in her son; Alcohol abuse in her father and paternal uncle; Anxiety disorder in her sister; COPD in her mother; Dementia in her mother; Depression in her mother and sister; Diabetes in her sister; Early death in her father and sister; Emphysema in her mother; Lung cancer in her mother; Schizophrenia in her sister. SOCIAL HISTORY She  reports that she has been smoking e-cigarettes and cigarettes. She started smoking about 54 years ago. She has a 41.1 pack-year smoking history. She has never used smokeless tobacco. She reports current alcohol use of about 4.0 - 5.0 standard drinks of alcohol per week. She reports that she does not use drugs.  Review of Systems  Constitutional:  Negative for malaise/fatigue and weight loss.  HENT:  Negative for ear discharge, hearing loss and tinnitus.   Eyes:  Negative for blurred vision and double vision.  Respiratory:  Negative for cough, shortness of breath and wheezing.   Cardiovascular:  Negative for chest pain, palpitations, orthopnea, claudication and leg swelling.  Gastrointestinal:  Negative for abdominal pain, blood in stool, constipation, diarrhea, heartburn, melena, nausea and vomiting.  Genitourinary: Negative.    Musculoskeletal:  Negative for joint pain and myalgias.  Skin:  Negative for rash.  Neurological:  Negative for dizziness, tingling, sensory change, weakness and headaches.  Endo/Heme/Allergies:  Negative for polydipsia.  Psychiatric/Behavioral:  Positive for depression. Negative for substance abuse and suicidal ideas. The patient is nervous/anxious.   All other systems reviewed and are negative.    Objective:     There were no vitals filed for this visit.  There is no height or weight on file to calculate BMI.  General appearance: alert, no distress, WD/WN, female HEENT: left nasal passage swelling with erythema, and scabing normocephalic, sclerae anicteric, TMs pearly, nares patent, no discharge or erythema, pharynx normal Oral cavity: MMM, no lesions Neck: supple, no lymphadenopathy, no thyromegaly, no masses Heart: RRR, normal S1, S2, no murmurs Lungs: CTA bilaterally, no wheezes, rhonchi, or rales Abdomen: +bs, soft, non tender, non distended, no masses, no hepatomegaly, no splenomegaly Musculoskeletal: nontender, no swelling, no obvious deformity Extremities: no edema, no cyanosis, no clubbing Pulses: 2+ symmetric, upper and lower extremities, normal cap refill Neurological: alert, oriented x 3, CN2-12 intact, strength normal upper extremities and lower extremities, sensation normal throughout, DTRs 2+ throughout, no cerebellar signs, gait normal Psychiatric: normal affect, behavior normal, pleasant   Skin: warm/dry/intact without rashes; hair distribution with generalized thinning, no scalp abnormalities   [name], NP   07/10/2023

## 2023-07-11 ENCOUNTER — Encounter: Payer: Medicare Other | Admitting: Nurse Practitioner

## 2023-07-11 DIAGNOSIS — K2100 Gastro-esophageal reflux disease with esophagitis, without bleeding: Secondary | ICD-10-CM

## 2023-07-11 DIAGNOSIS — E559 Vitamin D deficiency, unspecified: Secondary | ICD-10-CM

## 2023-07-11 DIAGNOSIS — I10 Essential (primary) hypertension: Secondary | ICD-10-CM

## 2023-07-11 DIAGNOSIS — F17200 Nicotine dependence, unspecified, uncomplicated: Secondary | ICD-10-CM

## 2023-07-11 DIAGNOSIS — R7309 Other abnormal glucose: Secondary | ICD-10-CM

## 2023-07-11 DIAGNOSIS — F3341 Major depressive disorder, recurrent, in partial remission: Secondary | ICD-10-CM

## 2023-07-11 DIAGNOSIS — J439 Emphysema, unspecified: Secondary | ICD-10-CM

## 2023-07-11 DIAGNOSIS — Z79899 Other long term (current) drug therapy: Secondary | ICD-10-CM

## 2023-07-11 DIAGNOSIS — I700 Atherosclerosis of aorta: Secondary | ICD-10-CM

## 2023-07-11 DIAGNOSIS — E782 Mixed hyperlipidemia: Secondary | ICD-10-CM

## 2023-07-11 DIAGNOSIS — Z0001 Encounter for general adult medical examination with abnormal findings: Secondary | ICD-10-CM

## 2023-07-16 ENCOUNTER — Ambulatory Visit (INDEPENDENT_AMBULATORY_CARE_PROVIDER_SITE_OTHER): Payer: Medicare Other | Admitting: Nurse Practitioner

## 2023-07-16 ENCOUNTER — Encounter: Payer: Self-pay | Admitting: Nurse Practitioner

## 2023-07-16 VITALS — BP 104/62 | HR 99 | Temp 97.9°F | Ht 62.5 in | Wt 126.0 lb

## 2023-07-16 DIAGNOSIS — Z136 Encounter for screening for cardiovascular disorders: Secondary | ICD-10-CM | POA: Diagnosis not present

## 2023-07-16 DIAGNOSIS — F17200 Nicotine dependence, unspecified, uncomplicated: Secondary | ICD-10-CM

## 2023-07-16 DIAGNOSIS — R7309 Other abnormal glucose: Secondary | ICD-10-CM

## 2023-07-16 DIAGNOSIS — I10 Essential (primary) hypertension: Secondary | ICD-10-CM

## 2023-07-16 DIAGNOSIS — M62830 Muscle spasm of back: Secondary | ICD-10-CM

## 2023-07-16 DIAGNOSIS — F3341 Major depressive disorder, recurrent, in partial remission: Secondary | ICD-10-CM

## 2023-07-16 DIAGNOSIS — K2100 Gastro-esophageal reflux disease with esophagitis, without bleeding: Secondary | ICD-10-CM | POA: Diagnosis not present

## 2023-07-16 DIAGNOSIS — E782 Mixed hyperlipidemia: Secondary | ICD-10-CM | POA: Diagnosis not present

## 2023-07-16 DIAGNOSIS — Z6823 Body mass index (BMI) 23.0-23.9, adult: Secondary | ICD-10-CM | POA: Diagnosis not present

## 2023-07-16 DIAGNOSIS — H6122 Impacted cerumen, left ear: Secondary | ICD-10-CM | POA: Diagnosis not present

## 2023-07-16 DIAGNOSIS — Z79899 Other long term (current) drug therapy: Secondary | ICD-10-CM | POA: Diagnosis not present

## 2023-07-16 DIAGNOSIS — E559 Vitamin D deficiency, unspecified: Secondary | ICD-10-CM

## 2023-07-16 DIAGNOSIS — R918 Other nonspecific abnormal finding of lung field: Secondary | ICD-10-CM | POA: Diagnosis not present

## 2023-07-16 DIAGNOSIS — J40 Bronchitis, not specified as acute or chronic: Secondary | ICD-10-CM | POA: Diagnosis not present

## 2023-07-16 DIAGNOSIS — Z0001 Encounter for general adult medical examination with abnormal findings: Secondary | ICD-10-CM

## 2023-07-16 DIAGNOSIS — J439 Emphysema, unspecified: Secondary | ICD-10-CM

## 2023-07-16 DIAGNOSIS — Z6822 Body mass index (BMI) 22.0-22.9, adult: Secondary | ICD-10-CM

## 2023-07-16 DIAGNOSIS — R21 Rash and other nonspecific skin eruption: Secondary | ICD-10-CM

## 2023-07-16 DIAGNOSIS — I700 Atherosclerosis of aorta: Secondary | ICD-10-CM

## 2023-07-16 MED ORDER — AZITHROMYCIN 250 MG PO TABS: ORAL_TABLET | ORAL | 1 refills | Status: DC

## 2023-07-16 MED ORDER — CLOTRIMAZOLE-BETAMETHASONE 1-0.05 % EX CREA: 1.0000 | TOPICAL_CREAM | Freq: Two times a day (BID) | CUTANEOUS | 2 refills | Status: AC

## 2023-07-16 MED ORDER — ALBUTEROL SULFATE HFA 108 (90 BASE) MCG/ACT IN AERS
INHALATION_SPRAY | RESPIRATORY_TRACT | 2 refills | Status: AC
Start: 2023-07-16 — End: ?

## 2023-07-16 NOTE — Patient Instructions (Signed)
Muscle Cramps and Spasms Muscle cramps and spasms happen when muscles tighten on their own. They can be in any muscle. They happen most often in the muscles in the back of your lower leg (calf). Muscle cramps are painful. They are often stronger and last longer than muscle spasms. Muscle spasms may or may not be painful. In many cases, the cause of a muscle cramp or spasm is not known. But it may be from: Doing more work or exercise than your body is ready for. Using the muscles too much (overuse). Staying in one position for too long. Not preparing enough or having bad form when you play a sport or do an activity. Getting hurt. Other causes may include: Not enough water in your body (dehydration). Taking certain medicines. Not having enough salts and minerals in the body (electrolytes). Follow these instructions at home: Eating and drinking Drink enough fluid to keep your pee (urine) pale yellow. Eat a healthy diet to help your muscles work well. Your diet should include: Fruits and vegetables. Lean protein. Whole grains. Low-fat or nonfat dairy products. Managing pain and stiffness     Massage, stretch, and relax the muscle. Do this for a few minutes at a time. If told, put ice on the muscle. This may help if you are sore or have pain after a cramp or spasm. Put ice in a plastic bag. Place a towel between your skin and the bag. Leave the ice on for 20 minutes, 2-3 times a day. If told, put heat on tight or tense muscles. Do this as often as told by your doctor. Use the heat source that your doctor recommends, such as a moist heat pack or a heating pad. Place a towel between your skin and the heat source. Leave the heat on for 20-30 minutes. If your skin turns bright red, take off the ice or heat right away to prevent skin damage. The risk of damage is higher if you cannot feel pain, heat, or cold. Take hot showers or baths to help relax the muscles. General instructions If you  are having cramps often, avoid intense exercise for a few days. Take over-the-counter and prescription medicines only as told by your doctor. Watch for any changes in your symptoms. Contact a doctor if: Your cramps or spasms get worse or happen more often. Your cramps or spasms do not get better with time. This information is not intended to replace advice given to you by your health care provider. Make sure you discuss any questions you have with your health care provider. Document Revised: 05/02/2022 Document Reviewed: 05/02/2022 Elsevier Patient Education  2024 [employer].

## 2023-07-16 NOTE — Progress Notes (Signed)
CPE  Assessment:    Essential hypertension Discussed dietary and exercise modifications -     CBC with Differential/Platelet -     COMPLETE METABOLIC PANEL WITH GFR - EKG - Routine UA with reflex microscopic - Microalbumin/creatinine urine ratio  Aortic Atherosclerosis(HCC) on 02/06/22 and 07/05/20 CT Monitor BP, Weight, cholesterol and blood sugars  Emphysema on 02/06/22 and 07/05/20 CT(HCC) Monitor symptoms Use Albuterol inhaler as needed.   Extrinsic asthma without complication, unspecified asthma severity, unspecified whether persistent Has rescue inhaler- albuterol to use PRN STOP SMOKING  Gastroesophageal reflux disease, esophagitis presence not specified Well managed on current medications Discussed diet, avoiding triggers and other lifestyle changes  Hypothyroidism continue medications the same pending lab results reminded to take on an empty stomach 30-75mins before food.  -     TSH  Depression, major, recurrent, in partial remission (HCC) Taking Wellbutrin and lexapro Continue to follow with Dr. Evelene Croon  Hyperlipidemia, mixed Continue medications Continue low cholesterol diet and exercise.  Check lipid panel.  -     Lipid panel  Vitamin D deficiency -     VITAMIN D 25 Hydroxy (Vit-D Deficiency, Fractures)  Abnormal glucose Discussed dietary and exercise modifications -     Hemoglobin A1c  Smoker Discussed smoking cessation and resources Not ready to quit at this time Will continue to assess readiness  -lung cancer screening with low dose CT discussed as recommended by guidelines based on age, number of pack year history.  Discussed risks of screening including but not limited to false positives on xray, further testing or consultation with specialist, and possible false negative CT as well. Understanding expressed and wishes to proceed with CT testing. Order placed 12/2022  Body mass index (BMI) of 22.0-2.29 in adult Discussed dietary and exercise  modifications  Lumbar pain/muscle spasm Advised to use heat and tylenol  Medication management -     CBC with Differential/Platelet -     COMPLETE METABOLIC PANEL WITH GFR -     Magnesium -     Lipid panel -     TSH -     Hemoglobin A1C w/out eAG -     VITAMIN D 25 Hydroxy (Vit-D Deficiency, Fractures) -     EKG 12-Lead -     Urinalysis, Routine w reflex microscopic -     Microalbumin / creatinine urine ratio  Pulmonary nodules Reminded she is due for repeat CT- order is still active, states she will call to schedule  Skin rash Monitor rash, if does not resolve with treatment notify the office -     clotrimazole-betamethasone (LOTRISONE) cream; Apply 1 Application topically 2 (two) times daily. -     azithromycin (ZITHROMAX) 250 MG tablet; Take 2 tablets (500 mg) on  Day 1,  followed by 1 tablet (250 mg) once daily on Days 2 through 5.  Impacted cerumen of left ear Ear Lavage performed of left ear   Over 40 minutes of exam, counseling, chart review and critical decision making was performed Future Appointments  Date Time Provider Department Center  01/09/2024  3:00 PM [name], NP [facility] None  07/15/2024  2:00 PM [name], NP [facility] None    Subjective:  Margaret Nelson is a 72 y.o. female who presents for 3 month follow up.    Last night was picking up heavy items during yard work and started having low back pain.  She has not tried any over the counter medications.  Describes the pain as 2/10  with movement.   Patient is on [employer] disability for a long hx/o severe Depression circa 1990's w/hx/o ECT x 9 in 2008. She is seeing Dr Evelene Croon.  She was also at one time in a long term toxic relationship with a bipolar Manic Depressive- She is now living with her daughter and not in the relationship. Prescribed abilify 2 mg, Adderall 30 mg BID, vilazodone 40 mg every day  PRN Ativan 0.5 mg.   Current some day smoker, hx of asthma, had emphysematous changes noted in  lung bases per CT abd 06/2020. She has 30+ pack year history on review, smoking since she was 18, currently 1 ppd. Occasional shortness of breath with exertion. Follow up CT Chest Nodule was ordered 01/17/23- NOT COMPLETED Ct chest lung cancer screening 02/06/22: 1. Lung-RADS 4A, suspicious. Follow up low-dose chest CT without contrast in 3 months (please use the following order, "CT CHEST LCS NODULE FOLLOW-UP W/O CM") is recommended. Alternatively, PET may be considered when there is a solid component 8mm or larger.   11.5 mm area of irregular nodular consolidation in the superior segment right lower lobe.   These results will be called to the ordering clinician or representative by the Radiologist Assistant, and communication documented in the PACS or [employer]. 2. Aortic atherosclerosis (ICD10-I70.0). Coronary artery calcification. 3.  Emphysema (ICD10-J43.9).  Had PET SCAN 03/10/22:  1. Pulmonary nodules do not demonstrate significant abnormal FDG avidity, as such these may represent benign nodules or a very low-grade primary bronchogenic adenocarcinoma, particularly regarding the subsolid right apical pulmonary nodule. Suggest continued imaging surveillance. 2. Hypermetabolic asymmetric soft tissue thickening along the right glossotonsillar fold is nonspecific recommend correlation with direct visualization. 3. Small hiatal hernia with a focus hypermetabolic activity in the distal esophagus near the gastroesophageal junction which is nonspecific and may be physiologic. However, consider correlation with upper endoscopy to exclude underlying mass lesion.  BMI is Body mass index is 22.68 kg/m., she is working on diet and exercise. She has lost 10 pounds, states she is eating healthily- not as much protein Wt Readings from Last 3 Encounters:  07/16/23 126 lb (57.2 kg)  01/29/23 136 lb (61.7 kg)  01/17/23 132 lb 6.4 oz (60.1 kg)   She has had elevated blood pressure since  2008. Her blood pressure has been controlled at home, today their BP is BP: 104/62 . She does use Bumetanide 2 mg BID for edema of feet and hands.  BP Readings from Last 3 Encounters:  07/16/23 104/62  01/29/23 124/78  01/17/23 100/62  She does not workout. She denies chest pain, headaches, dizziness.    She has aortic atherosclerosis and emphysema per CT 06/2020.   She is on cholesterol medication , atorvastatin 40 mg every day and denies myalgias. Her cholesterol is not at goal. The cholesterol last visit was:   Lab Results  Component Value Date   CHOL 150 01/17/2023   HDL 57 01/17/2023   LDLCALC 70 01/17/2023   TRIG 151 (H) 01/17/2023   CHOLHDL 2.6 01/17/2023   She has had pre diabetes with elevated A1c of 5.7 in 2012 and highest at 5.9 in 2016. She has not been working on diet and exercise for prediabetes/abnormal glucose, and denies hyperglycemia, hypoglycemia , nausea, polydipsia and polyuria. Last A1C in the office was:  Lab Results  Component Value Date   HGBA1C 6.1 (H) 01/17/2023   Last GFR: Lab Results  Component Value Date   EGFR 94 01/17/2023    Patient is  on Vitamin D supplement, taking 5000 IU   Lab Results  Component Value Date   VD25OH 50 06/13/2022     She is on thyroid medication, levothyroxine 75 mcg 1 tab daily except Wednesday 1 1/2 tabs. Her medication was not changed last visit.   Lab Results  Component Value Date   TSH 0.38 (L) 01/17/2023  .  She does take Pantoprazole 40 mg every day and famotidine 40 mg every day.  Does control GERD symptoms     Medication Review:  Current Outpatient Medications (Endocrine & Metabolic):    levothyroxine (SYNTHROID) 75 MCG tablet, TAKE 1 TAB DAILY  EXCEPT WEDNESDAYS TAKE 1/2 TAB ON EMPTY STOMACH WITH WATER FOR 30 MIN. NO ANTACID, MEDS, OR BIOTIN FOR 4 HOURS  Current Outpatient Medications (Cardiovascular):    atorvastatin (LIPITOR) 40 MG tablet, Take 1 tablet   every other day (even days) for Cholesterol /  Patient knows to take by mouth   bumetanide (BUMEX) 2 MG tablet, TAKE 1 TABLET BY MOUTH TWICE A DAY FOR BLOOD PRESSURE AND FLUID RETENTION  Current Outpatient Medications (Respiratory):    albuterol (PROAIR HFA) 108 (90 Base) MCG/ACT inhaler, Use 2 inhalations    15 minutes   every 4 hours      to rescue Asthma   cetirizine (ZYRTEC ALLERGY) 10 MG tablet, Take 1 tablet (10 mg total) by mouth daily.   fluticasone (FLONASE) 50 MCG/ACT nasal spray, Place 2 sprays into both nostrils daily.  Current Outpatient Medications (Analgesics):    acetaminophen (TYLENOL) 500 MG tablet, Take 500 mg by mouth every 6 (six) hours as needed.   Current Outpatient Medications (Other):    amphetamine-dextroamphetamine (ADDERALL) 30 MG tablet, Take 1 tablet by mouth 2 (two) times daily.   ARIPiprazole (ABILIFY) 2 MG tablet, Take 2 mg by mouth at bedtime.   bimatoprost (LATISSE) 0.03 % ophthalmic solution, APPLY ONE DROP EVENLY ALONG THE SKIN OF THE UPPER EYELID AT BASE OF EYELASHES OF BOTH EYES AT BEDTIME   Cholecalciferol (VITAMIN D PO), Take 5,000 Units by mouth daily. 10,000   famotidine (PEPCID) 40 MG tablet, TAKE 1 TABLET BY MOUTH EVERYDAY AT BEDTIME   Flaxseed, Linseed, (FLAX SEED OIL) 1300 MG CAPS, Take 1,300 mg by mouth 3 (three) times daily.   hydroquinone 4 % cream, USE SMALL AMOUNT THREE TIMES A WEEK FOR DARK SPOTS   LORazepam (ATIVAN) 0.5 MG tablet, Take by mouth.   Magnesium 500 MG TABS, Take by mouth. Takes 4 tablets daily.   Multiple Vitamin (MULTIVITAMIN) tablet, Take 1 tablet by mouth daily.   mupirocin ointment (BACTROBAN) 2 %, Apply to Skin Infection 2 x /day  (Dx - L08.9)   Omega-3 Fatty Acids (FISH OIL) 1000 MG CAPS, Take 1 capsule (1,000 mg total) by mouth 3 (three) times daily.   pantoprazole (PROTONIX) 40 MG tablet, TAKE ONE TABLET BY MOUTH DAILY TO PREVENT INDIGESTION AND ACID REFLUX   potassium chloride SA (KLOR-CON M20) 20 MEQ tablet, Take  1 tablet  3 x /day  for Potassium deficiency                                /                        TAKE  BY                           MOUTH   traZODone (DESYREL) 50 MG tablet, Take 50-100 mg by mouth at bedtime.   Vilazodone HCl (VIIBRYD) 40 MG TABS, Takes 1 tablet Daily per Dr Evelene Croon   doxycycline (VIBRAMYCIN) 100 MG capsule, Take 1 capsule 2 x /day with meals for Infection (Patient not taking: Reported on 07/16/2023)   valACYclovir (VALTREX) 500 MG tablet, Take 2 tablets 2 x /day at Onset of Symptoms (Patient not taking: Reported on 07/16/2023)   Allergies  Allergen Reactions   Codeine Other (See Comments)    Abdominal pain   Dexamethasone Other (See Comments)    Sweating   Erythromycin Base     GI upset   Penicillins Rash    Current Problems (verified) Patient Active Problem List   Diagnosis Date Noted   Anemia 11/16/2020   Hair thinning 11/15/2020   Aortic atherosclerosis (HCC) by Abd CT scan 10.11.2021 07/12/2020   COPD (chronic obstructive pulmonary disease) (HCC) 07/01/2019   Abnormal glucose 05/21/2018   Attention deficit hyperactivity disorder (ADHD) 05/21/2018   Severe recurrent major depression without psychotic features (HCC) 09/17/2017    Class: Chronic   Extrinsic asthma 07/29/2015   Body mass index (BMI) of 23.0-23.9 in adult 07/29/2015   Smoker 04/14/2015   Gastroesophageal reflux disease 02/15/2015   Medication management 02/12/2014   Hypothyroidism    Essential hypertension 08/18/2013   Hyperlipidemia, mixed 08/18/2013   Vitamin D deficiency 08/18/2013   SURGICAL HISTORY She  has a past surgical history that includes Cholecystectomy (1975); Nasal sinus surgery (1996); Liposuction (1998); Breast surgery (Bilateral, 1983); Breast reconstruction (2008); Appendectomy; bil breast lumpectomy; bilateral breast masectomy (no breast cancer); and EUS (N/A, 04/23/2014). FAMILY HISTORY Her family history includes ADD / ADHD in her son; Alcohol abuse in her father and paternal  uncle; Anxiety disorder in her sister; COPD in her mother; Dementia in her mother; Depression in her mother and sister; Diabetes in her sister; Early death in her father and sister; Emphysema in her mother; Lung cancer in her mother; Schizophrenia in her sister. SOCIAL HISTORY She  reports that she has been smoking e-cigarettes and cigarettes. She started smoking about 54 years ago. She has a 41.1 pack-year smoking history. She has never used smokeless tobacco. She reports current alcohol use of about 4.0 - 5.0 standard drinks of alcohol per week. She reports that she does not use drugs.  Review of Systems  Constitutional:  Negative for malaise/fatigue and weight loss.  HENT:  Negative for ear discharge, hearing loss and tinnitus.        Difficulty hearing out of left ear   Eyes:  Negative for blurred vision and double vision.  Respiratory:  Negative for cough, shortness of breath and wheezing.   Cardiovascular:  Negative for chest pain, palpitations, orthopnea, claudication and leg swelling.  Gastrointestinal:  Negative for abdominal pain, blood in stool, constipation, diarrhea, heartburn, melena, nausea and vomiting.  Genitourinary: Negative.   Musculoskeletal:  Positive for back pain (low back on left). Negative for joint pain and myalgias.  Skin:  Positive for rash (on feet, itchy).  Neurological:  Negative for dizziness, tingling, sensory change, weakness and headaches.  Endo/Heme/Allergies:  Negative for polydipsia.  Psychiatric/Behavioral:  Positive for depression. Negative for substance abuse and suicidal ideas. The patient is nervous/anxious.   All other systems reviewed and are negative.    Objective:  Today's Vitals   07/16/23 1352  BP: 104/62  Pulse: 99  Temp: 97.9 F (36.6 C)  SpO2: 96%  Weight: 126 lb (57.2 kg)  Height: 5' 2.5" (1.588 m)    Body mass index is 22.68 kg/m.  General appearance: alert, no distress, WD/WN, female HEENT: left nasal passage swelling  with erythema, and scabing normocephalic, sclerae anicteric, R TM pearly, nares patent, no discharge or erythema. Left TM cerumen impaction- wax removed and TM normal, pharynx normal Oral cavity: MMM, no lesions Neck: supple, no lymphadenopathy, no thyromegaly, no masses Heart: RRR, normal S1, S2, no murmurs Lungs: CTA bilaterally, no wheezes, rhonchi, or rales Abdomen: +bs, soft, non tender, non distended, no masses, no hepatomegaly, no splenomegaly Musculoskeletal: nontender, no swelling, left lumbar muscle spasm noted. Extremities: no edema, no cyanosis, no clubbing Pulses: 2+ symmetric, upper and lower extremities, normal cap refill Neurological: alert, oriented x 3, CN2-12 intact, strength normal upper extremities and lower extremities, sensation normal throughout, DTRs 2+ throughout, no cerebellar signs, gait normal Psychiatric: normal affect, behavior normal, pleasant   Skin: warm/dry/intact without rashes; hair distribution with generalized thinning, no scalp abnormalities. Scaly lesions on lateral side of feet bilaterally  EKG: NSR, no ST changes  [name], NP   07/16/2023

## 2023-07-17 LAB — CBC WITH DIFFERENTIAL/PLATELET
Absolute Lymphocytes: 3425 {cells}/uL (ref 850–3900)
Absolute Monocytes: 891 {cells}/uL (ref 200–950)
Basophils Absolute: 123 {cells}/uL (ref 0–200)
Basophils Relative: 0.9 %
Eosinophils Absolute: 315 {cells}/uL (ref 15–500)
Eosinophils Relative: 2.3 %
HCT: 37.8 % (ref 35.0–45.0)
Hemoglobin: 12.8 g/dL (ref 11.7–15.5)
MCH: 32.1 pg (ref 27.0–33.0)
MCHC: 33.9 g/dL (ref 32.0–36.0)
MCV: 94.7 fL (ref 80.0–100.0)
MPV: 10.7 fL (ref 7.5–12.5)
Monocytes Relative: 6.5 %
Neutro Abs: 8946 {cells}/uL — ABNORMAL HIGH (ref 1500–7800)
Neutrophils Relative %: 65.3 %
Platelets: 371 10*3/uL (ref 140–400)
RBC: 3.99 10*6/uL (ref 3.80–5.10)
RDW: 13.4 % (ref 11.0–15.0)
Total Lymphocyte: 25 %
WBC: 13.7 10*3/uL — ABNORMAL HIGH (ref 3.8–10.8)

## 2023-07-17 LAB — LIPID PANEL
Cholesterol: 141 mg/dL (ref <200)
HDL: 61 mg/dL (ref >=50)
LDL Cholesterol (Calc): 57 mg/dL
Non-HDL Cholesterol (Calc): 80 mg/dL (ref <130)
Total CHOL/HDL Ratio: 2.3 (calc) (ref <5.0)
Triglycerides: 142 mg/dL (ref <150)

## 2023-07-17 LAB — COMPLETE METABOLIC PANEL WITH GFR
AG Ratio: 1.6 (calc) (ref 1.0–2.5)
ALT: 11 U/L (ref 6–29)
AST: 12 U/L (ref 10–35)
Albumin: 4.4 g/dL (ref 3.6–5.1)
Alkaline phosphatase (APISO): 109 U/L (ref 37–153)
BUN: 16 mg/dL (ref 7–25)
CO2: 33 mmol/L — ABNORMAL HIGH (ref 20–32)
Calcium: 9.4 mg/dL (ref 8.6–10.4)
Chloride: 94 mmol/L — ABNORMAL LOW (ref 98–110)
Creat: 0.83 mg/dL (ref 0.60–1.00)
Globulin: 2.7 g/dL (ref 1.9–3.7)
Glucose, Bld: 100 mg/dL — ABNORMAL HIGH (ref 65–99)
Potassium: 3.3 mmol/L — ABNORMAL LOW (ref 3.5–5.3)
Sodium: 140 mmol/L (ref 135–146)
Total Bilirubin: 0.4 mg/dL (ref 0.2–1.2)
Total Protein: 7.1 g/dL (ref 6.1–8.1)
eGFR: 76 mL/min/{1.73_m2} (ref >=60)

## 2023-07-17 LAB — TSH: TSH: 0.72 m[IU]/L (ref 0.40–4.50)

## 2023-07-17 LAB — MICROALBUMIN / CREATININE URINE RATIO
Creatinine, Urine: 27 mg/dL (ref 20–275)
Microalb Creat Ratio: 7 mg/g{creat} (ref <30)
Microalb, Ur: 0.2 mg/dL

## 2023-07-17 LAB — URINALYSIS, ROUTINE W REFLEX MICROSCOPIC
Bilirubin Urine: NEGATIVE
Glucose, UA: NEGATIVE
Hgb urine dipstick: NEGATIVE
Ketones, ur: NEGATIVE
Leukocytes,Ua: NEGATIVE
Nitrite: NEGATIVE
Protein, ur: NEGATIVE
Specific Gravity, Urine: 1.009 (ref 1.001–1.035)
pH: 5.5 (ref 5.0–8.0)

## 2023-07-17 LAB — MAGNESIUM: Magnesium: 2 mg/dL (ref 1.5–2.5)

## 2023-07-17 LAB — VITAMIN D 25 HYDROXY (VIT D DEFICIENCY, FRACTURES): Vit D, 25-Hydroxy: 65 ng/mL (ref 30–100)

## 2023-07-17 LAB — HEMOGLOBIN A1C W/OUT EAG: Hgb A1c MFr Bld: 6.1 %{Hb} — ABNORMAL HIGH (ref <5.7)

## 2023-07-18 ENCOUNTER — Other Ambulatory Visit: Payer: Self-pay

## 2023-07-18 ENCOUNTER — Telehealth: Payer: Self-pay

## 2023-07-18 DIAGNOSIS — I10 Essential (primary) hypertension: Secondary | ICD-10-CM

## 2023-07-18 DIAGNOSIS — R21 Rash and other nonspecific skin eruption: Secondary | ICD-10-CM

## 2023-07-18 MED ORDER — POTASSIUM CHLORIDE CRYS ER 20 MEQ PO TBCR: EXTENDED_RELEASE_TABLET | ORAL | 3 refills | Status: DC

## 2023-07-18 MED ORDER — AZITHROMYCIN 250 MG PO TABS: ORAL_TABLET | ORAL | 1 refills | Status: AC

## 2023-07-18 NOTE — Telephone Encounter (Signed)
Her lab work stated if she had any signs of infection to let you know. She says she is having sinus pressure under her eyes, drainage that has a greenish color. She feels like always keeps a sinus infection. Also, she is taking Klor-Con three times a day. If her potassium is still low, what does she need to do?

## 2023-07-18 NOTE — Telephone Encounter (Signed)
Increase potassium to 2 caps twice a day. Please have her schedule an appointment to check for sinus infection

## 2023-08-01 ENCOUNTER — Other Ambulatory Visit: Payer: Self-pay | Admitting: Nurse Practitioner

## 2023-08-01 DIAGNOSIS — E079 Disorder of thyroid, unspecified: Secondary | ICD-10-CM

## 2023-08-12 ENCOUNTER — Other Ambulatory Visit: Payer: Self-pay | Admitting: Internal Medicine

## 2023-08-12 DIAGNOSIS — I10 Essential (primary) hypertension: Secondary | ICD-10-CM

## 2023-09-24 ENCOUNTER — Other Ambulatory Visit: Payer: Self-pay

## 2023-09-24 MED ORDER — PANTOPRAZOLE SODIUM 40 MG PO TBEC: DELAYED_RELEASE_TABLET | ORAL | 1 refills | Status: AC

## 2023-09-24 MED ORDER — ATORVASTATIN CALCIUM 40 MG PO TABS: ORAL_TABLET | ORAL | 3 refills | Status: AC

## 2024-01-09 ENCOUNTER — Ambulatory Visit: Payer: Medicare Other | Admitting: Nurse Practitioner

## 2024-05-15 ENCOUNTER — Encounter: Payer: Medicare Other | Admitting: Nurse Practitioner

## 2024-06-02 ENCOUNTER — Encounter: Payer: Medicare Other | Admitting: Nurse Practitioner

## 2024-06-06 ENCOUNTER — Encounter: Payer: Medicare Other | Admitting: Nurse Practitioner

## 2024-06-25 ENCOUNTER — Encounter: Payer: Medicare Other | Admitting: Nurse Practitioner

## 2024-07-10 ENCOUNTER — Encounter: Payer: Medicare Other | Admitting: Nurse Practitioner

## 2024-07-15 ENCOUNTER — Encounter: Payer: Medicare Other | Admitting: Nurse Practitioner
